# Patient Record
Sex: Female | Born: 1950
Health system: Southern US, Community
[De-identification: ages and names within clinical notes are randomized; demographics above are authoritative.]

## PROBLEM LIST (undated history)

## (undated) DIAGNOSIS — D259 Leiomyoma of uterus, unspecified: Secondary | ICD-10-CM

## (undated) DIAGNOSIS — J45909 Unspecified asthma, uncomplicated: Secondary | ICD-10-CM

## (undated) DIAGNOSIS — D649 Anemia, unspecified: Secondary | ICD-10-CM

## (undated) DIAGNOSIS — N189 Chronic kidney disease, unspecified: Secondary | ICD-10-CM

## (undated) DIAGNOSIS — J45991 Cough variant asthma: Secondary | ICD-10-CM

## (undated) DIAGNOSIS — E119 Type 2 diabetes mellitus without complications: Secondary | ICD-10-CM

## (undated) DIAGNOSIS — M2040 Other hammer toe(s) (acquired), unspecified foot: Secondary | ICD-10-CM

## (undated) HISTORY — DX: Cough variant asthma: J45.991

## (undated) HISTORY — DX: Type 2 diabetes mellitus without complications: E11.9

## (undated) HISTORY — DX: Other hammer toe(s) (acquired), unspecified foot: M20.40

## (undated) HISTORY — DX: Leiomyoma of uterus, unspecified: D25.9

## (undated) HISTORY — DX: Unspecified asthma, uncomplicated: J45.909

## (undated) HISTORY — PX: UTERINE FIBROID SURGERY: SHX826

## (undated) HISTORY — PX: SALIVARY GLAND SURGERY: SHX768

---

## 2009-08-19 ENCOUNTER — Ambulatory Visit: Payer: Self-pay | Admitting: Family Medicine

## 2009-08-19 DIAGNOSIS — R198 Other specified symptoms and signs involving the digestive system and abdomen: Secondary | ICD-10-CM

## 2009-08-19 DIAGNOSIS — L80 Vitiligo: Secondary | ICD-10-CM

## 2009-08-19 DIAGNOSIS — R5383 Other fatigue: Secondary | ICD-10-CM

## 2009-08-19 DIAGNOSIS — R42 Dizziness and giddiness: Secondary | ICD-10-CM | POA: Insufficient documentation

## 2009-08-19 DIAGNOSIS — D492 Neoplasm of unspecified behavior of bone, soft tissue, and skin: Secondary | ICD-10-CM

## 2009-08-19 DIAGNOSIS — IMO0001 Reserved for inherently not codable concepts without codable children: Secondary | ICD-10-CM

## 2009-08-19 DIAGNOSIS — E119 Type 2 diabetes mellitus without complications: Secondary | ICD-10-CM | POA: Insufficient documentation

## 2009-08-19 DIAGNOSIS — R5381 Other malaise: Secondary | ICD-10-CM

## 2009-08-24 ENCOUNTER — Encounter: Payer: Self-pay | Admitting: Physician Assistant

## 2009-08-27 ENCOUNTER — Telehealth: Payer: Self-pay | Admitting: Physician Assistant

## 2009-08-27 LAB — CONVERTED CEMR LAB
Anti Nuclear Antibody(ANA): POSITIVE — AB
BUN: 18 mg/dL (ref 6–23)
CO2: 22 meq/L (ref 19–32)
Calcium: 9.9 mg/dL (ref 8.4–10.5)
Chloride: 110 meq/L (ref 96–112)
Creatinine, Ser: 1.08 mg/dL (ref 0.40–1.20)
Glucose, Bld: 81 mg/dL (ref 70–99)
HDL: 33 mg/dL — ABNORMAL LOW (ref >39)
LDL Cholesterol: 70 mg/dL (ref 0–99)
MCV: 96.9 fL (ref 78.0–100.0)
Platelets: 219 10*3/uL (ref 150–400)
Sodium: 145 meq/L (ref 135–145)
Total Bilirubin: 0.4 mg/dL (ref 0.3–1.2)
Total Protein: 7.5 g/dL (ref 6.0–8.3)
WBC: 5.2 10*3/uL (ref 4.0–10.5)

## 2009-09-01 ENCOUNTER — Telehealth: Payer: Self-pay | Admitting: Family Medicine

## 2009-09-07 ENCOUNTER — Telehealth: Payer: Self-pay | Admitting: Family Medicine

## 2009-09-08 ENCOUNTER — Telehealth: Payer: Self-pay | Admitting: Family Medicine

## 2009-09-08 ENCOUNTER — Encounter: Payer: Self-pay | Admitting: Family Medicine

## 2009-09-14 ENCOUNTER — Ambulatory Visit (HOSPITAL_COMMUNITY): Admission: RE | Admit: 2009-09-14 | Discharge: 2009-09-14 | Payer: Self-pay | Admitting: Family Medicine

## 2009-09-15 ENCOUNTER — Telehealth: Payer: Self-pay | Admitting: Physician Assistant

## 2009-09-15 ENCOUNTER — Encounter: Payer: Self-pay | Admitting: Physician Assistant

## 2009-09-20 ENCOUNTER — Ambulatory Visit (HOSPITAL_COMMUNITY): Admission: RE | Admit: 2009-09-20 | Discharge: 2009-09-20 | Payer: Self-pay | Admitting: Family Medicine

## 2009-09-20 ENCOUNTER — Encounter: Payer: Self-pay | Admitting: Physician Assistant

## 2009-09-23 ENCOUNTER — Telehealth: Payer: Self-pay | Admitting: Family Medicine

## 2009-09-24 ENCOUNTER — Telehealth: Payer: Self-pay | Admitting: Physician Assistant

## 2009-09-25 ENCOUNTER — Telehealth: Payer: Self-pay | Admitting: Physician Assistant

## 2009-09-28 ENCOUNTER — Encounter: Payer: Self-pay | Admitting: Family Medicine

## 2010-01-28 ENCOUNTER — Encounter: Payer: Self-pay | Admitting: Family Medicine

## 2010-03-13 ENCOUNTER — Encounter: Payer: Self-pay | Admitting: Family Medicine

## 2010-03-22 NOTE — Letter (Signed)
Summary: Mri of pelvis  Mri of pelvis   Imported By: Lenn Cal 09/28/2009 16:41:43  _____________________________________________________________________  External Attachment:    Type:   Image     Comment:   External Document

## 2010-03-22 NOTE — Progress Notes (Signed)
Summary: speak with PA  Phone Note Call from Patient   Summary of Call: pt would like to speak with [place]. [id number] Initial call taken by: Lenn Cal,  September 15, 2009 2:24 PM  Follow-up for Phone Call        Pt inquiring why only 2 pages of her information were sent to radiology.  Advised pt that I sent the pathology information because this would be the most helpful, and that I didnt feel that the office notes, etc would be pertinent to the radiologist.  Pt voices that she understood. Follow-up by: Kennith Gain PA,  September 15, 2009 3:45 PM

## 2010-03-22 NOTE — Progress Notes (Signed)
  Phone Note Call from Patient   Summary of Call: This is the patient that Andrea Leblanc has discharged but she was wanting to know if the MRI showed what the lumps above her tailbone might be. Larene Beach and I read the letter to her that [facility] sent regarding the hematomas in her hips but she was wondering about the tailbone places. I didn't see anything about those.  I advised that Arrie Aran would be out until Monday. She was very emotional and crying so I told her I would ask you and see if you knew and call her back. Initial call taken by: Kate Sable LPN,  August  4, 624THL 1:46 PM  Follow-up for Phone Call        the letter sent describes what was seen on the mRI, nothing further to add Follow-up by: Tula Nakayama MD,  September 23, 2009 4:51 PM

## 2010-03-22 NOTE — Progress Notes (Signed)
Summary: referral  Phone Note Call from Patient   Caller: Patient Summary of Call: patient called asking about MRI- states she did not recieve call to tell her it was denied advised patient we were under the impression that she was not coming back to the practice and had been looking for another Dr.  wants call from referal coordinator re this matter  I was able to tell her it was denied, i remember the fax but no copy in chart or notation of call Initial call taken by: Baldomero Lamy LPN,  July 19, 624THL [id number] AM  Follow-up for Phone Call        patient called again, stating that our office needs to call and appeal the mri denial and she wants this done TODAY Follow-up by: Baldomero Lamy LPN,  July 19, 624THL 579FGE PM  Additional Follow-up for Phone Call Additional follow up Details #1::        pls try and follow through on this tomorrow, pls get me on the phone with the insurance company to tryto pA  Additional Follow-up by: Tula Nakayama MD,  September 07, 2009 4:43 PM    Additional Follow-up for Phone Call Additional follow up Details #2::    pt has appt for [place] of hip. She is aware of this.  Follow-up by: Lenn Cal,  September 08, 2009 4:01 PM

## 2010-03-22 NOTE — Progress Notes (Signed)
Summary: please advise  Phone Note Call from Patient   Summary of Call: patient wants to know about the places on her tail bone, she recieved the other letter that states what is going on with her hip, but no mention of the places on her tail bone.  Please advise, she wants a copy of the actual report not just the letter.  she is very upset and states she called yesterday and we talked to her, she is still questioning about the place above her tail bone.  She states she showed these 2 areas to Kennith Gain, on June 30, she states she had to have a sonogram and that the place above her tail bone wasn't mentioned, she went back on Monday to have the MRI.  Please advise, she wants to know where she can get a copy of the report.  she has another doctors appt at 2 today,  she wants a copy of the report to take with her.  She said that she has proof on two occasions where we never mentioned the place above her tail bone  Initial call taken by: Eliezer Mccoy,  September 24, 2009 11:31 AM  Follow-up for Phone Call        [place] appt with ortho in [place], pt wants records of mRI sent to the rheumatologist today.  She at this time is refusing the orthopedic appt, states she is seeing Dr Wolfgang Phoenix next week and wants to sign and have her recordrecords to both places   pt repeatedly complains of service received here Follow-up by: Tula Nakayama MD,  September 24, 2009 12:41 PM  Additional Follow-up for Phone Call Additional follow up Details #1::        Pt should have received a copy of her MRI report with the letter regarding her results.  Additional Follow-up by: Kennith Gain PA,  September 27, 2009 11:08 AM    Additional Follow-up for Phone Call Additional follow up Details #2::    we will wait another call from her, and whenever ewe get a letter requesting records to be sent /fax from an office we will send Follow-up by: Tula Nakayama MD,  September 27, 2009 11:29 AM

## 2010-03-22 NOTE — Progress Notes (Signed)
Summary: referrals  Phone Note Other Incoming   Caller: [facility] Summary of Call: call from doctor reviewing case, recommend ultrasound ogf the mases first on the hip before mRI, also if question is2 episodes of vertigo then therapy for vertigo before furthe eval, if acoutic neuroma, then audiometry recommended Initial call taken by: Tula Nakayama MD,  September 08, 2009 11:11 AM  Follow-up for Phone Call        pls order ultrasound of hip to evaluate masses, cancel mRI, let pt know this has been reviewed with doctor with herinsurance.  pls also let her know therapy forthe verigo is recommended before getting a brain scan as well as evaluation by a hearing specialist, plsrefer her to physical therapy at [facility] eval; and treat vertigo twice weekly for 3 weeks , and also to aaudiology for evel fopr possible acoustic neuroma Follow-up by: Tula Nakayama MD,  September 08, 2009 11:13 AM  Additional Follow-up for Phone Call Additional follow up Details #1::        pt has appt at [facility] for 09/14/2009 9:00. pt notified also pt doesn't wont to do referrals to physical therpy are audiology. Said she has already went to ent and there was nothing wrong. and she stated she has only had vertigo twice here and no need to do that. but she is having the [place].  Additional Follow-up by: Lenn Cal,  September 08, 2009 1:43 PM

## 2010-03-22 NOTE — Progress Notes (Signed)
Summary: lab  Phone Note Call from Patient   Summary of Call: wants results on her lab work call back at [phone] Initial call taken by: Dierdre Harness,  August 27, 2009 10:01 AM  Follow-up for Phone Call        Voice mail.  Left msg that I would call back before I leave for the day. Follow-up by: Kennith Gain PA,  August 27, 2009 11:27 AM  Additional Follow-up for Phone Call Additional follow up Details #1::        Discussed results with pt. She has requested copies of her labs "with comments."  I will have these mailed to her. Will also refer pt to Rheumatologist. Additional Follow-up by: Kennith Gain PA,  August 27, 2009 11:51 AM

## 2010-03-22 NOTE — Letter (Signed)
Summary: MEDICAL RELEASE  MEDICAL RELEASE   Imported By: Dierdre Harness 01/28/2010 11:02:44  _____________________________________________________________________  External Attachment:    Type:   Image     Comment:   External Document

## 2010-03-22 NOTE — Progress Notes (Signed)
  Phone Note Outgoing Call   Call placed by: dr Glessie Eustice Summary of Call: pls contact thios pt and let her knoww  Follow-up for Phone Call        Pls provide this pt withalost of the doctors in [place] or [place] ,  of the internists, she was askingit be sent by [employer], but i do not know about thatsister in law sent a fax and this may be our best option if the pt agrees I treied giving her the names on the phone she wanted something written, we can also mail to her home prob the best Follow-up by: Tula Nakayama MD,  September 01, 2009 2:20 PM  Additional Follow-up for Phone Call Additional follow up Details #1::        mailed list of dr's in [place] with telephone numbers patient aware Additional Follow-up by: Baldomero Lamy LPN,  July 13, 624THL [id number] PM

## 2010-03-22 NOTE — Letter (Signed)
Summary: Discharge Letter  [facility]  [address]   [place], [place] [address]   Phone: [phone]  Fax: [phone]     September 15, 2009 MRN: [id number]   DEONNA HEFFELFINGER [place], [place]  [id number]   Dear Ms. Rosey Bath,  I find it necessary to inform you that I will not be able to provide medical care to you, because of incompatibility of personalities.  You have already expressed interest in finding another primary health care provider, and I feel that we are unable to effectively create a provider-patient relationship.                                                                                             Since your condition requires medical attention, I suggest that you place your self under the care of another physician without delay. If you desire, I will be available for emergency care for 30 days after you receive this letter.  This should give you ample time to select a physician of your choice from the many competent providers in this area. You may want to call the local medical society or [facility]'s physician referral service [phone]) for their assistance in locating a new physician. With your written authorization, I will make a copy of your medical record available to your new physician.   Sincerely,    Kennith Gain PA-C

## 2010-03-22 NOTE — Letter (Signed)
Summary: Laboratory/X-Ray Results  [facility]  [address]   [place], [place] [address]   Phone: [phone]  Fax: [phone]    Lab/X-Ray Results  September 20, 2009  MRN: [id number]  Andrea Leblanc [address] [place], [place]  [id number]    The results of your recent lab/x-ray has been reviewed and were found:  I have enclosed a copy of your MRI results.  As you will see the mass in your hip is a hematoma.  This is a deep bruise.  This will resolve on its own, but can take months. The MRI also shows that you have arthritis in your hips, as well as muscle inflammation or small tears.  This may be due to your connective tissue disorder.  We have referred you to an orthopedic physician for consultation.      Kennith Gain PA

## 2010-03-22 NOTE — Progress Notes (Signed)
  Phone Note Other Incoming   Caller: dr simpson Summary of Call: pt called on 09/24/2009. I spoke woith her directly, and she states that she does not want to be referred to ortho, sh will wait until she sees Dr Sharlee Blew, who she is sched to see one day next week, so pLS ensure that the referral /request you made referring her to ortho is cancelled Initial call taken by: Tula Nakayama MD,  September 25, 2009 11:23 PM  Follow-up for Phone Call        I checked, and the referral had not been done yet. Advised referral coordinator to disregard referral  per pts request. Follow-up by: Kennith Gain PA,  September 27, 2009 11:17 AM

## 2010-03-22 NOTE — Assessment & Plan Note (Signed)
Summary: NEW PATIENT- room 1   Vital Signs:  Patient profile:   60 year old female Height:      63.75 inches Weight:      147.25 pounds BMI:     25.57 O2 Sat:      100 % on Room air Pulse rate:   117 / minute Resp:     16 per minute BP sitting:   112 / 60  (left arm)  Vitals Entered By: Baldomero Lamy LPN (June 30, 624THL 579FGE PM) CC: new patient Is Patient Diabetic? No   CC:  new patient.  History of Present Illness: New pt here to establish care with new PCP.  Relocated to the area from [place] in March of this yr.  Originally from [place].  Is living with her brother and his wife. Presents today with multiple complaints and hx since 2007.  She states she has seen at least 10 Drs and  "needs someone who will listen to me and figure out what is wrong." Saw 2 different rheumatologist in [place].  Pt states she will not tell me what they thought was her dx.  She refuses to tell me. "I cannot accept it."  STates she did have an elevated ANA but when repeated had improved.  Cough variant Asthma.  Treated with Aciphex. DM but improved with diet and wt loss of 100 lbs.  She periodically checks her blood sugar to monitor.  Numbness hands and feet. Vertigo - intermittent.  Has had 2 episodes recently.  Room spinning.  Has to stay in bed because of severity. Change in skin color in rashes. Decreased appetite and digestive issues. Body aches and pains. Constant vag dischg.  Periodic swelling in feet and ankles. Fatigue Occ twitching in lip. New lump in Lt hip and also in buttock area. +TTP. No trauma.  No skin discoloration.   Current Medications (verified): 1)  Piroxicam 20 Mg Caps (Piroxicam) .... One Cap By Mouth Once Daily 2)  Klor-Con 10 10 Meq Cr-Tabs (Potassium Chloride) .... One Tab By Mouth Once Daily 3)  Prednisone 5 Mg Tabs (Prednisone) .... One Tab By Mouth Once Daily 4)  Furosemide 20 Mg Tabs (Furosemide) .... One Tab By Mouth Once Daily 5)  Vitamin D3 1000 Unit Caps  (Cholecalciferol) .... One Cap By Mouth Once Daily  Allergies (verified): No Known Drug Allergies  Past History:  Past medical, surgical, family and social histories (including risk factors) reviewed for relevance to current acute and chronic problems.  Past Medical History: Diabetes mellitus, type II Cough variant asthma Vertigo Skin discoloration  Past Surgical History: Lumpectomy under left ear- benign 2007 Fibroids removed- 2007  Family History: Reviewed history and no changes required. Mother living- healthy Father - health status unknown One brother- healthy One sister deceased-   Social History: Reviewed history and no changes required. Unemployed Divorced No children Never Smoked Alcohol use-no Drug use-no Regular exercise-no Smoking Status:  never Drug Use:  no Does Patient Exercise:  no  Review of Systems General:  Complains of fatigue and loss of appetite; denies chills. ENT:  Denies earache, nasal congestion, ringing in ears, and sore throat. CV:  Denies chest pain or discomfort and palpitations. Resp:  Complains of shortness of breath; denies cough and wheezing; FEELS LIKE DOESNT GET A FULL BREATH, LIKE HER LUNGS DONT EXPAND ALL THE WAY. GI:  Complains of change in bowel habits, constipation, diarrhea, and loss of appetite. MS:  Complains of muscle aches and muscle weakness. Derm:  Complains of changes in color of skin, dryness, lesion(s), poor wound healing, and rash. Neuro:  Complains of numbness and tingling.  Physical Exam  General:  Well-developed,well-nourished,in no acute distress; alert,appropriate and cooperative throughout examination Head:  Normocephalic and atraumatic without obvious abnormalities. No apparent alopecia or balding. Ears:  External ear exam shows no significant lesions or deformities.  Otoscopic examination reveals clear canals, tympanic membranes are intact bilaterally without bulging, retraction, inflammation or discharge.  Hearing is grossly normal bilaterally. Nose:  External nasal examination shows no deformity or inflammation. Nasal mucosa are pink and moist without lesions or exudates. Mouth:  Oral mucosa and oropharynx without lesions or exudates.  Teeth in good repair. Neck:  No deformities, masses, or tenderness noted.no thyromegaly and no thyroid nodules or tenderness.   Lungs:  Normal respiratory effort, chest expands symmetrically. Lungs are clear to auscultation, no crackles or wheezes. Heart:  Normal rate and regular rhythm. S1 and S2 normal without gallop, murmur, click, rub or other extra sounds. Abdomen:  Bowel sounds positive,abdomen soft and non-tender without masses, organomegaly or hernias noted. Msk:  approx 2 inch mass, firm, mobile posterolateral Lt hip.  2 smaller similar masses noted buttocks at superior gluteal cleft. Extremities:  No clubbing, cyanosis, edema, or deformity noted with normal full range of motion of all joints.   Neurologic:  alert & oriented X3, strength normal in all extremities, gait normal, and DTRs symmetrical and normal.   Skin:  hypopigmentation noted  Cervical Nodes:  No lymphadenopathy noted Psych:  Cognition and judgment appear intact. Alert and cooperative with normal attention span and concentration. No apparent delusions, illusions, hallucinations   Impression & Recommendations:  Problem # 1:  VERTIGO (ICD-780.4) Assessment New  Her updated medication list for this problem includes:    Antivert 25 Mg Tabs (Meclizine hcl) .Marland Kitchen... Take 1 every 8 hrs as needed for dizziness  Orders: Miscellaneous Other Radiology (Misc Other Rad) - MRI Brain and 8th cranial nerve  Problem # 2:  MYALGIA (ICD-729.1) Assessment: Deteriorated  Her updated medication list for this problem includes:    Piroxicam 20 Mg Caps (Piroxicam) ..... One cap by mouth once daily  Orders: Antinuclear Antib (ANA) [phone]) T-Antinuclear Antib (ANA) [phone])  Problem # 3:  HIP  MASS (ICD-239.2) Assessment: New  Orders: T-MRI Lower extremity other than joint w/o contrast [id number])  Problem # 4:  FATIGUE (ICD-780.79) Assessment: Deteriorated  Orders: T-CBC No Diff [id number]) T-TSH [id number])  Problem # 5:  DIABETES MELLITUS, TYPE II (ICD-250.00) Assessment: Improved  Orders: T-CMP with estimated GFR ([phone]) T- Hemoglobin A1C [id number])  Complete Medication List: 1)  Piroxicam 20 Mg Caps (Piroxicam) .... One cap by mouth once daily 2)  Klor-con 10 10 Meq Cr-tabs (Potassium chloride) .... One tab by mouth once daily 3)  Prednisone 5 Mg Tabs (Prednisone) .... One tab by mouth once daily 4)  Furosemide 20 Mg Tabs (Furosemide) .... One tab by mouth once daily 5)  Vitamin D3 1000 Unit Caps (Cholecalciferol) .... One cap by mouth once daily 6)  Antivert 25 Mg Tabs (Meclizine hcl) .... Take 1 every 8 hrs as needed for dizziness  Other Orders: T-Lipid Profile [id number]) Gastroenterology Referral (GI) Dermatology Referral (Derma)  Patient Instructions: 1)  Please schedule a follow-up appointment in 1 month. 2)  I have ordered blood work for you.  Please have this drawn fasting. 3)  I will order a MRI of your head and of the mass of your Lt hip. 4)  I have referred  you to a dermatologist for evaluation of your skin. 5)  I will review your records and then refer you to a rhematologist. 6)  I have prescribed Antivert for you to use as needed for dizziness. Prescriptions: ANTIVERT 25 MG TABS (MECLIZINE HCL) take 1 every 8 hrs as needed for dizziness  #30 x 0   Entered and Authorized by:   Kennith Gain PA   Signed by:   Kennith Gain PA on 08/19/2009   Method used:   Electronically to        [facility] (retail)       [address]       [place], [place]  [address]       Ph: [phone]       Fax: [phone]   RxID:   [id number]

## 2011-03-07 ENCOUNTER — Other Ambulatory Visit: Payer: Self-pay | Admitting: Family Medicine

## 2011-03-07 DIAGNOSIS — Z139 Encounter for screening, unspecified: Secondary | ICD-10-CM

## 2011-03-10 ENCOUNTER — Ambulatory Visit (HOSPITAL_COMMUNITY)
Admission: RE | Admit: 2011-03-10 | Discharge: 2011-03-10 | Disposition: A | Discharge status: Home / Self Care | Payer: PRIVATE HEALTH INSURANCE | Source: Ambulatory Visit | Attending: Family Medicine | Admitting: Family Medicine

## 2011-03-10 DIAGNOSIS — M949 Disorder of cartilage, unspecified: Secondary | ICD-10-CM | POA: Insufficient documentation

## 2011-03-10 DIAGNOSIS — M899 Disorder of bone, unspecified: Secondary | ICD-10-CM | POA: Insufficient documentation

## 2011-03-10 DIAGNOSIS — Z139 Encounter for screening, unspecified: Secondary | ICD-10-CM

## 2011-03-10 DIAGNOSIS — Z780 Asymptomatic menopausal state: Secondary | ICD-10-CM | POA: Insufficient documentation

## 2011-03-10 DIAGNOSIS — Z1231 Encounter for screening mammogram for malignant neoplasm of breast: Secondary | ICD-10-CM | POA: Insufficient documentation

## 2011-06-14 ENCOUNTER — Other Ambulatory Visit: Payer: Self-pay | Admitting: Neurology

## 2011-06-14 DIAGNOSIS — R202 Paresthesia of skin: Secondary | ICD-10-CM

## 2011-06-14 DIAGNOSIS — R42 Dizziness and giddiness: Secondary | ICD-10-CM

## 2011-06-21 ENCOUNTER — Ambulatory Visit (HOSPITAL_COMMUNITY)
Admission: RE | Admit: 2011-06-21 | Discharge: 2011-06-21 | Disposition: A | Discharge status: Home / Self Care | Payer: PRIVATE HEALTH INSURANCE | Source: Ambulatory Visit | Attending: Neurology | Admitting: Neurology

## 2011-06-21 DIAGNOSIS — R262 Difficulty in walking, not elsewhere classified: Secondary | ICD-10-CM | POA: Insufficient documentation

## 2011-06-21 DIAGNOSIS — R42 Dizziness and giddiness: Secondary | ICD-10-CM | POA: Insufficient documentation

## 2011-06-21 DIAGNOSIS — R209 Unspecified disturbances of skin sensation: Secondary | ICD-10-CM | POA: Insufficient documentation

## 2011-06-21 DIAGNOSIS — R200 Anesthesia of skin: Secondary | ICD-10-CM

## 2012-05-13 ENCOUNTER — Other Ambulatory Visit: Payer: Self-pay

## 2012-05-13 MED ORDER — TRAMADOL HCL 50 MG PO TABS: ORAL_TABLET | ORAL | Status: DC

## 2012-09-12 ENCOUNTER — Telehealth: Payer: Self-pay | Admitting: Family Medicine

## 2012-09-12 MED ORDER — TRAMADOL HCL 50 MG PO TABS: ORAL_TABLET | ORAL | Status: DC

## 2012-09-12 NOTE — Telephone Encounter (Signed)
Refill times one, needs follow up ov before another refill(after this one)

## 2012-09-12 NOTE — Telephone Encounter (Signed)
Patient needs Rx for tramadol 50 mg to [facility] in [place]

## 2012-09-12 NOTE — Telephone Encounter (Signed)
Med called into pharm. Pt notified on voicemail

## 2012-09-20 ENCOUNTER — Encounter: Payer: Self-pay | Admitting: Family Medicine

## 2012-09-20 ENCOUNTER — Ambulatory Visit (INDEPENDENT_AMBULATORY_CARE_PROVIDER_SITE_OTHER): Payer: Medicare Other | Admitting: Family Medicine

## 2012-09-20 VITALS — BP 132/90 | Temp 98.3°F | Wt 183.8 lb

## 2012-09-20 DIAGNOSIS — R21 Rash and other nonspecific skin eruption: Secondary | ICD-10-CM

## 2012-09-20 DIAGNOSIS — G894 Chronic pain syndrome: Secondary | ICD-10-CM

## 2012-09-20 DIAGNOSIS — E119 Type 2 diabetes mellitus without complications: Secondary | ICD-10-CM

## 2012-09-20 LAB — POCT GLYCOSYLATED HEMOGLOBIN (HGB A1C): Hemoglobin A1C: 6

## 2012-09-20 MED ORDER — MOMETASONE FUROATE 0.1 % EX CREA: TOPICAL_CREAM | CUTANEOUS | Status: DC

## 2012-09-20 MED ORDER — TRAMADOL HCL 50 MG PO TABS: ORAL_TABLET | ORAL | Status: DC

## 2012-09-20 NOTE — Progress Notes (Signed)
  Subjective:    Patient ID: Andrea Leblanc, female    DOB: 26-Jul-1950, 62 y.o.   MRN: [id number]  HPI Uses the tramdol twice per day, takes 2 tablets twice per day.  Patient states that she has never had diabetes. When she first presented she stated that she had diabetes. Her sugar numbers were apparently elevated while she was on steroids.  The patient goes into great length today once again on her disappointment with virtually every doctor that she has ever seen. She is going to yet another specialist soon. She does have significant difficulties with chronic arthritis and may or may not have a true autoimmune inflammatory disease. She has been told in the past that she has possible lupus. She has also been told in the past that she does not have lupus. Understandably she is frustrated about this.  Patient also notes rash Review of Systems    ROS otherwise negative Objective:   Physical Exam Alert no acute distress. Lungs clear. Heart regular rate and rhythm. HEENT normal. Face arms hypertrophic patchy rash.       Assessment & Plan:  Impression chronic rheumatological problem with no specific diagnosis from rheumatologist and extreme ongoing frustration on the part of the patient because of this #2 history of elevated sugar but no diabetes per patient #3 rash nonspecific but may be related to #1 #4 chronic pain discussed plan medications refilled. Add Elocon. Patient encouraged to get on and see a new rheumatologist at [facility]. Also encouraged to get wellness exam soon. WSL

## 2012-09-21 DIAGNOSIS — G894 Chronic pain syndrome: Secondary | ICD-10-CM | POA: Insufficient documentation

## 2012-10-25 ENCOUNTER — Telehealth: Payer: Self-pay | Admitting: Family Medicine

## 2012-10-25 NOTE — Telephone Encounter (Signed)
Would like an exemption letter for [employer].  States she will not be able to attend jury duty due to health reason.  Needs this letter before Monday November 11, 2012.  Patient has a deadline to return this exemption to the [employer].  Please call Patient. Thanks

## 2012-11-01 NOTE — Telephone Encounter (Signed)
Do we know if this has been completed?  May I close the encounter?

## 2012-11-07 NOTE — Telephone Encounter (Signed)
Letter mailed 11/07/2012

## 2012-11-28 ENCOUNTER — Encounter: Payer: Medicare Other | Admitting: Nurse Practitioner

## 2012-12-10 ENCOUNTER — Telehealth: Payer: Self-pay | Admitting: Family Medicine

## 2012-12-10 NOTE — Telephone Encounter (Signed)
Pt is often compromised, somewhat angry, and scattered in her requests when I interact with her. Nice lady, but appears somewhat compromised in her thought processies. When I last saw, she mentioned she'd be seeing a specialist at [facility] that's all . Have beth see this and perhaps call her

## 2012-12-10 NOTE — Telephone Encounter (Signed)
Patient called wanting her A1C information from august1  And any other results from last year. I gave them to her.She wanted to know if we send out letters on this I told her I would find out. I addressed this with the nurse's they told me we dont the result are given when patient is here.Then she ask me about records going to [facility] I tried to explain to her I never received a release on this so couldn't send records where I dont know where they are going. Patient got very upset and wanting to know why this wasn't done. I explained if i dont get the request and cant mail or fax records any where. She got mad and started yelling I whant to speak to the doctor now.I explained I would send back a message to you but I cant fax/mail request that I dont get and I want here that afternoon she checked out.

## 2012-12-19 ENCOUNTER — Telehealth: Payer: Self-pay | Admitting: Family Medicine

## 2012-12-19 MED ORDER — TRAMADOL HCL 50 MG PO TABS: ORAL_TABLET | ORAL | Status: DC

## 2012-12-19 NOTE — Telephone Encounter (Signed)
Rx faxed to [facility]. Patient notified.

## 2012-12-19 NOTE — Telephone Encounter (Signed)
Patient states she is out of traMADol (ULTRAM) 50 MG tablet.  Called [facility] in [place], [place] to get this filled and was informed she did not have a prescription for this on file.  However after talking further with the patient she only went through the Automated system.   Call patient to discuss.  She was going to call [facility] and speak with a person to see if this prescription is on "File".  She was trying to refill under the previous RX number from another physician.

## 2012-12-19 NOTE — Telephone Encounter (Signed)
2 tabs twice a day #120

## 2012-12-19 NOTE — Telephone Encounter (Signed)
Please verify usage, number per day

## 2012-12-19 NOTE — Telephone Encounter (Signed)
May have 3 refills 

## 2012-12-20 ENCOUNTER — Telehealth: Payer: Self-pay | Admitting: Family Medicine

## 2012-12-20 NOTE — Telephone Encounter (Signed)
Notified patient that if her condition has deteriorated and she felt she needed different pain meds that she would need a office visit for evaluation and discussion-Patient said ok and hung up.

## 2012-12-20 NOTE — Telephone Encounter (Signed)
Pt wants to know if she can have a different pain med instead of the Tramadol? She is now stating that the Tramadol does not really help her pain. She states that one of her Drs from the past had issued her prednisone for her pain and she states that she felt at her best then. She says she is not saying she needs prednisone, she is just giving you info. She feels her condition has deteriorated to a point that she is now using a walker, or can't even get out of bed due to the pain in her legs.

## 2013-04-23 ENCOUNTER — Ambulatory Visit: Payer: Medicare Other | Admitting: Podiatry

## 2013-04-23 ENCOUNTER — Encounter: Payer: Self-pay | Admitting: Podiatry

## 2013-04-23 ENCOUNTER — Ambulatory Visit (INDEPENDENT_AMBULATORY_CARE_PROVIDER_SITE_OTHER): Payer: Medicare HMO | Admitting: Podiatry

## 2013-04-23 VITALS — BP 147/97 | HR 100 | Resp 12

## 2013-04-23 DIAGNOSIS — L84 Corns and callosities: Secondary | ICD-10-CM

## 2013-04-23 DIAGNOSIS — R52 Pain, unspecified: Secondary | ICD-10-CM

## 2013-04-23 DIAGNOSIS — M2040 Other hammer toe(s) (acquired), unspecified foot: Secondary | ICD-10-CM

## 2013-04-23 NOTE — Patient Instructions (Signed)
Wear soft or cut out shoe over the second toe on the left foot. Apply triple antibiotic ointment and nonmedicated corn pad to the second left toe and attach with one-inch Coflex tape. Do not over tighten Coflex tape. Continue this procedure until the toe heals.

## 2013-04-23 NOTE — Progress Notes (Signed)
   Subjective:    Patient ID: Andrea Leblanc, female    DOB: [birth year], 63 y.o.   MRN: [id number]  HPI '' B/L TOES HAVE NUMBNESS FEELING AND CONSTANT JERKING FOR 3 MONTHS. THE SYMPTOMS HAVE DIMINISHED SLIGHTLY AND IT AGGRAVATED WHEN SITTING OR WALKING. ALSO, LT FOOT 2nd  TOE IS SORE, ESPECIALLY WEARING SHOES AND IS NOT GETTING BETTER.  This patient states that she's been to [facility] recently under the evaluation of multiple doctors for rather diffuse undiagnosed bilaterally pain, without a specific diagnosis. Her primary concern today is the inability to tolerate a closed shoe overlying the second left toe.       Review of Systems  Musculoskeletal: Positive for gait problem.  Neurological: Positive for numbness.  All other systems reviewed and are negative.       Objective:   Physical Exam A 63 year old black female appears orientated x3  Vascular: The DP and PT pulses 2/4 bilaterally  Neurological: Sensation to 10 g monofilament wire intact 5/5 bilaterally. Vibratory sensation intact bilaterally.  Dermatological: A inflamed hyperkeratotic tissue noted over the dorsal proximal interphalangeal joint second left toe which is very tender to pressure. (After debridement hyperkeratotic tissue on the second left toe a granular base is noted without any active drainage, erythema, malodor).  Musculoskeletal: Semirigid hammer second digit left       Assessment & Plan:   Assessment: Undetermined cause of patient's numbness. Patient is under evaluation by multiple doctors at [facility] for this complaint  Hammertoe deformity second digit left with associated inflammatory reactive area on the dorsal proximal interphalangeal joint.  Plan: Patient advised to continue further evaluation for her generalized numbness and bilaterally pain with her Evangeline.  The hyperkeratotic tissue in the second left toe was debrided back and antibiotic protective dressing applied to the  second left toe. I recommended that she wear a soft or cut out shoe over the second left toe. In addition, I recommend triple antibiotic ointment and a nonmedicated corn pad applied to the second left toe daily, attach with Coflex tape until the toe heals.  Reappoint at patient's request the

## 2013-05-20 ENCOUNTER — Telehealth: Payer: Self-pay | Admitting: *Deleted

## 2013-05-20 NOTE — Telephone Encounter (Signed)
Patient called again today.  I informed her Dr. Amalia Hailey is out of the office today.  I also informed her that he no longer does surgery. I informed her he may refer her to another doctor within the practice.  She asked what causes Hammer Toes.  I informed her most of the time they are hereditary.  She asked how do they treat them.  I explained to her that a piece of bone is removed from the area where the corn is.  She asked what takes the place of the bone.  I explained to her that tissues will fill in that area.  I informed  her that all this will be explained when and if she has a consult.  She said she doesn't want to pay another $40 co-pay and have nothing done.  She stated she wanted to check with her insurance company about the procedure.  I gave her the [facility] code [phone].  She asked if we do the procedure in the office.  I told her no, we do them at [facility].  She asked that I let her know what Dr. Amalia Hailey recommends for her.

## 2013-05-20 NOTE — Telephone Encounter (Signed)
I saw Dr. Amalia Hailey top of this month. He determined I have a Hammer Toe.  What if any surgical procedure does he recommend.  It's not getting any better, rubs my shoe.

## 2013-05-26 NOTE — Telephone Encounter (Signed)
I called and informed her that Dr. Amalia Hailey wants to refer her to Dr. Blenda Mounts.  She said she wants to hold off at this time.  She has an appointment coming up at [facility] for some other health issues that she wants to address first.  She said she'll call at a later date.

## 2013-05-26 NOTE — Telephone Encounter (Signed)
Message copied by Lolita Rieger on Mon May 26, 2013  8:50 AM ------      Message from: Gean Birchwood      Created: Thu May 22, 2013 10:27 AM       Patient has a complex medical history which would require further evaluation prior to offering surgical intervention.            She would need to present to the office for further evaluation.      If she request referral I would suggest Dr. Blenda Mounts. ------

## 2013-06-16 ENCOUNTER — Telehealth: Payer: Self-pay | Admitting: Family Medicine

## 2013-06-16 DIAGNOSIS — R202 Paresthesia of skin: Principal | ICD-10-CM

## 2013-06-16 DIAGNOSIS — R200 Anesthesia of skin: Secondary | ICD-10-CM

## 2013-06-16 NOTE — Telephone Encounter (Signed)
Pt requesting referral to Dr. Lawrence Marseilles, Neurology in [place], for her bilat foot pain, numbness, & jerking of her feet & toes.  Transferring care from [place] due to the drive down there & the docs there not seeming to help much & sees a different one each time.  Would like new doc to try to find out problem and how to get it fixed, no one she's seen up to this point has been able to help or explain things very well to her, she's frustrated  Ph# [phone], Fx# [phone], [address], [place], [place]  She was given a "tentative" appointment for 07/10/13 @ 11:00am providing they get a referral from our office   If "OK" please initiate referral in system so that I may process

## 2013-06-16 NOTE — Telephone Encounter (Signed)
Amazing switching of specialists for this lady. Sure, go ahead

## 2014-12-28 ENCOUNTER — Encounter: Payer: Self-pay | Admitting: *Deleted

## 2015-01-11 ENCOUNTER — Ambulatory Visit: Payer: Medicare HMO | Admitting: Neurology

## 2015-01-13 ENCOUNTER — Ambulatory Visit: Payer: Medicare HMO | Admitting: Neurology

## 2015-02-26 DIAGNOSIS — M329 Systemic lupus erythematosus, unspecified: Secondary | ICD-10-CM | POA: Diagnosis not present

## 2015-02-26 DIAGNOSIS — M0579 Rheumatoid arthritis with rheumatoid factor of multiple sites without organ or systems involvement: Secondary | ICD-10-CM | POA: Diagnosis not present

## 2015-03-12 DIAGNOSIS — M79672 Pain in left foot: Secondary | ICD-10-CM | POA: Diagnosis not present

## 2015-03-12 DIAGNOSIS — M79671 Pain in right foot: Secondary | ICD-10-CM | POA: Diagnosis not present

## 2015-03-12 DIAGNOSIS — M329 Systemic lupus erythematosus, unspecified: Secondary | ICD-10-CM | POA: Diagnosis not present

## 2015-03-12 DIAGNOSIS — G959 Disease of spinal cord, unspecified: Secondary | ICD-10-CM | POA: Diagnosis not present

## 2015-03-12 DIAGNOSIS — G629 Polyneuropathy, unspecified: Secondary | ICD-10-CM | POA: Diagnosis not present

## 2015-03-31 DIAGNOSIS — M4696 Unspecified inflammatory spondylopathy, lumbar region: Secondary | ICD-10-CM | POA: Diagnosis not present

## 2015-03-31 DIAGNOSIS — M4712 Other spondylosis with myelopathy, cervical region: Secondary | ICD-10-CM | POA: Diagnosis not present

## 2015-03-31 DIAGNOSIS — I998 Other disorder of circulatory system: Secondary | ICD-10-CM | POA: Diagnosis not present

## 2015-03-31 DIAGNOSIS — M5136 Other intervertebral disc degeneration, lumbar region: Secondary | ICD-10-CM | POA: Diagnosis not present

## 2015-03-31 DIAGNOSIS — M47812 Spondylosis without myelopathy or radiculopathy, cervical region: Secondary | ICD-10-CM | POA: Diagnosis not present

## 2015-03-31 DIAGNOSIS — G959 Disease of spinal cord, unspecified: Secondary | ICD-10-CM | POA: Diagnosis not present

## 2015-04-13 DIAGNOSIS — G458 Other transient cerebral ischemic attacks and related syndromes: Secondary | ICD-10-CM | POA: Diagnosis not present

## 2015-04-13 DIAGNOSIS — G629 Polyneuropathy, unspecified: Secondary | ICD-10-CM | POA: Diagnosis not present

## 2015-04-13 DIAGNOSIS — Z6829 Body mass index (BMI) 29.0-29.9, adult: Secondary | ICD-10-CM | POA: Diagnosis not present

## 2015-04-13 DIAGNOSIS — I5021 Acute systolic (congestive) heart failure: Secondary | ICD-10-CM | POA: Diagnosis not present

## 2015-04-13 DIAGNOSIS — I10 Essential (primary) hypertension: Secondary | ICD-10-CM | POA: Diagnosis not present

## 2015-04-14 DIAGNOSIS — I10 Essential (primary) hypertension: Secondary | ICD-10-CM | POA: Diagnosis not present

## 2015-04-14 DIAGNOSIS — I5021 Acute systolic (congestive) heart failure: Secondary | ICD-10-CM | POA: Diagnosis not present

## 2015-04-14 DIAGNOSIS — R5383 Other fatigue: Secondary | ICD-10-CM | POA: Diagnosis not present

## 2015-04-19 DIAGNOSIS — G459 Transient cerebral ischemic attack, unspecified: Secondary | ICD-10-CM | POA: Diagnosis not present

## 2015-04-19 DIAGNOSIS — G458 Other transient cerebral ischemic attacks and related syndromes: Secondary | ICD-10-CM | POA: Diagnosis not present

## 2015-04-27 DIAGNOSIS — B369 Superficial mycosis, unspecified: Secondary | ICD-10-CM | POA: Diagnosis not present

## 2015-04-27 DIAGNOSIS — G629 Polyneuropathy, unspecified: Secondary | ICD-10-CM | POA: Diagnosis not present

## 2015-05-17 DIAGNOSIS — Z6829 Body mass index (BMI) 29.0-29.9, adult: Secondary | ICD-10-CM | POA: Diagnosis not present

## 2015-05-17 DIAGNOSIS — R42 Dizziness and giddiness: Secondary | ICD-10-CM | POA: Diagnosis not present

## 2015-05-21 DIAGNOSIS — R42 Dizziness and giddiness: Secondary | ICD-10-CM | POA: Diagnosis not present

## 2015-05-21 DIAGNOSIS — Z79899 Other long term (current) drug therapy: Secondary | ICD-10-CM | POA: Diagnosis not present

## 2015-05-21 DIAGNOSIS — Z131 Encounter for screening for diabetes mellitus: Secondary | ICD-10-CM | POA: Diagnosis not present

## 2015-06-15 DIAGNOSIS — M329 Systemic lupus erythematosus, unspecified: Secondary | ICD-10-CM | POA: Diagnosis not present

## 2015-06-15 DIAGNOSIS — R531 Weakness: Secondary | ICD-10-CM | POA: Diagnosis not present

## 2015-06-15 DIAGNOSIS — J189 Pneumonia, unspecified organism: Secondary | ICD-10-CM | POA: Diagnosis not present

## 2015-06-15 DIAGNOSIS — Z6828 Body mass index (BMI) 28.0-28.9, adult: Secondary | ICD-10-CM | POA: Diagnosis not present

## 2015-06-15 DIAGNOSIS — K449 Diaphragmatic hernia without obstruction or gangrene: Secondary | ICD-10-CM | POA: Diagnosis not present

## 2015-06-15 DIAGNOSIS — R000 Tachycardia, unspecified: Secondary | ICD-10-CM | POA: Diagnosis not present

## 2015-06-15 DIAGNOSIS — E1141 Type 2 diabetes mellitus with diabetic mononeuropathy: Secondary | ICD-10-CM | POA: Diagnosis not present

## 2015-06-15 DIAGNOSIS — M328 Other forms of systemic lupus erythematosus: Secondary | ICD-10-CM | POA: Diagnosis not present

## 2015-06-15 DIAGNOSIS — R404 Transient alteration of awareness: Secondary | ICD-10-CM | POA: Diagnosis not present

## 2015-06-15 DIAGNOSIS — D638 Anemia in other chronic diseases classified elsewhere: Secondary | ICD-10-CM | POA: Diagnosis not present

## 2015-06-15 DIAGNOSIS — J168 Pneumonia due to other specified infectious organisms: Secondary | ICD-10-CM | POA: Diagnosis not present

## 2015-06-15 DIAGNOSIS — N183 Chronic kidney disease, stage 3 (moderate): Secondary | ICD-10-CM | POA: Diagnosis not present

## 2015-06-15 DIAGNOSIS — E1122 Type 2 diabetes mellitus with diabetic chronic kidney disease: Secondary | ICD-10-CM | POA: Diagnosis not present

## 2015-06-15 DIAGNOSIS — E1140 Type 2 diabetes mellitus with diabetic neuropathy, unspecified: Secondary | ICD-10-CM | POA: Diagnosis not present

## 2015-06-15 DIAGNOSIS — E440 Moderate protein-calorie malnutrition: Secondary | ICD-10-CM | POA: Diagnosis not present

## 2015-06-15 DIAGNOSIS — Z780 Asymptomatic menopausal state: Secondary | ICD-10-CM | POA: Diagnosis not present

## 2015-06-19 DIAGNOSIS — Z780 Asymptomatic menopausal state: Secondary | ICD-10-CM | POA: Diagnosis not present

## 2015-06-19 DIAGNOSIS — E1122 Type 2 diabetes mellitus with diabetic chronic kidney disease: Secondary | ICD-10-CM | POA: Diagnosis not present

## 2015-06-19 DIAGNOSIS — E440 Moderate protein-calorie malnutrition: Secondary | ICD-10-CM | POA: Diagnosis not present

## 2015-06-19 DIAGNOSIS — J168 Pneumonia due to other specified infectious organisms: Secondary | ICD-10-CM | POA: Diagnosis not present

## 2015-06-19 DIAGNOSIS — N183 Chronic kidney disease, stage 3 (moderate): Secondary | ICD-10-CM | POA: Diagnosis not present

## 2015-06-19 DIAGNOSIS — R279 Unspecified lack of coordination: Secondary | ICD-10-CM | POA: Diagnosis not present

## 2015-06-19 DIAGNOSIS — M328 Other forms of systemic lupus erythematosus: Secondary | ICD-10-CM | POA: Diagnosis not present

## 2015-06-19 DIAGNOSIS — E1140 Type 2 diabetes mellitus with diabetic neuropathy, unspecified: Secondary | ICD-10-CM | POA: Diagnosis not present

## 2015-06-19 DIAGNOSIS — E1141 Type 2 diabetes mellitus with diabetic mononeuropathy: Secondary | ICD-10-CM | POA: Diagnosis not present

## 2015-06-19 DIAGNOSIS — M6281 Muscle weakness (generalized): Secondary | ICD-10-CM | POA: Diagnosis not present

## 2015-06-19 DIAGNOSIS — Z743 Need for continuous supervision: Secondary | ICD-10-CM | POA: Diagnosis not present

## 2015-06-19 DIAGNOSIS — J189 Pneumonia, unspecified organism: Secondary | ICD-10-CM | POA: Diagnosis not present

## 2015-06-19 DIAGNOSIS — R2689 Other abnormalities of gait and mobility: Secondary | ICD-10-CM | POA: Diagnosis not present

## 2015-06-19 DIAGNOSIS — D638 Anemia in other chronic diseases classified elsewhere: Secondary | ICD-10-CM | POA: Diagnosis not present

## 2015-06-19 DIAGNOSIS — M329 Systemic lupus erythematosus, unspecified: Secondary | ICD-10-CM | POA: Diagnosis not present

## 2015-06-21 DIAGNOSIS — R2689 Other abnormalities of gait and mobility: Secondary | ICD-10-CM | POA: Diagnosis not present

## 2015-06-21 DIAGNOSIS — D638 Anemia in other chronic diseases classified elsewhere: Secondary | ICD-10-CM | POA: Diagnosis not present

## 2015-06-21 DIAGNOSIS — J189 Pneumonia, unspecified organism: Secondary | ICD-10-CM | POA: Diagnosis not present

## 2015-06-21 DIAGNOSIS — E1140 Type 2 diabetes mellitus with diabetic neuropathy, unspecified: Secondary | ICD-10-CM | POA: Diagnosis not present

## 2015-06-21 DIAGNOSIS — M6281 Muscle weakness (generalized): Secondary | ICD-10-CM | POA: Diagnosis not present

## 2015-06-21 DIAGNOSIS — Z780 Asymptomatic menopausal state: Secondary | ICD-10-CM | POA: Diagnosis not present

## 2015-06-21 DIAGNOSIS — E440 Moderate protein-calorie malnutrition: Secondary | ICD-10-CM | POA: Diagnosis not present

## 2015-06-21 DIAGNOSIS — E1122 Type 2 diabetes mellitus with diabetic chronic kidney disease: Secondary | ICD-10-CM | POA: Diagnosis not present

## 2015-06-21 DIAGNOSIS — M329 Systemic lupus erythematosus, unspecified: Secondary | ICD-10-CM | POA: Diagnosis not present

## 2015-06-21 DIAGNOSIS — N183 Chronic kidney disease, stage 3 (moderate): Secondary | ICD-10-CM | POA: Diagnosis not present

## 2015-07-22 DIAGNOSIS — E1122 Type 2 diabetes mellitus with diabetic chronic kidney disease: Secondary | ICD-10-CM | POA: Diagnosis not present

## 2015-07-22 DIAGNOSIS — Z780 Asymptomatic menopausal state: Secondary | ICD-10-CM | POA: Diagnosis not present

## 2015-07-22 DIAGNOSIS — R2689 Other abnormalities of gait and mobility: Secondary | ICD-10-CM | POA: Diagnosis not present

## 2015-07-22 DIAGNOSIS — R269 Unspecified abnormalities of gait and mobility: Secondary | ICD-10-CM | POA: Diagnosis not present

## 2015-07-22 DIAGNOSIS — J189 Pneumonia, unspecified organism: Secondary | ICD-10-CM | POA: Diagnosis not present

## 2015-07-22 DIAGNOSIS — N183 Chronic kidney disease, stage 3 (moderate): Secondary | ICD-10-CM | POA: Diagnosis not present

## 2015-07-22 DIAGNOSIS — E440 Moderate protein-calorie malnutrition: Secondary | ICD-10-CM | POA: Diagnosis not present

## 2015-07-22 DIAGNOSIS — M329 Systemic lupus erythematosus, unspecified: Secondary | ICD-10-CM | POA: Diagnosis not present

## 2015-07-22 DIAGNOSIS — M6281 Muscle weakness (generalized): Secondary | ICD-10-CM | POA: Diagnosis not present

## 2015-07-22 DIAGNOSIS — D638 Anemia in other chronic diseases classified elsewhere: Secondary | ICD-10-CM | POA: Diagnosis not present

## 2015-07-22 DIAGNOSIS — E1140 Type 2 diabetes mellitus with diabetic neuropathy, unspecified: Secondary | ICD-10-CM | POA: Diagnosis not present

## 2015-07-26 DIAGNOSIS — Z8701 Personal history of pneumonia (recurrent): Secondary | ICD-10-CM | POA: Diagnosis not present

## 2015-07-26 DIAGNOSIS — E1122 Type 2 diabetes mellitus with diabetic chronic kidney disease: Secondary | ICD-10-CM | POA: Diagnosis not present

## 2015-07-26 DIAGNOSIS — Z7984 Long term (current) use of oral hypoglycemic drugs: Secondary | ICD-10-CM | POA: Diagnosis not present

## 2015-07-26 DIAGNOSIS — G609 Hereditary and idiopathic neuropathy, unspecified: Secondary | ICD-10-CM | POA: Diagnosis not present

## 2015-07-26 DIAGNOSIS — M6281 Muscle weakness (generalized): Secondary | ICD-10-CM | POA: Diagnosis not present

## 2015-07-26 DIAGNOSIS — N183 Chronic kidney disease, stage 3 (moderate): Secondary | ICD-10-CM | POA: Diagnosis not present

## 2015-07-26 DIAGNOSIS — M329 Systemic lupus erythematosus, unspecified: Secondary | ICD-10-CM | POA: Diagnosis not present

## 2015-07-26 DIAGNOSIS — R2689 Other abnormalities of gait and mobility: Secondary | ICD-10-CM | POA: Diagnosis not present

## 2015-07-28 DIAGNOSIS — R2689 Other abnormalities of gait and mobility: Secondary | ICD-10-CM | POA: Diagnosis not present

## 2015-07-28 DIAGNOSIS — M6281 Muscle weakness (generalized): Secondary | ICD-10-CM | POA: Diagnosis not present

## 2015-07-28 DIAGNOSIS — D638 Anemia in other chronic diseases classified elsewhere: Secondary | ICD-10-CM | POA: Diagnosis not present

## 2015-07-28 DIAGNOSIS — J189 Pneumonia, unspecified organism: Secondary | ICD-10-CM | POA: Diagnosis not present

## 2015-08-03 DIAGNOSIS — I10 Essential (primary) hypertension: Secondary | ICD-10-CM | POA: Diagnosis not present

## 2015-08-03 DIAGNOSIS — Z6829 Body mass index (BMI) 29.0-29.9, adult: Secondary | ICD-10-CM | POA: Diagnosis not present

## 2015-08-03 DIAGNOSIS — M328 Other forms of systemic lupus erythematosus: Secondary | ICD-10-CM | POA: Diagnosis not present

## 2015-08-03 DIAGNOSIS — N181 Chronic kidney disease, stage 1: Secondary | ICD-10-CM | POA: Diagnosis not present

## 2015-08-04 DIAGNOSIS — R2689 Other abnormalities of gait and mobility: Secondary | ICD-10-CM | POA: Diagnosis not present

## 2015-08-04 DIAGNOSIS — N183 Chronic kidney disease, stage 3 (moderate): Secondary | ICD-10-CM | POA: Diagnosis not present

## 2015-08-04 DIAGNOSIS — M329 Systemic lupus erythematosus, unspecified: Secondary | ICD-10-CM | POA: Diagnosis not present

## 2015-08-04 DIAGNOSIS — Z8701 Personal history of pneumonia (recurrent): Secondary | ICD-10-CM | POA: Diagnosis not present

## 2015-08-04 DIAGNOSIS — E1122 Type 2 diabetes mellitus with diabetic chronic kidney disease: Secondary | ICD-10-CM | POA: Diagnosis not present

## 2015-08-04 DIAGNOSIS — Z7984 Long term (current) use of oral hypoglycemic drugs: Secondary | ICD-10-CM | POA: Diagnosis not present

## 2015-08-04 DIAGNOSIS — M6281 Muscle weakness (generalized): Secondary | ICD-10-CM | POA: Diagnosis not present

## 2015-08-04 DIAGNOSIS — G609 Hereditary and idiopathic neuropathy, unspecified: Secondary | ICD-10-CM | POA: Diagnosis not present

## 2015-08-16 DIAGNOSIS — M659 Synovitis and tenosynovitis, unspecified: Secondary | ICD-10-CM | POA: Diagnosis not present

## 2015-08-16 DIAGNOSIS — M3214 Glomerular disease in systemic lupus erythematosus: Secondary | ICD-10-CM | POA: Diagnosis not present

## 2015-08-16 DIAGNOSIS — E1140 Type 2 diabetes mellitus with diabetic neuropathy, unspecified: Secondary | ICD-10-CM | POA: Diagnosis not present

## 2015-08-16 DIAGNOSIS — Z87891 Personal history of nicotine dependence: Secondary | ICD-10-CM | POA: Diagnosis not present

## 2015-08-16 DIAGNOSIS — M059 Rheumatoid arthritis with rheumatoid factor, unspecified: Secondary | ICD-10-CM | POA: Diagnosis not present

## 2015-08-16 DIAGNOSIS — G629 Polyneuropathy, unspecified: Secondary | ICD-10-CM | POA: Diagnosis not present

## 2015-08-16 DIAGNOSIS — M069 Rheumatoid arthritis, unspecified: Secondary | ICD-10-CM | POA: Diagnosis not present

## 2015-08-26 DIAGNOSIS — R2689 Other abnormalities of gait and mobility: Secondary | ICD-10-CM | POA: Diagnosis not present

## 2015-08-26 DIAGNOSIS — Z7984 Long term (current) use of oral hypoglycemic drugs: Secondary | ICD-10-CM | POA: Diagnosis not present

## 2015-08-26 DIAGNOSIS — M6281 Muscle weakness (generalized): Secondary | ICD-10-CM | POA: Diagnosis not present

## 2015-08-26 DIAGNOSIS — E1122 Type 2 diabetes mellitus with diabetic chronic kidney disease: Secondary | ICD-10-CM | POA: Diagnosis not present

## 2015-08-26 DIAGNOSIS — Z8701 Personal history of pneumonia (recurrent): Secondary | ICD-10-CM | POA: Diagnosis not present

## 2015-08-26 DIAGNOSIS — N183 Chronic kidney disease, stage 3 (moderate): Secondary | ICD-10-CM | POA: Diagnosis not present

## 2015-08-26 DIAGNOSIS — G609 Hereditary and idiopathic neuropathy, unspecified: Secondary | ICD-10-CM | POA: Diagnosis not present

## 2015-08-26 DIAGNOSIS — M329 Systemic lupus erythematosus, unspecified: Secondary | ICD-10-CM | POA: Diagnosis not present

## 2015-09-29 DIAGNOSIS — D649 Anemia, unspecified: Secondary | ICD-10-CM | POA: Diagnosis not present

## 2015-09-29 DIAGNOSIS — M329 Systemic lupus erythematosus, unspecified: Secondary | ICD-10-CM | POA: Diagnosis not present

## 2015-11-03 DIAGNOSIS — R5383 Other fatigue: Secondary | ICD-10-CM | POA: Diagnosis not present

## 2015-11-03 DIAGNOSIS — K219 Gastro-esophageal reflux disease without esophagitis: Secondary | ICD-10-CM | POA: Diagnosis not present

## 2015-11-03 DIAGNOSIS — I10 Essential (primary) hypertension: Secondary | ICD-10-CM | POA: Diagnosis not present

## 2015-11-03 DIAGNOSIS — L930 Discoid lupus erythematosus: Secondary | ICD-10-CM | POA: Diagnosis not present

## 2015-11-03 DIAGNOSIS — J45991 Cough variant asthma: Secondary | ICD-10-CM | POA: Diagnosis not present

## 2015-11-03 DIAGNOSIS — Z6830 Body mass index (BMI) 30.0-30.9, adult: Secondary | ICD-10-CM | POA: Diagnosis not present

## 2015-11-03 DIAGNOSIS — Z789 Other specified health status: Secondary | ICD-10-CM | POA: Diagnosis not present

## 2015-11-03 DIAGNOSIS — Z299 Encounter for prophylactic measures, unspecified: Secondary | ICD-10-CM | POA: Diagnosis not present

## 2015-12-01 DIAGNOSIS — M329 Systemic lupus erythematosus, unspecified: Secondary | ICD-10-CM | POA: Diagnosis not present

## 2015-12-01 DIAGNOSIS — E1169 Type 2 diabetes mellitus with other specified complication: Secondary | ICD-10-CM | POA: Diagnosis not present

## 2015-12-01 DIAGNOSIS — M069 Rheumatoid arthritis, unspecified: Secondary | ICD-10-CM | POA: Diagnosis not present

## 2015-12-08 DIAGNOSIS — H2513 Age-related nuclear cataract, bilateral: Secondary | ICD-10-CM | POA: Diagnosis not present

## 2015-12-08 DIAGNOSIS — H40033 Anatomical narrow angle, bilateral: Secondary | ICD-10-CM | POA: Diagnosis not present

## 2015-12-21 DIAGNOSIS — Z1231 Encounter for screening mammogram for malignant neoplasm of breast: Secondary | ICD-10-CM | POA: Diagnosis not present

## 2015-12-21 DIAGNOSIS — R928 Other abnormal and inconclusive findings on diagnostic imaging of breast: Secondary | ICD-10-CM | POA: Diagnosis not present

## 2015-12-28 ENCOUNTER — Telehealth (INDEPENDENT_AMBULATORY_CARE_PROVIDER_SITE_OTHER): Payer: Self-pay | Admitting: Rheumatology

## 2015-12-28 NOTE — Telephone Encounter (Signed)
Patient states a referral has been sent several times(3 to be exact) to Dr. Estanislado Pandy from Dr. Adele Schilder with [facility].   Patient stated that she is a new patient, however I was able to locate her in [facility]. It is documented that she was seen in 2016 by Dr Estanislado Pandy.  She is asking why she has not heard anything from our office about the doctor assuming her care.

## 2015-12-29 NOTE — Telephone Encounter (Signed)
Can not accept patient, she should continue with her current rheumatologist/ per Dr Estanislado Pandy

## 2015-12-29 NOTE — Telephone Encounter (Signed)
Hey Amy. This is about a Public librarian referral. Thanks!

## 2016-01-14 DIAGNOSIS — Z713 Dietary counseling and surveillance: Secondary | ICD-10-CM | POA: Diagnosis not present

## 2016-01-14 DIAGNOSIS — Z299 Encounter for prophylactic measures, unspecified: Secondary | ICD-10-CM | POA: Diagnosis not present

## 2016-01-14 DIAGNOSIS — Z6829 Body mass index (BMI) 29.0-29.9, adult: Secondary | ICD-10-CM | POA: Diagnosis not present

## 2016-01-14 DIAGNOSIS — I10 Essential (primary) hypertension: Secondary | ICD-10-CM | POA: Diagnosis not present

## 2016-01-14 DIAGNOSIS — N182 Chronic kidney disease, stage 2 (mild): Secondary | ICD-10-CM | POA: Diagnosis not present

## 2016-02-01 ENCOUNTER — Ambulatory Visit (INDEPENDENT_AMBULATORY_CARE_PROVIDER_SITE_OTHER)
Admission: RE | Admit: 2016-02-01 | Discharge: 2016-02-01 | Disposition: A | Discharge status: Home / Self Care | Payer: PPO | Source: Ambulatory Visit | Attending: Pulmonary Disease | Admitting: Pulmonary Disease

## 2016-02-01 ENCOUNTER — Ambulatory Visit (INDEPENDENT_AMBULATORY_CARE_PROVIDER_SITE_OTHER): Payer: PPO | Admitting: Pulmonary Disease

## 2016-02-01 ENCOUNTER — Encounter: Payer: Self-pay | Admitting: Pulmonary Disease

## 2016-02-01 VITALS — BP 132/84 | HR 84 | Ht 65.0 in | Wt 177.0 lb

## 2016-02-01 DIAGNOSIS — R05 Cough: Secondary | ICD-10-CM

## 2016-02-01 DIAGNOSIS — R059 Cough, unspecified: Secondary | ICD-10-CM

## 2016-02-01 MED ORDER — BENZONATATE 100 MG PO CAPS: 100.0000 mg | ORAL_CAPSULE | Freq: Four times a day (QID) | ORAL | 1 refills | Status: DC | PRN

## 2016-02-01 NOTE — Progress Notes (Signed)
Subjective:    Patient ID: Andrea Leblanc, female    DOB: 31-Oct-1950, 65 y.o.   MRN: [id number]  HPI Chief Complaint  Patient presents with  . Advice Only    self referral c/o prod cough with lots of clear mucus X4 months.  Pt dx'ed with cough variant asthma by allergist.      Andrea Leblanc is here to see me for her cough.   > She describes it as persistant > it first occurred a number of years ago in [place] and she was diagnosed by an allergist with cough variant asthma > she was prescribed aciphex which made the cough go away right away > unfortunately aciphex was taken off of her medication formulary, but the cough stayed away despite this (2007) > the cough came back in late August, she saw her her new PCP Dr. Brigitte Pulse in 10/2015 and she had it then. > she says she will cough up a lot of phlegm after heavy coughing epidodes > coughing is associated with vomiting sometimes and a runny nose > it is unpredictable, no environmental exposures, no food exposure, no time variant throughout the day > she is taking rabepazole, started on July 23, 2015, but apparently was switched to omeprazole but she isn't sure > She has some heartburn from time to time and she will treat it with TUMS.   > She says that her nose is runny a lot, but typically this is worse after a coughing > In August she was not ill > no change in her home for 2 years, no water damage, no animals > no associated wheezing or shortness of breath  She worked in banking/finance over the years.  She notes that in the 1980's she worked for a [employer], [employer].  She was working there she had a cough then as well.  She said that her cough was treated with codeine, but never successfully.      Past Medical History:  Diagnosis Date  . Asthma   . Cough variant asthma   . Diabetes mellitus, type II ([place])   . Hammer toe   . Uterine fibroid      Family History  Problem Relation Age of Onset  . Healthy Mother        Social History   Social History  . Marital status: Divorced    Spouse name: N/A  . Number of children: 0  . Years of education: N/A   Occupational History  . Unemployed    Social History Main Topics  . Smoking status: Former Smoker    Types: Cigarettes    Quit date: 01/31/1986  . Smokeless tobacco: Never Used     Comment: pt unsure of how long or how much she smoked betofre quitting.   . Alcohol use No  . Drug use: No  . Sexual activity: Not on file   Other Topics Concern  . Not on file   Social History Narrative   Lives at home alone.   Right-handed.   No caffeine use.     No Known Allergies   Outpatient Medications Prior to Visit  Medication Sig Dispense Refill  . Ascorbic Acid (VITAMIN C) 1000 MG tablet Take 1,000 mg by mouth daily.    . hydroxychloroquine (PLAQUENIL) 200 MG tablet Take by mouth 2 (two) times daily.    . Omega 3-6-9 Fatty Acids (TRIPLE OMEGA COMPLEX PO) 500mg  krill, 500mg  fish oil, 500iu vitamin D - Take two tablets daily.    Marland Kitchen  Omega-3 Fatty Acids (OMEGA-3 FISH OIL PO) Take one capsule daily.    . benazepril-hydrochlorthiazide (LOTENSIN HCT) 20-12.5 MG tablet Take 1 tablet by mouth daily.    . folic acid (FOLVITE) 1 MG tablet Take 1 mg by mouth daily.    . metFORMIN (GLUCOPHAGE) 500 MG tablet Take by mouth 2 (two) times daily with a meal.    . methotrexate (RHEUMATREX) 2.5 MG tablet Take 15 mg by mouth once a week. Caution:Chemotherapy. Protect from light.    . Multiple Vitamin (HEALTHY HAIR/SKIN/NAILS) TABS Take two tablets daily.    . Multiple Vitamin (MULTIVITAMIN) tablet Take 1 tablet by mouth daily.    . Potassium 99 MG TABS Take 99 mg by mouth daily.    Marland Kitchen UNABLE TO FIND Viviscal hair growth program    . UNABLE TO FIND Beaute Caps (Youth Replenishment for Skin, Hair & Nails) - Take two capsules daily.     No facility-administered medications prior to visit.       Review of Systems  Constitutional: Negative for fever and unexpected  weight change.  HENT: Positive for congestion. Negative for dental problem, ear pain, nosebleeds, postnasal drip, rhinorrhea, sinus pressure, sneezing, sore throat and trouble swallowing.   Eyes: Negative for redness and itching.  Respiratory: Positive for cough and shortness of breath. Negative for chest tightness and wheezing.   Cardiovascular: Negative for palpitations and leg swelling.  Gastrointestinal: Positive for nausea. Negative for vomiting.  Genitourinary: Negative for dysuria.  Musculoskeletal: Negative for joint swelling.  Skin: Negative for rash.  Neurological: Negative for headaches.  Hematological: Does not bruise/bleed easily.  Psychiatric/Behavioral: Negative for dysphoric mood. The patient is not nervous/anxious.        Objective:   Physical Exam  Vitals:   02/01/16 1506  BP: 132/84  Pulse: 84  SpO2: 97%  Weight: 177 lb (80.3 kg)  Height: 5\' 5"  (1.651 m)   RA  Gen: well appearing, no acute distress HENT: NCAT, OP clear, neck supple without masses Eyes: PERRL, EOMi Lymph: no cervical lymphadenopathy PULM: Coarse crackles bases B, normal air movement CV: RRR, no mgr, no JVD GI: BS+, soft, nontender, no hsm Derm: thickening of skin around chest/neck, no rash or skin breakdown MSK: normal bulk and tone Neuro: A&Ox4, CN II-XII intact, strength 5/5 in all 4 extremities Psyche: normal mood and affect    Records from [facility] rheumatology clinic from June 2017 reviewed where she was treated for a lupus and rheumatoid arthritis overlap syndrome. Started Methotrexate in 10/2014 for this, stopped in June 2017 then switched to leflunomide and hydroxychloroquine and low-dose prednisone. Apparently she had evidence of glomerular disease related to her lupus.    Assessment & Plan:  Impression: Cough Abnormal chest sounds Lupus Rheumatoid arthritis History of methotrexate use  Discussion: At first glance it appears that Dub Mikes has cough related to  untreated gastroesophageal reflux disease as she is confused about her antacid regimen despite having 2 separate proton pump inhibitors on her medication list. Further, she continues to have symptoms of heartburn. She also describes a tickle in her throat which leads me to believe that this is likely at least in part due to laryngeal irritation. However, I was surprised to hear coarse crackles in the bases of her lungs which is suggestive of an underlying lung disease such as pulmonary fibrosis. This may be related to her underlying connective tissue disease or less likely related to the methotrexate she took in 2016.  For your cough: Try Tessalon 100  mg up to 3 times a day as needed for cough I will check a chest x-ray I will check a lung function test I will check exhaled nitric oxide today You may need to have a CAT scan of your chest  For the abnormal breath sounds: Chest x-ray Possible CT scan of chest  I will see you back in 2-3 weeks or sooner if needed    Current Outpatient Prescriptions:  .  Ascorbic Acid (VITAMIN C) 1000 MG tablet, Take 1,000 mg by mouth daily., Disp: , Rfl:  .  chlorthalidone (HYGROTON) 25 MG tablet, Take 25 mg by mouth daily., Disp: , Rfl:  .  gabapentin (NEURONTIN) 300 MG capsule, Take 300 mg by mouth daily., Disp: , Rfl:  .  gabapentin (NEURONTIN) 600 MG tablet, Take 600 mg by mouth 2 (two) times daily., Disp: , Rfl:  .  hydroxychloroquine (PLAQUENIL) 200 MG tablet, Take by mouth 2 (two) times daily., Disp: , Rfl:  .  Omega 3-6-9 Fatty Acids (TRIPLE OMEGA COMPLEX PO), 500mg  krill, 500mg  fish oil, 500iu vitamin D - Take two tablets daily., Disp: , Rfl:  .  Omega-3 Fatty Acids (OMEGA-3 FISH OIL PO), Take one capsule daily., Disp: , Rfl:  .  omeprazole (PRILOSEC) 20 MG capsule, Take 20 mg by mouth daily., Disp: , Rfl:  .  predniSONE (DELTASONE) 5 MG tablet, Take 5 mg by mouth daily with breakfast., Disp: , Rfl:  .  RABEprazole (ACIPHEX) 20 MG tablet, Take 20 mg  by mouth daily., Disp: , Rfl:  .  vitamin B-12 (CYANOCOBALAMIN) 1000 MCG tablet, Take 1,000 mcg by mouth daily., Disp: , Rfl:

## 2016-02-01 NOTE — Patient Instructions (Signed)
For your cough: Try Tessalon 100 mg up to 3 times a day as needed for cough I will check a chest x-ray I will check a lung function test I will check exhaled nitric oxide today You may need to have a CAT scan of your chest  For the abnormal breath sounds: Chest x-ray Possible CT scan of chest  I will see you back in 2-3 weeks or sooner if needed

## 2016-02-02 DIAGNOSIS — R928 Other abnormal and inconclusive findings on diagnostic imaging of breast: Secondary | ICD-10-CM | POA: Diagnosis not present

## 2016-02-02 DIAGNOSIS — N6489 Other specified disorders of breast: Secondary | ICD-10-CM | POA: Diagnosis not present

## 2016-02-04 ENCOUNTER — Telehealth: Payer: Self-pay | Admitting: Pulmonary Disease

## 2016-02-04 DIAGNOSIS — R9389 Abnormal findings on diagnostic imaging of other specified body structures: Secondary | ICD-10-CM

## 2016-02-04 NOTE — Telephone Encounter (Signed)
lmtcb x1 for pt. 

## 2016-02-04 NOTE — Telephone Encounter (Signed)
A, The radiologist feels she needs to have a CT chest because she may have scarring in her lungs. I agree. Please arrange HRCT for abnormal CXR. Thanks B ---------- Spoke with pt, aware of recs. Ct ordered.  Nothing further needed.

## 2016-02-07 NOTE — Telephone Encounter (Signed)
Pt spoke with insurance company, states that they quoted her that her out of pocket cost for the PFT in 2017 would be $170 and $190 in 2018. She was also informed that her CT would cost $200 out of pocket. Patient states that she is not going to have these tests done d/t the cost. Any further rec's Dr [place] Bells? Thanks.

## 2016-02-07 NOTE — Telephone Encounter (Signed)
Spoke with pt and informed her that we do not have the answer to her question as far as how much it will cost her that she has to contact her insurance company for this. Pt. Stated that she would and would contact us if she wants to have this test done. Nothing further is needed at this time.

## 2016-02-07 NOTE — Telephone Encounter (Signed)
Patient is returning phone call.  °

## 2016-02-07 NOTE — Telephone Encounter (Signed)
Pt called her insurance company [phone]

## 2016-02-08 ENCOUNTER — Telehealth: Payer: Self-pay | Admitting: Pulmonary Disease

## 2016-02-08 MED ORDER — BENZONATATE 200 MG PO CAPS: 200.0000 mg | ORAL_CAPSULE | Freq: Three times a day (TID) | ORAL | 1 refills | Status: DC | PRN

## 2016-02-08 NOTE — Telephone Encounter (Signed)
Spoke with patient, aware of rec's per BQ Pt states that she needs our [facility] to contact her insurance to do a precert so that she ill know exactly how much out of pocket she will have to pay for the CT scan.  Please advise Libby. Thanks.

## 2016-02-08 NOTE — Telephone Encounter (Signed)
Try taking tessalon 200mg  q8h prn cough  I'm OK with her doing CT and PFT at outpatient facilities, just may take longer

## 2016-02-08 NOTE — Telephone Encounter (Signed)
I feel she needs the HRCT.  If an appeal to her insurance company is helpful we can do that.

## 2016-02-08 NOTE — Telephone Encounter (Signed)
Called and spoke to pt. Pt states she was taking the Tessalon Perles for her cough but they have not helped. Pt is requesting recs from BQ if he thinks she needs another medication for her cough.   Pt also states she will call her insurance back to see if the PFT and CT will be cheaper if they are done at an outpatient facility.   Dr. [place] Bells please advise if you would like pt to try another medication for her cough.

## 2016-02-08 NOTE — Telephone Encounter (Signed)
There is another phone note open on her CT scan -- see 02/04/16 telephone note.  Tessalon Perles 200mg  sent to pharmacy- pt advised okay to use up her 100mg  capsules first. Nothing further needed.

## 2016-02-09 ENCOUNTER — Encounter (HOSPITAL_COMMUNITY): Payer: PPO

## 2016-02-09 ENCOUNTER — Ambulatory Visit (HOSPITAL_COMMUNITY): Payer: PPO

## 2016-02-11 ENCOUNTER — Telehealth: Payer: Self-pay | Admitting: Pulmonary Disease

## 2016-02-11 DIAGNOSIS — R059 Cough, unspecified: Secondary | ICD-10-CM

## 2016-02-11 DIAGNOSIS — G5790 Unspecified mononeuropathy of unspecified lower limb: Secondary | ICD-10-CM | POA: Diagnosis not present

## 2016-02-11 DIAGNOSIS — R05 Cough: Secondary | ICD-10-CM

## 2016-02-11 NOTE — Telephone Encounter (Signed)
lmomtcb x1 

## 2016-02-15 NOTE — Telephone Encounter (Signed)
Pt called back and stated that someone was to call her insurance company to give codes as to why she needs the CT scan done.  I see in the previous phone note that she was given the costs of these tests, [place], can you please advise if this would be the same cost for her in a facility compared to having in the hospital.  Please advise or can call and speak with pt about these costs.  thanks

## 2016-02-15 NOTE — Telephone Encounter (Signed)
lmtcb for pt.  

## 2016-02-17 NOTE — Telephone Encounter (Signed)
Will forward back to [facility] to see if any information has been found out.  thanks

## 2016-02-17 NOTE — Telephone Encounter (Signed)
Patient is returning phone call.  °

## 2016-02-18 DIAGNOSIS — N182 Chronic kidney disease, stage 2 (mild): Secondary | ICD-10-CM | POA: Diagnosis not present

## 2016-02-18 DIAGNOSIS — R05 Cough: Secondary | ICD-10-CM | POA: Diagnosis not present

## 2016-02-18 DIAGNOSIS — Z299 Encounter for prophylactic measures, unspecified: Secondary | ICD-10-CM | POA: Diagnosis not present

## 2016-02-18 DIAGNOSIS — I10 Essential (primary) hypertension: Secondary | ICD-10-CM | POA: Diagnosis not present

## 2016-02-18 DIAGNOSIS — M79601 Pain in right arm: Secondary | ICD-10-CM | POA: Diagnosis not present

## 2016-02-22 DIAGNOSIS — M069 Rheumatoid arthritis, unspecified: Secondary | ICD-10-CM | POA: Diagnosis not present

## 2016-02-22 DIAGNOSIS — M19071 Primary osteoarthritis, right ankle and foot: Secondary | ICD-10-CM | POA: Diagnosis not present

## 2016-02-22 DIAGNOSIS — D8989 Other specified disorders involving the immune mechanism, not elsewhere classified: Secondary | ICD-10-CM | POA: Diagnosis not present

## 2016-02-22 DIAGNOSIS — M19072 Primary osteoarthritis, left ankle and foot: Secondary | ICD-10-CM | POA: Diagnosis not present

## 2016-02-22 DIAGNOSIS — M79671 Pain in right foot: Secondary | ICD-10-CM | POA: Diagnosis not present

## 2016-02-22 DIAGNOSIS — M329 Systemic lupus erythematosus, unspecified: Secondary | ICD-10-CM | POA: Diagnosis not present

## 2016-02-22 DIAGNOSIS — M797 Fibromyalgia: Secondary | ICD-10-CM | POA: Diagnosis not present

## 2016-02-22 DIAGNOSIS — Z79899 Other long term (current) drug therapy: Secondary | ICD-10-CM | POA: Diagnosis not present

## 2016-02-24 ENCOUNTER — Ambulatory Visit: Payer: Medicare HMO | Admitting: "Endocrinology

## 2016-02-24 NOTE — Telephone Encounter (Signed)
[facility] can you give an update on this? Thanks.

## 2016-03-01 NOTE — Telephone Encounter (Signed)
Andrea Leblanc please advise if anything further is needed on this message. Thanks.

## 2016-03-01 NOTE — Telephone Encounter (Signed)
New orders placed

## 2016-03-01 NOTE — Telephone Encounter (Signed)
Patient is requesting to speak with [facility]. Patient stated she haven't received a call back and she is following up.

## 2016-03-01 NOTE — Telephone Encounter (Signed)
Ok all has been handled for her ins I now need both chest ct and pft order put into computer to be scheduled at [facility] then I will call her back Andrea Leblanc

## 2016-03-01 NOTE — Telephone Encounter (Signed)
I have spoken to this pt she is aware of her part of the cost of both of these test $200 for ct and I was told the pft was covered@100 % she wants me to ck this again I will do so Joellen Jersey

## 2016-03-01 NOTE — Telephone Encounter (Signed)
Forwarding message to [place].

## 2016-03-01 NOTE — Telephone Encounter (Signed)
Spoke to pt she is aware I am waiting to hear back from [facility] on the cost of her pft Andrea Leblanc

## 2016-03-07 DIAGNOSIS — M329 Systemic lupus erythematosus, unspecified: Secondary | ICD-10-CM | POA: Diagnosis not present

## 2016-03-07 DIAGNOSIS — Z79899 Other long term (current) drug therapy: Secondary | ICD-10-CM | POA: Diagnosis not present

## 2016-03-07 DIAGNOSIS — M069 Rheumatoid arthritis, unspecified: Secondary | ICD-10-CM | POA: Diagnosis not present

## 2016-03-07 DIAGNOSIS — M79671 Pain in right foot: Secondary | ICD-10-CM | POA: Diagnosis not present

## 2016-03-10 ENCOUNTER — Ambulatory Visit (HOSPITAL_COMMUNITY)
Admission: RE | Admit: 2016-03-10 | Discharge: 2016-03-10 | Disposition: A | Discharge status: Home / Self Care | Payer: PPO | Source: Ambulatory Visit | Attending: Pulmonary Disease | Admitting: Pulmonary Disease

## 2016-03-10 DIAGNOSIS — R942 Abnormal results of pulmonary function studies: Secondary | ICD-10-CM | POA: Diagnosis not present

## 2016-03-10 DIAGNOSIS — I313 Pericardial effusion (noninflammatory): Secondary | ICD-10-CM | POA: Insufficient documentation

## 2016-03-10 DIAGNOSIS — J849 Interstitial pulmonary disease, unspecified: Secondary | ICD-10-CM | POA: Insufficient documentation

## 2016-03-10 DIAGNOSIS — I700 Atherosclerosis of aorta: Secondary | ICD-10-CM | POA: Diagnosis not present

## 2016-03-10 DIAGNOSIS — Z87891 Personal history of nicotine dependence: Secondary | ICD-10-CM | POA: Diagnosis not present

## 2016-03-10 DIAGNOSIS — R059 Cough, unspecified: Secondary | ICD-10-CM

## 2016-03-10 DIAGNOSIS — I2510 Atherosclerotic heart disease of native coronary artery without angina pectoris: Secondary | ICD-10-CM | POA: Insufficient documentation

## 2016-03-10 DIAGNOSIS — R05 Cough: Secondary | ICD-10-CM | POA: Insufficient documentation

## 2016-03-10 LAB — PULMONARY FUNCTION TEST
DL/VA % pred: 59 %
DL/VA: 2.94 ml/min/mmHg/L
DLCO UNC % PRED: 35 %
DLCO UNC: 9.18 ml/min/mmHg
FEF 25-75 PRE: 1.92 L/s
FEF 25-75 Post: 1.88 L/sec
FEF2575-%CHANGE-POST: -2 %
FEF2575-%Pred-Post: 84 %
FEF2575-%Pred-Pre: 86 %
FEV1-%Change-Post: 1 %
FEV1-%PRED-POST: 71 %
FEV1-%PRED-PRE: 70 %
FEV1-POST: 1.81 L
FEV1-Pre: 1.78 L
FEV1FVC-%Change-Post: 1 %
FEV1FVC-%Pred-Pre: 108 %
FEV6-%CHANGE-POST: 0 %
FEV6-%PRED-POST: 66 %
FEV6-%PRED-PRE: 66 %
FEV6-POST: 2.13 L
FEV6-PRE: 2.13 L
FEV6FVC-%CHANGE-POST: 0 %
FEV6FVC-%PRED-PRE: 103 %
FEV6FVC-%Pred-Post: 104 %
FVC-%CHANGE-POST: 0 %
FVC-%Pred-Post: 64 %
FVC-%Pred-Pre: 64 %
FVC-Post: 2.13 L
FVC-Pre: 2.14 L
POST FEV6/FVC RATIO: 100 %
Post FEV1/FVC ratio: 85 %
Pre FEV1/FVC ratio: 83 %
Pre FEV6/FVC Ratio: 99 %
RV % PRED: 71 %
RV: 1.52 L
TLC % pred: 70 %
TLC: 3.65 L

## 2016-03-10 MED ORDER — ALBUTEROL SULFATE (2.5 MG/3ML) 0.083% IN NEBU
2.5000 mg | INHALATION_SOLUTION | Freq: Once | RESPIRATORY_TRACT | Status: AC
  Administered 2016-03-10: 2.5 mg via RESPIRATORY_TRACT

## 2016-03-16 ENCOUNTER — Other Ambulatory Visit: Payer: Self-pay | Admitting: *Deleted

## 2016-03-16 NOTE — Patient Outreach (Signed)
Andrea Leblanc [facility]) Care Management  03/16/2016  Andrea Leblanc 01/18/1951 [id number]   Referral received from [facility] to assist patient with concern regarding medication cost and navigating the health system.  Patient very angry and frustrated during the phone call. States that she has paid over$ 900.00 in medication cost and is requesting an audit of her prescription cost to see what was actually paid and what could have been adjusted. "I want a complete inventory of what I paid out last year"  Per patient, she would like for someone to explain to her the meaning of the "donut whole" and "deductibles".  Patient refused referral to pharmacy for med reconciliation, however is insisting on an audit to be done.  Patient grew increasingly frustrated, when this social worker tried to explain the [facility] care management program, engage her in services and the role of this Education officer, museum.  "Like I said, I want a complete audit of my prescriptions form the year 2017".   Plan: This Education officer, museum will discuss patient's concerns with the Director of Pharmacy to explore patient options and alternatives.    Sheralyn Boatman [facility] Care Management [phone]

## 2016-03-17 ENCOUNTER — Other Ambulatory Visit: Payer: Self-pay | Admitting: *Deleted

## 2016-03-17 ENCOUNTER — Other Ambulatory Visit: Payer: Self-pay | Admitting: Pharmacist

## 2016-03-17 NOTE — Patient Outreach (Signed)
[facility]) Care Management  03/17/2016  Andrea Leblanc August 13, 1950 [id number]   Return phone call from patient. This Education officer, museum discussed collaboration phone call to Marine scientist who would be able to assist with an audit of her prescription cost. Patient stated that she had already received a call from the pharmacist and was appreciative.     Sheralyn Boatman [facility] Care Management [phone]

## 2016-03-17 NOTE — Patient Outreach (Signed)
[facility]) Care Management  03/17/2016  Andrea Leblanc [birth year] [id number]   Follow up phone call to patient to inform her of referral to pharmacy to assist with obtaining an audit of her prescription cost for the year 2017.  HIPPA compliant voicemail message left requesting a return call.    Sheralyn Boatman [facility] Care Management [phone]

## 2016-03-17 NOTE — Patient Outreach (Signed)
[facility] [facility]) Care Management  03/17/2016  Andrea Leblanc 09-19-1950 [id number]   65 yoF with PMHx significant for lupus, RA, asthma, T2DM, and chronic pain referred to [place] [facility], [facility], for medication review, assistance with obtaining audit of prescription costs for 2017, and estimated costs for medications for 2018.    Placed outreach phone call to [facility] pharmacy specialist for printout of 2017 out-of-pocket medication costs and left message.    Successful patient outreach phone-call placed to patient.  Two HIPAA identifiers verified.  Patient reviewed current medications and allergies.  Updated patient with attempt to receive audit of medication costs for 2017.  Patient thankful for assistance.  Patient agreeable to phone-call next week after audit received to review 2017 costs and estimated 2018 costs.    Plan: Call patient early next week to review plan information when received from [facility] specialist.    Ralene Bathe, PharmD, [place] [phone]

## 2016-03-19 ENCOUNTER — Encounter: Payer: Self-pay | Admitting: Pulmonary Disease

## 2016-03-19 DIAGNOSIS — J84112 Idiopathic pulmonary fibrosis: Secondary | ICD-10-CM | POA: Insufficient documentation

## 2016-03-20 ENCOUNTER — Ambulatory Visit: Payer: Self-pay | Admitting: Pharmacist

## 2016-03-20 ENCOUNTER — Other Ambulatory Visit: Payer: Self-pay | Admitting: Pharmacist

## 2016-03-20 NOTE — Patient Outreach (Signed)
[facility]) Care Management  03/20/2016  ARYAHI DENZLER 06-Aug-1950 [id number]   Unsuccessful patient outreach phone-call this afternoon to discuss 2017 out-of-pocket expenditure and review medication costs for 2018.  HIPAA compliant voicemail left. Will re-try patient later this week.    Ralene Bathe, PharmD, [place] [phone]

## 2016-03-21 ENCOUNTER — Other Ambulatory Visit: Payer: Self-pay | Admitting: Pharmacist

## 2016-03-21 NOTE — Patient Outreach (Signed)
[facility]) Care Management  03/21/2016  THOMASA HEIDLER 05-07-1950 [id number]   Successful outpatient phone call to patient by [facility] pharmacist.  HIPAA identifiers verified.  Offered to set up home visit with patient to answer her medication questions and review her report of 2017 out-of-pocket expenditure however patient declined home visit.  She stated she preferred to speak over the phone.    Reviewed patient's 2017 report of medication costs recieved from [facility] ([facility]A) pharmacy specialist. Patient stated she also had a report from [facility] for her out-of-pocket medication costs for 2017 which was slightly higher therefore she did not want a copy of the [facility] report.     Attempted to review 2018 [facility] benefits, tier copays, and differences between insurance phases however per patient, this was a "waste of time."  She had several questions regarding specific co-pay amounts last year and why certain medications were not covered through her insurance.  Reached out to [facility] in [place] for patient and to [facility] on behalf of patient to assist with her questions.  Discussed benefits of 90 day supply with in-network pharmacy ([facility] in [place] included) for copay savings.  Also discussed in general the eligibility requirements for patient assistance programs regarding annual income and medication expenditures.   Patient very frustrated in general with high health care costs and not knowing what her medical and prescription co-pays are ahead of time.  Offered support and assistance in the future.    Patient had no further questions at this time therefore will close case.  Pharmacy is happy to assist in the future as warranted.   Ralene Bathe, PharmD, [place] [phone]

## 2016-03-22 ENCOUNTER — Ambulatory Visit: Payer: Self-pay | Admitting: Pharmacist

## 2016-03-23 ENCOUNTER — Encounter: Payer: Self-pay | Admitting: Pulmonary Disease

## 2016-03-23 ENCOUNTER — Ambulatory Visit (INDEPENDENT_AMBULATORY_CARE_PROVIDER_SITE_OTHER): Payer: PPO | Admitting: Pulmonary Disease

## 2016-03-23 VITALS — BP 126/72 | HR 108 | Ht 65.0 in | Wt 172.0 lb

## 2016-03-23 DIAGNOSIS — M329 Systemic lupus erythematosus, unspecified: Secondary | ICD-10-CM | POA: Insufficient documentation

## 2016-03-23 DIAGNOSIS — M3219 Other organ or system involvement in systemic lupus erythematosus: Secondary | ICD-10-CM

## 2016-03-23 DIAGNOSIS — J8410 Pulmonary fibrosis, unspecified: Secondary | ICD-10-CM

## 2016-03-23 DIAGNOSIS — J84112 Idiopathic pulmonary fibrosis: Secondary | ICD-10-CM

## 2016-03-23 NOTE — Patient Instructions (Signed)
We will arrange for a barium swallow test We will arrange for a pulmonary function test in 6 months to evaluate your pulmonary fibrosis I will see you back in 2 months or sooner if needed

## 2016-03-23 NOTE — Assessment & Plan Note (Addendum)
Andrea Leblanc is doing well right now but she continues to have an abnormal chest exam with coarse crackles. I have personally reviewed the images from her CT chest and gone over them with her today in clinic which show significant traction bronchiectasis and honeycombing most prominent in the bases with a very large and dilated esophagus filled with fluid. There she has no symptoms of her GERD rotation she does have severe acid reflux. There is no question about that based on the way her CT scan shows.   So I'm confident she has usual interstitial pneumonitis based on the pattern on her CT chest, however its unclear if this is due to her underlying connective tissue disease or more likely recurrent aspiration.   Plan: I think the best approach moving forward is to arrange for a barium swallow and then possibly a Nissen. Whether or not we and up pushing for more aggressive immunosuppression will depend on whether or not her respiratory status, pulmonary function testing, or oxygenation decline. Repeat PFT in 6 months

## 2016-03-23 NOTE — Assessment & Plan Note (Signed)
Continue prednisone and immunosuppression as directed by her rheumatologist. Will cc them on this note

## 2016-03-23 NOTE — Progress Notes (Signed)
Subjective:    Patient ID: Andrea Leblanc, female    DOB: Feb 13, 1951, 66 y.o.   MRN: [id number]  Synopsis: First seen in December 2017 for pulmonary fibrosis in the setting of rheumatoid arthritis and systemic lupus erythematous.    Records from [facility] rheumatology clinic from June 2017 reviewed where she was treated for a lupus and rheumatoid arthritis overlap syndrome. Started Methotrexate in 10/2014 for this, stopped in June 2017 then switched to leflunomide and hydroxychloroquine and low-dose prednisone. Apparently she had evidence of glomerular disease related to her lupus.  HPI Chief Complaint  Patient presents with  . Follow-up    review CT chest.  cough is improved per pt.    Lina says the cough has subsided.  She had a severe cough for a few weeks and sounded "bronchial" but it is better now.  This is in keeping with her   She doesn't have any dyspnea.  She says her energy level is low right now.  She dozes off from time to time and takes naps occasionally.  She has been told that she snores. She may have a headache from time to time in the mornings.  She has seen Dr. Otho Ket here in [place].    She has severe heartburn and this has been worse lately.  She denies dysphagia.    Past Medical History:  Diagnosis Date  . Asthma   . Cough variant asthma   . Diabetes mellitus, type II ([place])   . Hammer toe   . Uterine fibroid      Family History  Problem Relation Age of Onset  . Healthy Mother      Social History   Social History  . Marital status: Single    Spouse name: N/A  . Number of children: 0  . Years of education: N/A   Occupational History  . Unemployed    Social History Main Topics  . Smoking status: Former Smoker    Types: Cigarettes    Quit date: 01/31/1986  . Smokeless tobacco: Never Used     Comment: pt unsure of how long or how much she smoked betofre quitting.   . Alcohol use No  . Drug use: No  . Sexual activity: Not on  file   Other Topics Concern  . Not on file   Social History Narrative   Lives at home alone.   Right-handed.   No caffeine use.     No Known Allergies   Outpatient Medications Prior to Visit  Medication Sig Dispense Refill  . chlorthalidone (HYGROTON) 25 MG tablet Take 25 mg by mouth daily.    Marland Kitchen gabapentin (NEURONTIN) 300 MG capsule Take 300 mg by mouth daily.     Marland Kitchen gabapentin (NEURONTIN) 600 MG tablet Take 600 mg by mouth 2 (two) times daily.     . hydroxychloroquine (PLAQUENIL) 200 MG tablet Take by mouth 2 (two) times daily.    . Omega 3-6-9 Fatty Acids (TRIPLE OMEGA COMPLEX PO) 500mg  krill, 500mg  fish oil, 500iu vitamin D - Take two tablets daily.    . Omega-3 Fatty Acids (OMEGA-3 FISH OIL PO) Take one capsule daily.    . predniSONE (DELTASONE) 5 MG tablet Take 10 mg by mouth daily with breakfast.     . RABEprazole (ACIPHEX) 20 MG tablet Take 20 mg by mouth daily.    . vitamin B-12 (CYANOCOBALAMIN) 1000 MCG tablet Take 1,000 mcg by mouth daily.    . Ascorbic Acid (VITAMIN C) 1000  MG tablet Take 1,000 mg by mouth daily.     No facility-administered medications prior to visit.       Review of Systems  Constitutional: Negative for fever and unexpected weight change.  HENT: Positive for congestion. Negative for dental problem, ear pain, nosebleeds, postnasal drip, rhinorrhea, sinus pressure, sneezing, sore throat and trouble swallowing.   Eyes: Negative for redness and itching.  Respiratory: Positive for cough and shortness of breath. Negative for chest tightness and wheezing.   Cardiovascular: Negative for palpitations and leg swelling.  Gastrointestinal: Positive for nausea. Negative for vomiting.  Genitourinary: Negative for dysuria.  Musculoskeletal: Negative for joint swelling.  Skin: Negative for rash.  Neurological: Negative for headaches.  Hematological: Does not bruise/bleed easily.  Psychiatric/Behavioral: Negative for dysphoric mood. The patient is not  nervous/anxious.        Objective:   Physical Exam  Vitals:   03/23/16 1426  BP: 126/72  Pulse: (!) 108  SpO2: 97%  Weight: 172 lb (78 kg)  Height: 5\' 5"  (1.651 m)   RA  Gen: well appearing HENT: OP clear, TM's clear, neck supple PULM: Crackles bases bilaterally B, normal percussion CV: RRR, no mgr, trace edema GI: BS+, soft, nontender Derm: no cyanosis or rash Psyche: normal mood and affect   Imaging: January 2018 high-resolution CT scan of the chest showed honeycombing and traction bronchiectasis in the periphery and bases of both lungs, note was made of a patulous fluid-filled esophagus.  Pulmonary function test January 2018 ratio normal, FVC 2.13 L 64% predicted, total lung capacity 3.65 L 70% predicted, DLCO 9.18 35% predicted    Assessment & Plan:   UIP (usual interstitial pneumonitis) (HCC) Dub Mikes is doing well right now but she continues to have an abnormal chest exam with coarse crackles. I have personally reviewed the images from her CT chest and gone over them with her today in clinic which show significant traction bronchiectasis and honeycombing most prominent in the bases with a very large and dilated esophagus filled with fluid. There she has no symptoms of her GERD rotation she does have severe acid reflux. There is no question about that based on the way her CT scan shows.   So I'm confident she has usual interstitial pneumonitis based on the pattern on her CT chest, however its unclear if this is due to her underlying connective tissue disease or more likely recurrent aspiration.   Plan: I think the best approach moving forward is to arrange for a barium swallow and then possibly a Nissen. Whether or not we and up pushing for more aggressive immunosuppression will depend on whether or not her respiratory status, pulmonary function testing, or oxygenation decline. Repeat PFT in 6 months  SLE (systemic lupus erythematosus) (HCC) Continue prednisone and  immunosuppression as directed by her rheumatologist. Will cc them on this note    Current Outpatient Prescriptions:  .  chlorthalidone (HYGROTON) 25 MG tablet, Take 25 mg by mouth daily., Disp: , Rfl:  .  gabapentin (NEURONTIN) 300 MG capsule, Take 300 mg by mouth daily. , Disp: , Rfl:  .  gabapentin (NEURONTIN) 600 MG tablet, Take 600 mg by mouth 2 (two) times daily. , Disp: , Rfl:  .  hydroxychloroquine (PLAQUENIL) 200 MG tablet, Take by mouth 2 (two) times daily., Disp: , Rfl:  .  Omega 3-6-9 Fatty Acids (TRIPLE OMEGA COMPLEX PO), 500mg  krill, 500mg  fish oil, 500iu vitamin D - Take two tablets daily., Disp: , Rfl:  .  Omega-3 Fatty Acids (  OMEGA-3 FISH OIL PO), Take one capsule daily., Disp: , Rfl:  .  predniSONE (DELTASONE) 5 MG tablet, Take 10 mg by mouth daily with breakfast. , Disp: , Rfl:  .  RABEprazole (ACIPHEX) 20 MG tablet, Take 20 mg by mouth daily., Disp: , Rfl:  .  vitamin B-12 (CYANOCOBALAMIN) 1000 MCG tablet, Take 1,000 mcg by mouth daily., Disp: , Rfl:

## 2016-04-06 ENCOUNTER — Ambulatory Visit (HOSPITAL_COMMUNITY): Payer: PPO

## 2016-04-28 ENCOUNTER — Telehealth: Payer: Self-pay | Admitting: *Deleted

## 2016-04-28 NOTE — Telephone Encounter (Addendum)
Pt states she has an appt 05/01/2016 with Dr. Jacqualyn Posey, doesn't know the time or location. 06/21/2016-Pt states she was fitted for orthotics a month ago and was calling for the status.

## 2016-05-01 ENCOUNTER — Ambulatory Visit: Payer: PPO | Admitting: Podiatry

## 2016-05-25 ENCOUNTER — Encounter: Payer: Self-pay | Admitting: Podiatry

## 2016-05-25 ENCOUNTER — Ambulatory Visit (INDEPENDENT_AMBULATORY_CARE_PROVIDER_SITE_OTHER): Payer: PPO

## 2016-05-25 ENCOUNTER — Ambulatory Visit (INDEPENDENT_AMBULATORY_CARE_PROVIDER_SITE_OTHER): Payer: PPO | Admitting: Podiatry

## 2016-05-25 DIAGNOSIS — B351 Tinea unguium: Secondary | ICD-10-CM | POA: Diagnosis not present

## 2016-05-25 DIAGNOSIS — G629 Polyneuropathy, unspecified: Secondary | ICD-10-CM | POA: Diagnosis not present

## 2016-05-25 DIAGNOSIS — Q667 Congenital pes cavus, unspecified foot: Secondary | ICD-10-CM

## 2016-05-25 DIAGNOSIS — M216X9 Other acquired deformities of unspecified foot: Secondary | ICD-10-CM

## 2016-05-25 DIAGNOSIS — M79671 Pain in right foot: Secondary | ICD-10-CM | POA: Diagnosis not present

## 2016-05-25 DIAGNOSIS — M79672 Pain in left foot: Secondary | ICD-10-CM | POA: Diagnosis not present

## 2016-05-25 DIAGNOSIS — L84 Corns and callosities: Secondary | ICD-10-CM

## 2016-05-25 NOTE — Progress Notes (Addendum)
   Subjective:    Patient ID: Andrea Leblanc, female    DOB: 06/19/1950, 66 y.o.   MRN: [id number]  HPI  Chief Complaint  Patient presents with  . Toe Pain/numbness    BL; All toes; Pain radiates to arch of foot x "been going on forever".  Pt stated "that toes on the right foot twitch"   66 year old female presents the office today for multiple concerns. She states that she has fungus on her fifth digit toenails and she has tried over-the-counter treatment without any improvement. She states the nail to get painful ugly thick and her shoes. She denies any redness or drainage or any swelling. She states the majority pain to her foot is coming from the callus on the right foot within the left side and she points to submetatarsal 5. This has been ongoing for quite some time she's had no recent treatment. She also has developed a hammertoe on the left second toe over the last several years which is painful with shoes. She previously had a corn on top of the toe which was previously debrided. She also states that she has nerve damage to her feet and all of her toes become numb. She is currently on gabapentin. She is interested in orthotics today. She has no other complaints today.  Review of Systems  All other systems reviewed and are negative.      Objective:   Physical Exam General: AAO x3, NAD  Dermatological: Bilateral fifth digit toenails are hypertrophic, dystrophic, discolored. There is no surrounding edema, erythema, drainage or pus any clinical signs of infection. Bilateral simple metatarsal 5 hyperkeratotic lesions. No underwent ulceration, drainage or any signs of infection.  Vascular: Dorsalis Pedis artery and Posterior Tibial artery pedal pulses are 2/4 bilateral with immedate capillary fill time. There is no pain with calf compression, swelling, warmth, erythema.   Neruologic: Sensation decreased with Derrel Nip monofilament.  Musculoskeletal: Cavus foot type is present  bilaterally. During gait evaluation she is walking on the lateral aspect of the foot which is likely contributing to her hyperkeratotic lesions in those areas. There is no area pinpoint tenderness or pain vibratory sensation. Hammertoes present on the left second digit which is semi-reducible. Prominent metatarsal heads plantarly. MMT 5/5.  Gait: Unassisted, Nonantalgic.     Assessment & Plan:  66 year old female with bilateral simple metatarsal 5 hyperkeratotic lesions due to biomechanical changes, hammertoe left second toe, neuropathy -Treatment options discussed including all alternatives, risks, and complications -Etiology of symptoms were discussed -X-rays were obtained and reviewed with the patient. Arthritic changes are present. No evidence of acute fracture. -This time we had a discussion in regards to various treatment options. I debrided the hyperkeratotic lesions bilateral symmetric 5 and offloading pads were dispensed. I discussed with her custom inserts to help take pressure off the calluses and given her cavus foot type. She wishes to proceed with a day. She was measured for the inserts today. -Continue gabapentin for neuropathy -I would a compound cream today for onychomycosis. This was ordered through [employer].  -RTC in 3 weeks to [place], DPM

## 2016-05-29 DIAGNOSIS — M069 Rheumatoid arthritis, unspecified: Secondary | ICD-10-CM | POA: Diagnosis not present

## 2016-05-29 DIAGNOSIS — M79671 Pain in right foot: Secondary | ICD-10-CM | POA: Diagnosis not present

## 2016-05-29 DIAGNOSIS — Z79899 Other long term (current) drug therapy: Secondary | ICD-10-CM | POA: Diagnosis not present

## 2016-05-29 DIAGNOSIS — M329 Systemic lupus erythematosus, unspecified: Secondary | ICD-10-CM | POA: Diagnosis not present

## 2016-06-09 DIAGNOSIS — M069 Rheumatoid arthritis, unspecified: Secondary | ICD-10-CM | POA: Diagnosis not present

## 2016-06-22 ENCOUNTER — Telehealth: Payer: Self-pay | Admitting: Orthopedic Surgery

## 2016-06-22 NOTE — Telephone Encounter (Signed)
Patient called to ask about scheduling appointment with Dr Aline Brochure for problem of bilateral foot pain.  She relays she has been recently seen for this issue at [facility]; also mentioned that orthotics are on order there.  I relayed to patient that, due to 2nd opinion protocol, copies of notes, reports, and films will be needed for Dr to review, prior to scheduling.  Patient states she will request them from this provider and call back.

## 2016-07-31 DIAGNOSIS — M329 Systemic lupus erythematosus, unspecified: Secondary | ICD-10-CM | POA: Diagnosis not present

## 2016-07-31 DIAGNOSIS — M069 Rheumatoid arthritis, unspecified: Secondary | ICD-10-CM | POA: Diagnosis not present

## 2016-07-31 DIAGNOSIS — M79671 Pain in right foot: Secondary | ICD-10-CM | POA: Diagnosis not present

## 2016-07-31 DIAGNOSIS — Z79899 Other long term (current) drug therapy: Secondary | ICD-10-CM | POA: Diagnosis not present

## 2016-08-28 DIAGNOSIS — M069 Rheumatoid arthritis, unspecified: Secondary | ICD-10-CM | POA: Diagnosis not present

## 2016-09-18 DIAGNOSIS — M79672 Pain in left foot: Secondary | ICD-10-CM | POA: Diagnosis not present

## 2016-09-18 DIAGNOSIS — M79671 Pain in right foot: Secondary | ICD-10-CM | POA: Diagnosis not present

## 2016-09-18 DIAGNOSIS — M19071 Primary osteoarthritis, right ankle and foot: Secondary | ICD-10-CM | POA: Diagnosis not present

## 2016-09-18 DIAGNOSIS — M19072 Primary osteoarthritis, left ankle and foot: Secondary | ICD-10-CM | POA: Diagnosis not present

## 2016-10-25 DIAGNOSIS — N183 Chronic kidney disease, stage 3 (moderate): Secondary | ICD-10-CM | POA: Diagnosis not present

## 2016-10-25 DIAGNOSIS — D649 Anemia, unspecified: Secondary | ICD-10-CM | POA: Diagnosis not present

## 2016-10-25 DIAGNOSIS — M329 Systemic lupus erythematosus, unspecified: Secondary | ICD-10-CM | POA: Diagnosis not present

## 2016-10-25 DIAGNOSIS — E7439 Other disorders of intestinal carbohydrate absorption: Secondary | ICD-10-CM | POA: Diagnosis not present

## 2016-10-27 ENCOUNTER — Other Ambulatory Visit (HOSPITAL_COMMUNITY): Payer: Self-pay | Admitting: Nephrology

## 2016-10-27 DIAGNOSIS — N183 Chronic kidney disease, stage 3 unspecified: Secondary | ICD-10-CM

## 2016-10-31 DIAGNOSIS — M6281 Muscle weakness (generalized): Secondary | ICD-10-CM | POA: Diagnosis not present

## 2016-10-31 DIAGNOSIS — M069 Rheumatoid arthritis, unspecified: Secondary | ICD-10-CM | POA: Diagnosis not present

## 2016-10-31 DIAGNOSIS — M797 Fibromyalgia: Secondary | ICD-10-CM | POA: Diagnosis not present

## 2016-10-31 DIAGNOSIS — M329 Systemic lupus erythematosus, unspecified: Secondary | ICD-10-CM | POA: Diagnosis not present

## 2016-10-31 DIAGNOSIS — M79671 Pain in right foot: Secondary | ICD-10-CM | POA: Diagnosis not present

## 2016-10-31 DIAGNOSIS — Z79899 Other long term (current) drug therapy: Secondary | ICD-10-CM | POA: Diagnosis not present

## 2016-11-01 ENCOUNTER — Ambulatory Visit (HOSPITAL_COMMUNITY)
Admission: RE | Admit: 2016-11-01 | Discharge: 2016-11-01 | Disposition: A | Discharge status: Home / Self Care | Payer: PPO | Source: Ambulatory Visit | Attending: Nephrology | Admitting: Nephrology

## 2016-11-01 DIAGNOSIS — N183 Chronic kidney disease, stage 3 unspecified: Secondary | ICD-10-CM

## 2016-11-01 DIAGNOSIS — R932 Abnormal findings on diagnostic imaging of liver and biliary tract: Secondary | ICD-10-CM | POA: Diagnosis not present

## 2016-11-10 DIAGNOSIS — M059 Rheumatoid arthritis with rheumatoid factor, unspecified: Secondary | ICD-10-CM | POA: Diagnosis not present

## 2016-11-10 DIAGNOSIS — M329 Systemic lupus erythematosus, unspecified: Secondary | ICD-10-CM | POA: Diagnosis not present

## 2016-11-10 DIAGNOSIS — N183 Chronic kidney disease, stage 3 (moderate): Secondary | ICD-10-CM | POA: Diagnosis not present

## 2016-12-05 DIAGNOSIS — D631 Anemia in chronic kidney disease: Secondary | ICD-10-CM | POA: Diagnosis not present

## 2016-12-05 DIAGNOSIS — M329 Systemic lupus erythematosus, unspecified: Secondary | ICD-10-CM | POA: Diagnosis not present

## 2016-12-05 DIAGNOSIS — N183 Chronic kidney disease, stage 3 (moderate): Secondary | ICD-10-CM | POA: Diagnosis not present

## 2016-12-19 ENCOUNTER — Telehealth: Payer: Self-pay | Admitting: Podiatry

## 2016-12-19 NOTE — Telephone Encounter (Signed)
Called pt back in regards to message she left earlier today. Pt stated she just wanted the exact dates she was in our office this year. I told her she was seen on 05 April and 16 May. Pt thanked me for calling her back.

## 2016-12-19 NOTE — Telephone Encounter (Signed)
I came for the first time I think 05 April to see Dr. Jacqualyn Posey. I'm trying to follow up on my dates to the center. Please call me back at [phone]. Hope to hear from you soon.

## 2017-01-03 ENCOUNTER — Telehealth: Payer: Self-pay | Admitting: Pulmonary Disease

## 2017-01-03 NOTE — Telephone Encounter (Signed)
Spoke with pt, answered questions about Tessalon pearles and when it was prescribed. Nothing further is needed.   For your cough: Try Tessalon 100 mg up to 3 times a day as needed for cough I will check a chest x-ray I will check a lung function test I will check exhaled nitric oxide today You may need to have a CAT scan of your chest

## 2017-01-05 ENCOUNTER — Ambulatory Visit: Payer: PPO | Admitting: Podiatry

## 2017-01-05 DIAGNOSIS — N183 Chronic kidney disease, stage 3 (moderate): Secondary | ICD-10-CM | POA: Diagnosis not present

## 2017-01-22 DIAGNOSIS — M79671 Pain in right foot: Secondary | ICD-10-CM | POA: Diagnosis not present

## 2017-01-22 DIAGNOSIS — M329 Systemic lupus erythematosus, unspecified: Secondary | ICD-10-CM | POA: Diagnosis not present

## 2017-02-09 DIAGNOSIS — Z1231 Encounter for screening mammogram for malignant neoplasm of breast: Secondary | ICD-10-CM | POA: Diagnosis not present

## 2017-02-19 DIAGNOSIS — Z79899 Other long term (current) drug therapy: Secondary | ICD-10-CM | POA: Diagnosis not present

## 2017-02-21 DIAGNOSIS — H3589 Other specified retinal disorders: Secondary | ICD-10-CM | POA: Diagnosis not present

## 2017-02-21 DIAGNOSIS — I10 Essential (primary) hypertension: Secondary | ICD-10-CM | POA: Diagnosis not present

## 2017-02-21 DIAGNOSIS — H35462 Secondary vitreoretinal degeneration, left eye: Secondary | ICD-10-CM | POA: Diagnosis not present

## 2017-02-21 DIAGNOSIS — H35461 Secondary vitreoretinal degeneration, right eye: Secondary | ICD-10-CM | POA: Diagnosis not present

## 2017-02-21 DIAGNOSIS — H3540 Unspecified peripheral retinal degeneration: Secondary | ICD-10-CM | POA: Diagnosis not present

## 2017-02-21 DIAGNOSIS — Z79899 Other long term (current) drug therapy: Secondary | ICD-10-CM | POA: Diagnosis not present

## 2017-02-21 DIAGNOSIS — H35033 Hypertensive retinopathy, bilateral: Secondary | ICD-10-CM | POA: Diagnosis not present

## 2017-02-21 DIAGNOSIS — H524 Presbyopia: Secondary | ICD-10-CM | POA: Diagnosis not present

## 2017-02-21 DIAGNOSIS — H35463 Secondary vitreoretinal degeneration, bilateral: Secondary | ICD-10-CM | POA: Diagnosis not present

## 2017-02-21 DIAGNOSIS — H25813 Combined forms of age-related cataract, bilateral: Secondary | ICD-10-CM | POA: Diagnosis not present

## 2017-02-21 DIAGNOSIS — H2513 Age-related nuclear cataract, bilateral: Secondary | ICD-10-CM | POA: Diagnosis not present

## 2017-02-21 DIAGNOSIS — H25013 Cortical age-related cataract, bilateral: Secondary | ICD-10-CM | POA: Diagnosis not present

## 2017-03-05 DIAGNOSIS — D631 Anemia in chronic kidney disease: Secondary | ICD-10-CM | POA: Diagnosis not present

## 2017-03-05 DIAGNOSIS — N183 Chronic kidney disease, stage 3 (moderate): Secondary | ICD-10-CM | POA: Diagnosis not present

## 2017-03-05 DIAGNOSIS — M329 Systemic lupus erythematosus, unspecified: Secondary | ICD-10-CM | POA: Diagnosis not present

## 2017-04-13 DIAGNOSIS — Z299 Encounter for prophylactic measures, unspecified: Secondary | ICD-10-CM | POA: Diagnosis not present

## 2017-04-13 DIAGNOSIS — Z6831 Body mass index (BMI) 31.0-31.9, adult: Secondary | ICD-10-CM | POA: Diagnosis not present

## 2017-04-13 DIAGNOSIS — J849 Interstitial pulmonary disease, unspecified: Secondary | ICD-10-CM | POA: Diagnosis not present

## 2017-04-13 DIAGNOSIS — K219 Gastro-esophageal reflux disease without esophagitis: Secondary | ICD-10-CM | POA: Diagnosis not present

## 2017-04-13 DIAGNOSIS — R079 Chest pain, unspecified: Secondary | ICD-10-CM | POA: Diagnosis not present

## 2017-04-13 DIAGNOSIS — R05 Cough: Secondary | ICD-10-CM | POA: Diagnosis not present

## 2017-04-13 DIAGNOSIS — M940 Chondrocostal junction syndrome [Tietze]: Secondary | ICD-10-CM | POA: Diagnosis not present

## 2017-04-13 DIAGNOSIS — N182 Chronic kidney disease, stage 2 (mild): Secondary | ICD-10-CM | POA: Diagnosis not present

## 2017-04-13 DIAGNOSIS — I10 Essential (primary) hypertension: Secondary | ICD-10-CM | POA: Diagnosis not present

## 2017-04-13 DIAGNOSIS — M069 Rheumatoid arthritis, unspecified: Secondary | ICD-10-CM | POA: Diagnosis not present

## 2017-04-27 DIAGNOSIS — I10 Essential (primary) hypertension: Secondary | ICD-10-CM | POA: Diagnosis not present

## 2017-04-27 DIAGNOSIS — Z299 Encounter for prophylactic measures, unspecified: Secondary | ICD-10-CM | POA: Diagnosis not present

## 2017-04-27 DIAGNOSIS — Z2821 Immunization not carried out because of patient refusal: Secondary | ICD-10-CM | POA: Diagnosis not present

## 2017-04-27 DIAGNOSIS — Z6831 Body mass index (BMI) 31.0-31.9, adult: Secondary | ICD-10-CM | POA: Diagnosis not present

## 2017-04-27 DIAGNOSIS — K219 Gastro-esophageal reflux disease without esophagitis: Secondary | ICD-10-CM | POA: Diagnosis not present

## 2017-04-27 DIAGNOSIS — M069 Rheumatoid arthritis, unspecified: Secondary | ICD-10-CM | POA: Diagnosis not present

## 2017-05-01 DIAGNOSIS — M329 Systemic lupus erythematosus, unspecified: Secondary | ICD-10-CM | POA: Diagnosis not present

## 2017-05-01 DIAGNOSIS — R229 Localized swelling, mass and lump, unspecified: Secondary | ICD-10-CM | POA: Diagnosis not present

## 2017-05-01 DIAGNOSIS — M79671 Pain in right foot: Secondary | ICD-10-CM | POA: Diagnosis not present

## 2017-05-01 DIAGNOSIS — M797 Fibromyalgia: Secondary | ICD-10-CM | POA: Diagnosis not present

## 2017-05-01 DIAGNOSIS — Z23 Encounter for immunization: Secondary | ICD-10-CM | POA: Diagnosis not present

## 2017-05-01 DIAGNOSIS — M19012 Primary osteoarthritis, left shoulder: Secondary | ICD-10-CM | POA: Diagnosis not present

## 2017-05-01 DIAGNOSIS — M069 Rheumatoid arthritis, unspecified: Secondary | ICD-10-CM | POA: Diagnosis not present

## 2017-05-01 DIAGNOSIS — Z79899 Other long term (current) drug therapy: Secondary | ICD-10-CM | POA: Diagnosis not present

## 2017-05-18 DIAGNOSIS — R229 Localized swelling, mass and lump, unspecified: Secondary | ICD-10-CM | POA: Diagnosis not present

## 2017-05-18 DIAGNOSIS — Z299 Encounter for prophylactic measures, unspecified: Secondary | ICD-10-CM | POA: Diagnosis not present

## 2017-05-18 DIAGNOSIS — M069 Rheumatoid arthritis, unspecified: Secondary | ICD-10-CM | POA: Diagnosis not present

## 2017-05-18 DIAGNOSIS — Z789 Other specified health status: Secondary | ICD-10-CM | POA: Diagnosis not present

## 2017-05-18 DIAGNOSIS — I10 Essential (primary) hypertension: Secondary | ICD-10-CM | POA: Diagnosis not present

## 2017-05-18 DIAGNOSIS — Z6831 Body mass index (BMI) 31.0-31.9, adult: Secondary | ICD-10-CM | POA: Diagnosis not present

## 2017-06-19 DIAGNOSIS — M6281 Muscle weakness (generalized): Secondary | ICD-10-CM | POA: Diagnosis not present

## 2017-06-19 DIAGNOSIS — Z79899 Other long term (current) drug therapy: Secondary | ICD-10-CM | POA: Diagnosis not present

## 2017-06-20 DIAGNOSIS — D631 Anemia in chronic kidney disease: Secondary | ICD-10-CM | POA: Diagnosis not present

## 2017-06-20 DIAGNOSIS — N183 Chronic kidney disease, stage 3 (moderate): Secondary | ICD-10-CM | POA: Diagnosis not present

## 2017-06-20 DIAGNOSIS — M329 Systemic lupus erythematosus, unspecified: Secondary | ICD-10-CM | POA: Diagnosis not present

## 2017-08-20 ENCOUNTER — Ambulatory Visit: Payer: PPO | Admitting: Neurology

## 2017-09-28 DIAGNOSIS — G629 Polyneuropathy, unspecified: Secondary | ICD-10-CM | POA: Diagnosis not present

## 2017-09-28 DIAGNOSIS — M069 Rheumatoid arthritis, unspecified: Secondary | ICD-10-CM | POA: Diagnosis not present

## 2017-09-28 DIAGNOSIS — Z6832 Body mass index (BMI) 32.0-32.9, adult: Secondary | ICD-10-CM | POA: Diagnosis not present

## 2017-09-28 DIAGNOSIS — I10 Essential (primary) hypertension: Secondary | ICD-10-CM | POA: Diagnosis not present

## 2017-09-28 DIAGNOSIS — R002 Palpitations: Secondary | ICD-10-CM | POA: Diagnosis not present

## 2017-09-28 DIAGNOSIS — Z299 Encounter for prophylactic measures, unspecified: Secondary | ICD-10-CM | POA: Diagnosis not present

## 2017-10-05 DIAGNOSIS — G629 Polyneuropathy, unspecified: Secondary | ICD-10-CM | POA: Diagnosis not present

## 2017-10-16 DIAGNOSIS — I10 Essential (primary) hypertension: Secondary | ICD-10-CM | POA: Diagnosis not present

## 2017-10-16 DIAGNOSIS — Z6833 Body mass index (BMI) 33.0-33.9, adult: Secondary | ICD-10-CM | POA: Diagnosis not present

## 2017-10-16 DIAGNOSIS — R600 Localized edema: Secondary | ICD-10-CM | POA: Diagnosis not present

## 2017-10-16 DIAGNOSIS — M069 Rheumatoid arthritis, unspecified: Secondary | ICD-10-CM | POA: Diagnosis not present

## 2017-10-16 DIAGNOSIS — G629 Polyneuropathy, unspecified: Secondary | ICD-10-CM | POA: Diagnosis not present

## 2017-10-16 DIAGNOSIS — Z299 Encounter for prophylactic measures, unspecified: Secondary | ICD-10-CM | POA: Diagnosis not present

## 2017-10-19 ENCOUNTER — Other Ambulatory Visit: Payer: Self-pay

## 2017-10-19 NOTE — Patient Outreach (Signed)
[facility]) Care Management  10/19/2017  ROONEY GLADWIN 1950/02/27 [id number]   Referral Date:10/19/17 Referral Source: Nurseline Referral Reason: Blood pressure increased and dizziness   Outreach Attempt: No answer.  HIPAA compliant voice message left.    Plan: RN CM will send letter and attempt patient again within 4 days.   Jone Baseman, RN, MSN [facility] Care Management Care Management Coordinator Direct Line [phone] Toll Free: [phone]  Fax: [phone]

## 2017-10-21 DIAGNOSIS — M069 Rheumatoid arthritis, unspecified: Secondary | ICD-10-CM | POA: Diagnosis not present

## 2017-10-21 DIAGNOSIS — R531 Weakness: Secondary | ICD-10-CM | POA: Diagnosis not present

## 2017-10-21 DIAGNOSIS — E1140 Type 2 diabetes mellitus with diabetic neuropathy, unspecified: Secondary | ICD-10-CM | POA: Diagnosis not present

## 2017-10-21 DIAGNOSIS — J9811 Atelectasis: Secondary | ICD-10-CM | POA: Diagnosis not present

## 2017-10-21 DIAGNOSIS — D631 Anemia in chronic kidney disease: Secondary | ICD-10-CM | POA: Diagnosis not present

## 2017-10-21 DIAGNOSIS — I129 Hypertensive chronic kidney disease with stage 1 through stage 4 chronic kidney disease, or unspecified chronic kidney disease: Secondary | ICD-10-CM | POA: Diagnosis not present

## 2017-10-21 DIAGNOSIS — N390 Urinary tract infection, site not specified: Secondary | ICD-10-CM | POA: Diagnosis not present

## 2017-10-21 DIAGNOSIS — Z79899 Other long term (current) drug therapy: Secondary | ICD-10-CM | POA: Diagnosis not present

## 2017-10-21 DIAGNOSIS — N183 Chronic kidney disease, stage 3 (moderate): Secondary | ICD-10-CM | POA: Diagnosis not present

## 2017-10-21 DIAGNOSIS — M797 Fibromyalgia: Secondary | ICD-10-CM | POA: Diagnosis not present

## 2017-10-21 DIAGNOSIS — S80822A Blister (nonthermal), left lower leg, initial encounter: Secondary | ICD-10-CM | POA: Diagnosis not present

## 2017-10-21 DIAGNOSIS — R29898 Other symptoms and signs involving the musculoskeletal system: Secondary | ICD-10-CM | POA: Diagnosis not present

## 2017-10-21 DIAGNOSIS — E860 Dehydration: Secondary | ICD-10-CM | POA: Diagnosis not present

## 2017-10-21 DIAGNOSIS — E1122 Type 2 diabetes mellitus with diabetic chronic kidney disease: Secondary | ICD-10-CM | POA: Diagnosis not present

## 2017-10-23 ENCOUNTER — Other Ambulatory Visit: Payer: Self-pay

## 2017-10-23 NOTE — Patient Outreach (Signed)
[facility]) Care Management  10/23/2017  Andrea Leblanc 09-Jan-1951 [id number]   Referral Date:10/19/17 Referral Source: Nurseline Referral Reason: Blood pressure increased and dizziness  Referral Date: 10/23/17 Referral Source: nurse line Referral reason: Numbness to legs  Outreach Attempt: no answer.  Unable to leave a message.  Plan: RN CM will attempt patient again within 4 business days.  Jone Baseman, RN, MSN [place] Management Care Management Coordinator Direct Line [phone] Cell [phone] Toll Free: [phone]  Fax: [phone]

## 2017-10-26 ENCOUNTER — Other Ambulatory Visit: Payer: Self-pay

## 2017-10-26 NOTE — Patient Outreach (Signed)
[facility]) Care Management  10/26/2017  Andrea Leblanc [birth year] [id number]   Referral Date: 10/26/17 Referral Source: Nurseline Referral Reason: Increased BP and Numbness to legs.     Outreach Attempt: Spoke with patient.  She is able to verify HIPAA.  Patient reports that she has been admitted and discharged from [facility].  Patient states she went in on 10-21-17 and discharged 10-25-17.  Patient states she went in due to the numbness of her legs.  She states that she is some better and has appointment with her PCP on 9/17.   Patient has transportation.  She does not have home health as she states she does not need it.     She lives alone and has a brother who helps her.  Patient unable to review medications but states she takes them as prescribed.  Patient denies any questions or concerns.    Discussed [facility] services.  Patient declined needs at this time.     Plan: RN CM will close case.     Jone Baseman, RN, MSN [facility] Care Management Care Management Coordinator Direct Line [phone] Toll Free: [phone]  Fax: [phone]

## 2017-11-06 DIAGNOSIS — G629 Polyneuropathy, unspecified: Secondary | ICD-10-CM | POA: Diagnosis not present

## 2017-11-06 DIAGNOSIS — R42 Dizziness and giddiness: Secondary | ICD-10-CM | POA: Diagnosis not present

## 2017-11-06 DIAGNOSIS — R600 Localized edema: Secondary | ICD-10-CM | POA: Diagnosis not present

## 2017-11-06 DIAGNOSIS — Z299 Encounter for prophylactic measures, unspecified: Secondary | ICD-10-CM | POA: Diagnosis not present

## 2017-11-06 DIAGNOSIS — Z6833 Body mass index (BMI) 33.0-33.9, adult: Secondary | ICD-10-CM | POA: Diagnosis not present

## 2017-11-06 DIAGNOSIS — M069 Rheumatoid arthritis, unspecified: Secondary | ICD-10-CM | POA: Diagnosis not present

## 2017-11-06 DIAGNOSIS — I10 Essential (primary) hypertension: Secondary | ICD-10-CM | POA: Diagnosis not present

## 2017-11-13 DIAGNOSIS — H81393 Other peripheral vertigo, bilateral: Secondary | ICD-10-CM | POA: Diagnosis not present

## 2017-11-13 DIAGNOSIS — M79604 Pain in right leg: Secondary | ICD-10-CM | POA: Diagnosis not present

## 2017-11-13 DIAGNOSIS — M79605 Pain in left leg: Secondary | ICD-10-CM | POA: Diagnosis not present

## 2017-11-13 DIAGNOSIS — G9009 Other idiopathic peripheral autonomic neuropathy: Secondary | ICD-10-CM | POA: Diagnosis not present

## 2017-12-06 ENCOUNTER — Ambulatory Visit: Payer: PPO | Admitting: Neurology

## 2017-12-18 DIAGNOSIS — N182 Chronic kidney disease, stage 2 (mild): Secondary | ICD-10-CM | POA: Diagnosis not present

## 2017-12-18 DIAGNOSIS — R609 Edema, unspecified: Secondary | ICD-10-CM | POA: Diagnosis not present

## 2017-12-18 DIAGNOSIS — I10 Essential (primary) hypertension: Secondary | ICD-10-CM | POA: Diagnosis not present

## 2017-12-18 DIAGNOSIS — Z6832 Body mass index (BMI) 32.0-32.9, adult: Secondary | ICD-10-CM | POA: Diagnosis not present

## 2017-12-18 DIAGNOSIS — Z299 Encounter for prophylactic measures, unspecified: Secondary | ICD-10-CM | POA: Diagnosis not present

## 2017-12-18 DIAGNOSIS — R0602 Shortness of breath: Secondary | ICD-10-CM | POA: Diagnosis not present

## 2017-12-24 DIAGNOSIS — Z299 Encounter for prophylactic measures, unspecified: Secondary | ICD-10-CM | POA: Diagnosis not present

## 2017-12-24 DIAGNOSIS — M069 Rheumatoid arthritis, unspecified: Secondary | ICD-10-CM | POA: Diagnosis not present

## 2017-12-24 DIAGNOSIS — Z6832 Body mass index (BMI) 32.0-32.9, adult: Secondary | ICD-10-CM | POA: Diagnosis not present

## 2017-12-24 DIAGNOSIS — N182 Chronic kidney disease, stage 2 (mild): Secondary | ICD-10-CM | POA: Diagnosis not present

## 2017-12-24 DIAGNOSIS — G629 Polyneuropathy, unspecified: Secondary | ICD-10-CM | POA: Diagnosis not present

## 2017-12-24 DIAGNOSIS — Z2821 Immunization not carried out because of patient refusal: Secondary | ICD-10-CM | POA: Diagnosis not present

## 2017-12-31 DIAGNOSIS — M79604 Pain in right leg: Secondary | ICD-10-CM | POA: Diagnosis not present

## 2017-12-31 DIAGNOSIS — Z79899 Other long term (current) drug therapy: Secondary | ICD-10-CM | POA: Diagnosis not present

## 2017-12-31 DIAGNOSIS — M79671 Pain in right foot: Secondary | ICD-10-CM | POA: Diagnosis not present

## 2017-12-31 DIAGNOSIS — R202 Paresthesia of skin: Secondary | ICD-10-CM | POA: Diagnosis not present

## 2017-12-31 DIAGNOSIS — M329 Systemic lupus erythematosus, unspecified: Secondary | ICD-10-CM | POA: Diagnosis not present

## 2017-12-31 DIAGNOSIS — M797 Fibromyalgia: Secondary | ICD-10-CM | POA: Diagnosis not present

## 2017-12-31 DIAGNOSIS — M069 Rheumatoid arthritis, unspecified: Secondary | ICD-10-CM | POA: Diagnosis not present

## 2017-12-31 DIAGNOSIS — R229 Localized swelling, mass and lump, unspecified: Secondary | ICD-10-CM | POA: Diagnosis not present

## 2018-01-10 DIAGNOSIS — N189 Chronic kidney disease, unspecified: Secondary | ICD-10-CM | POA: Diagnosis not present

## 2018-01-10 DIAGNOSIS — M329 Systemic lupus erythematosus, unspecified: Secondary | ICD-10-CM | POA: Diagnosis not present

## 2018-01-10 DIAGNOSIS — N183 Chronic kidney disease, stage 3 (moderate): Secondary | ICD-10-CM | POA: Diagnosis not present

## 2018-01-10 DIAGNOSIS — D631 Anemia in chronic kidney disease: Secondary | ICD-10-CM | POA: Diagnosis not present

## 2018-01-29 DIAGNOSIS — L298 Other pruritus: Secondary | ICD-10-CM | POA: Diagnosis not present

## 2018-01-29 DIAGNOSIS — L139 Bullous disorder, unspecified: Secondary | ICD-10-CM | POA: Diagnosis not present

## 2018-01-29 DIAGNOSIS — S80822A Blister (nonthermal), left lower leg, initial encounter: Secondary | ICD-10-CM | POA: Diagnosis not present

## 2018-01-31 ENCOUNTER — Ambulatory Visit: Payer: PPO | Admitting: Neurology

## 2018-02-11 NOTE — Discharge Instructions (Signed)

## 2018-02-14 DIAGNOSIS — Z1231 Encounter for screening mammogram for malignant neoplasm of breast: Secondary | ICD-10-CM | POA: Diagnosis not present

## 2018-02-15 ENCOUNTER — Encounter (HOSPITAL_COMMUNITY)
Admission: RE | Admit: 2018-02-15 | Discharge: 2018-02-15 | Disposition: A | Discharge status: Home / Self Care | Payer: PPO | Source: Ambulatory Visit | Attending: Nephrology | Admitting: Nephrology

## 2018-02-15 ENCOUNTER — Encounter (HOSPITAL_COMMUNITY): Payer: Self-pay

## 2018-02-15 DIAGNOSIS — N183 Chronic kidney disease, stage 3 (moderate): Secondary | ICD-10-CM | POA: Diagnosis not present

## 2018-02-15 DIAGNOSIS — D631 Anemia in chronic kidney disease: Secondary | ICD-10-CM | POA: Diagnosis not present

## 2018-02-15 LAB — FERRITIN: Ferritin: 216 ng/mL (ref 11–307)

## 2018-02-15 LAB — IRON AND TIBC
IRON: 32 ug/dL (ref 28–170)
Saturation Ratios: 12 % (ref 10.4–31.8)
TIBC: 278 ug/dL (ref 250–450)
UIBC: 246 ug/dL

## 2018-02-15 LAB — POCT HEMOGLOBIN-HEMACUE: Hemoglobin: 10.5 g/dL — ABNORMAL LOW (ref 12.0–15.0)

## 2018-02-15 MED ORDER — DARBEPOETIN ALFA 60 MCG/0.3ML IJ SOSY
60.0000 ug | PREFILLED_SYRINGE | Freq: Once | INTRAMUSCULAR | Status: AC
  Administered 2018-02-15: 60 ug via SUBCUTANEOUS

## 2018-02-15 MED ORDER — DARBEPOETIN ALFA 60 MCG/0.3ML IJ SOSY
PREFILLED_SYRINGE | INTRAMUSCULAR | Status: AC
  Filled 2018-02-15: qty 0.3

## 2018-02-18 NOTE — Progress Notes (Signed)
Results for RHYLEI, MCQUAIG (MRN [id number]) as of 02/18/2018 14:31  Ref. Range 02/15/2018 15:12 02/15/2018 15:17  Iron Latest Ref Range: 28 - 170 ug/dL 32   UIBC Latest Units: ug/dL 246   TIBC Latest Ref Range: 250 - 450 ug/dL 278   Saturation Ratios Latest Ref Range: 10.4 - 31.8 % 12   Ferritin Latest Ref Range: 11 - 307 ng/mL 216   Hemoglobin Latest Ref Range: 12.0 - 15.0 g/dL  10.5 (L)

## 2018-02-18 NOTE — Progress Notes (Addendum)
02/18/2018 1430-pt phoned and wanted a copy of her lab results and vital signs from 02/15/18 appt, to take to Dr Dossie Der, Dr Manuella Ghazi and Dr Lorrene Reid appt's. Informed pt that I would fax lab results to Dr Lorrene Reid and Dr Manuella Ghazi. Pt wants me to make copy of vital signs and lab results and leave at front desk for her to pick up. I informed here I would leave at front desk in Short Stay. Voiced understanding.

## 2018-02-19 DIAGNOSIS — Z6832 Body mass index (BMI) 32.0-32.9, adult: Secondary | ICD-10-CM | POA: Diagnosis not present

## 2018-02-19 DIAGNOSIS — R202 Paresthesia of skin: Secondary | ICD-10-CM | POA: Diagnosis not present

## 2018-02-19 DIAGNOSIS — Z23 Encounter for immunization: Secondary | ICD-10-CM | POA: Diagnosis not present

## 2018-02-19 DIAGNOSIS — Z299 Encounter for prophylactic measures, unspecified: Secondary | ICD-10-CM | POA: Diagnosis not present

## 2018-02-19 DIAGNOSIS — I10 Essential (primary) hypertension: Secondary | ICD-10-CM | POA: Diagnosis not present

## 2018-02-19 DIAGNOSIS — Z79899 Other long term (current) drug therapy: Secondary | ICD-10-CM | POA: Diagnosis not present

## 2018-02-19 DIAGNOSIS — M797 Fibromyalgia: Secondary | ICD-10-CM | POA: Diagnosis not present

## 2018-02-19 DIAGNOSIS — M79606 Pain in leg, unspecified: Secondary | ICD-10-CM | POA: Diagnosis not present

## 2018-02-19 DIAGNOSIS — M329 Systemic lupus erythematosus, unspecified: Secondary | ICD-10-CM | POA: Diagnosis not present

## 2018-02-19 DIAGNOSIS — M79604 Pain in right leg: Secondary | ICD-10-CM | POA: Diagnosis not present

## 2018-02-19 DIAGNOSIS — R229 Localized swelling, mass and lump, unspecified: Secondary | ICD-10-CM | POA: Diagnosis not present

## 2018-02-19 DIAGNOSIS — M069 Rheumatoid arthritis, unspecified: Secondary | ICD-10-CM | POA: Diagnosis not present

## 2018-02-19 DIAGNOSIS — M79671 Pain in right foot: Secondary | ICD-10-CM | POA: Diagnosis not present

## 2018-03-15 ENCOUNTER — Ambulatory Visit (HOSPITAL_COMMUNITY)
Admission: RE | Admit: 2018-03-15 | Discharge: 2018-03-15 | Disposition: A | Discharge status: Home / Self Care | Payer: PPO | Source: Ambulatory Visit | Attending: Chiropractic Medicine | Admitting: Chiropractic Medicine

## 2018-03-15 ENCOUNTER — Encounter (HOSPITAL_COMMUNITY): Payer: Self-pay

## 2018-03-15 DIAGNOSIS — N183 Chronic kidney disease, stage 3 (moderate): Secondary | ICD-10-CM | POA: Insufficient documentation

## 2018-03-15 DIAGNOSIS — D631 Anemia in chronic kidney disease: Secondary | ICD-10-CM | POA: Insufficient documentation

## 2018-03-15 LAB — IRON AND TIBC
Iron: 60 ug/dL (ref 28–170)
Saturation Ratios: 20 % (ref 10.4–31.8)
TIBC: 298 ug/dL (ref 250–450)
UIBC: 238 ug/dL

## 2018-03-15 LAB — FERRITIN: Ferritin: 196 ng/mL (ref 11–307)

## 2018-03-15 LAB — POCT HEMOGLOBIN-HEMACUE: Hemoglobin: 9.9 g/dL — ABNORMAL LOW (ref 12.0–15.0)

## 2018-03-15 MED ORDER — DARBEPOETIN ALFA 60 MCG/0.3ML IJ SOSY
60.0000 ug | PREFILLED_SYRINGE | Freq: Once | INTRAMUSCULAR | Status: AC
  Administered 2018-03-15: 60 ug via SUBCUTANEOUS
  Filled 2018-03-15: qty 0.3

## 2018-03-18 NOTE — Progress Notes (Signed)
Labs drawn Friday routed to Dr Manuella Ghazi and Otho Ket, per patient request.

## 2018-04-09 DIAGNOSIS — N183 Chronic kidney disease, stage 3 (moderate): Secondary | ICD-10-CM | POA: Diagnosis not present

## 2018-04-09 DIAGNOSIS — M329 Systemic lupus erythematosus, unspecified: Secondary | ICD-10-CM | POA: Diagnosis not present

## 2018-04-09 DIAGNOSIS — D631 Anemia in chronic kidney disease: Secondary | ICD-10-CM | POA: Diagnosis not present

## 2018-04-15 ENCOUNTER — Encounter (HOSPITAL_COMMUNITY)
Admission: RE | Admit: 2018-04-15 | Discharge: 2018-04-15 | Disposition: A | Discharge status: Home / Self Care | Payer: PPO | Source: Ambulatory Visit | Attending: Nephrology | Admitting: Nephrology

## 2018-04-15 DIAGNOSIS — N183 Chronic kidney disease, stage 3 (moderate): Secondary | ICD-10-CM | POA: Diagnosis not present

## 2018-04-15 DIAGNOSIS — D631 Anemia in chronic kidney disease: Secondary | ICD-10-CM | POA: Diagnosis not present

## 2018-04-15 DIAGNOSIS — H524 Presbyopia: Secondary | ICD-10-CM | POA: Diagnosis not present

## 2018-04-15 LAB — IRON AND TIBC
Iron: 39 ug/dL (ref 28–170)
Saturation Ratios: 14 % (ref 10.4–31.8)
TIBC: 269 ug/dL (ref 250–450)
UIBC: 230 ug/dL

## 2018-04-15 LAB — FERRITIN: Ferritin: 159 ng/mL (ref 11–307)

## 2018-04-15 LAB — POCT HEMOGLOBIN-HEMACUE: Hemoglobin: 10.7 g/dL — ABNORMAL LOW (ref 12.0–15.0)

## 2018-04-15 MED ORDER — DARBEPOETIN ALFA 100 MCG/0.5ML IJ SOSY
PREFILLED_SYRINGE | INTRAMUSCULAR | Status: AC
  Filled 2018-04-15: qty 0.5

## 2018-04-15 MED ORDER — DARBEPOETIN ALFA 100 MCG/0.5ML IJ SOSY
100.0000 ug | PREFILLED_SYRINGE | Freq: Once | INTRAMUSCULAR | Status: AC
  Administered 2018-04-15: 100 ug via SUBCUTANEOUS

## 2018-04-15 NOTE — Progress Notes (Signed)
Pt was at eye dr this AM. BP 165/93 @ 0930 in left arm. They repeated the BP at 1120 and it was 184/85 in the left arm. No new orders done per pt. Pt also has copy of Dr Theodosia Blender office visit note and would like to have it scanned to her chart. Informed pt I would take care of having the note scanned to her chart in [facility]. Voiced understanding.Marland Kitchen

## 2018-04-22 DIAGNOSIS — Z299 Encounter for prophylactic measures, unspecified: Secondary | ICD-10-CM | POA: Diagnosis not present

## 2018-04-22 DIAGNOSIS — L97209 Non-pressure chronic ulcer of unspecified calf with unspecified severity: Secondary | ICD-10-CM | POA: Diagnosis not present

## 2018-04-22 DIAGNOSIS — Z1211 Encounter for screening for malignant neoplasm of colon: Secondary | ICD-10-CM | POA: Diagnosis not present

## 2018-04-22 DIAGNOSIS — Z1331 Encounter for screening for depression: Secondary | ICD-10-CM | POA: Diagnosis not present

## 2018-04-22 DIAGNOSIS — R5383 Other fatigue: Secondary | ICD-10-CM | POA: Diagnosis not present

## 2018-04-22 DIAGNOSIS — Z0000 Encounter for general adult medical examination without abnormal findings: Secondary | ICD-10-CM | POA: Diagnosis not present

## 2018-04-22 DIAGNOSIS — Z7189 Other specified counseling: Secondary | ICD-10-CM | POA: Diagnosis not present

## 2018-04-22 DIAGNOSIS — M069 Rheumatoid arthritis, unspecified: Secondary | ICD-10-CM | POA: Diagnosis not present

## 2018-04-22 DIAGNOSIS — Z6832 Body mass index (BMI) 32.0-32.9, adult: Secondary | ICD-10-CM | POA: Diagnosis not present

## 2018-04-22 DIAGNOSIS — I10 Essential (primary) hypertension: Secondary | ICD-10-CM | POA: Diagnosis not present

## 2018-04-22 DIAGNOSIS — Z1339 Encounter for screening examination for other mental health and behavioral disorders: Secondary | ICD-10-CM | POA: Diagnosis not present

## 2018-04-22 DIAGNOSIS — I83002 Varicose veins of unspecified lower extremity with ulcer of calf: Secondary | ICD-10-CM | POA: Diagnosis not present

## 2018-05-10 DIAGNOSIS — L97128 Non-pressure chronic ulcer of left thigh with other specified severity: Secondary | ICD-10-CM | POA: Diagnosis not present

## 2018-05-10 DIAGNOSIS — Z79899 Other long term (current) drug therapy: Secondary | ICD-10-CM | POA: Diagnosis not present

## 2018-05-13 ENCOUNTER — Encounter (HOSPITAL_COMMUNITY)
Admission: RE | Admit: 2018-05-13 | Discharge: 2018-05-13 | Disposition: A | Discharge status: Home / Self Care | Payer: PPO | Source: Ambulatory Visit | Attending: Nephrology | Admitting: Nephrology

## 2018-05-13 ENCOUNTER — Other Ambulatory Visit: Payer: Self-pay

## 2018-05-13 DIAGNOSIS — N183 Chronic kidney disease, stage 3 (moderate): Secondary | ICD-10-CM | POA: Insufficient documentation

## 2018-05-13 DIAGNOSIS — L97929 Non-pressure chronic ulcer of unspecified part of left lower leg with unspecified severity: Secondary | ICD-10-CM | POA: Diagnosis not present

## 2018-05-13 DIAGNOSIS — D631 Anemia in chronic kidney disease: Secondary | ICD-10-CM | POA: Diagnosis not present

## 2018-05-13 LAB — IRON AND TIBC
Iron: 56 ug/dL (ref 28–170)
Saturation Ratios: 19 % (ref 10.4–31.8)
TIBC: 287 ug/dL (ref 250–450)
UIBC: 231 ug/dL

## 2018-05-13 LAB — FERRITIN: Ferritin: 167 ng/mL (ref 11–307)

## 2018-05-13 LAB — POCT HEMOGLOBIN-HEMACUE: Hemoglobin: 10.9 g/dL — ABNORMAL LOW (ref 12.0–15.0)

## 2018-05-13 MED ORDER — DARBEPOETIN ALFA 100 MCG/0.5ML IJ SOSY
100.0000 ug | PREFILLED_SYRINGE | Freq: Once | INTRAMUSCULAR | Status: AC
  Administered 2018-05-13: 100 ug via SUBCUTANEOUS

## 2018-05-13 MED ORDER — DARBEPOETIN ALFA 100 MCG/0.5ML IJ SOSY
PREFILLED_SYRINGE | INTRAMUSCULAR | Status: AC
  Filled 2018-05-13: qty 0.5

## 2018-05-15 DIAGNOSIS — I872 Venous insufficiency (chronic) (peripheral): Secondary | ICD-10-CM | POA: Diagnosis not present

## 2018-05-15 DIAGNOSIS — M79605 Pain in left leg: Secondary | ICD-10-CM | POA: Diagnosis not present

## 2018-05-15 DIAGNOSIS — R600 Localized edema: Secondary | ICD-10-CM | POA: Diagnosis not present

## 2018-05-15 DIAGNOSIS — R2242 Localized swelling, mass and lump, left lower limb: Secondary | ICD-10-CM | POA: Diagnosis not present

## 2018-05-16 DIAGNOSIS — R600 Localized edema: Secondary | ICD-10-CM | POA: Diagnosis not present

## 2018-05-16 DIAGNOSIS — I872 Venous insufficiency (chronic) (peripheral): Secondary | ICD-10-CM | POA: Diagnosis not present

## 2018-05-24 DIAGNOSIS — M797 Fibromyalgia: Secondary | ICD-10-CM | POA: Diagnosis not present

## 2018-05-24 DIAGNOSIS — M329 Systemic lupus erythematosus, unspecified: Secondary | ICD-10-CM | POA: Diagnosis not present

## 2018-05-24 DIAGNOSIS — L98499 Non-pressure chronic ulcer of skin of other sites with unspecified severity: Secondary | ICD-10-CM | POA: Diagnosis not present

## 2018-06-06 DIAGNOSIS — J8489 Other specified interstitial pulmonary diseases: Secondary | ICD-10-CM | POA: Diagnosis not present

## 2018-06-06 DIAGNOSIS — G629 Polyneuropathy, unspecified: Secondary | ICD-10-CM | POA: Diagnosis not present

## 2018-06-06 DIAGNOSIS — D649 Anemia, unspecified: Secondary | ICD-10-CM | POA: Diagnosis not present

## 2018-06-06 DIAGNOSIS — M329 Systemic lupus erythematosus, unspecified: Secondary | ICD-10-CM | POA: Diagnosis not present

## 2018-06-06 DIAGNOSIS — R531 Weakness: Secondary | ICD-10-CM | POA: Diagnosis not present

## 2018-06-06 DIAGNOSIS — Z79899 Other long term (current) drug therapy: Secondary | ICD-10-CM | POA: Diagnosis not present

## 2018-06-06 DIAGNOSIS — K219 Gastro-esophageal reflux disease without esophagitis: Secondary | ICD-10-CM | POA: Diagnosis not present

## 2018-06-06 DIAGNOSIS — J84112 Idiopathic pulmonary fibrosis: Secondary | ICD-10-CM | POA: Diagnosis not present

## 2018-06-06 DIAGNOSIS — L97929 Non-pressure chronic ulcer of unspecified part of left lower leg with unspecified severity: Secondary | ICD-10-CM | POA: Diagnosis not present

## 2018-06-06 DIAGNOSIS — R21 Rash and other nonspecific skin eruption: Secondary | ICD-10-CM | POA: Diagnosis not present

## 2018-06-07 DIAGNOSIS — L97529 Non-pressure chronic ulcer of other part of left foot with unspecified severity: Secondary | ICD-10-CM | POA: Diagnosis not present

## 2018-06-07 DIAGNOSIS — I872 Venous insufficiency (chronic) (peripheral): Secondary | ICD-10-CM | POA: Diagnosis not present

## 2018-06-10 ENCOUNTER — Encounter (HOSPITAL_COMMUNITY)
Admission: RE | Admit: 2018-06-10 | Discharge: 2018-06-10 | Disposition: A | Discharge status: Home / Self Care | Payer: PPO | Source: Ambulatory Visit | Attending: Nephrology | Admitting: Nephrology

## 2018-06-10 ENCOUNTER — Encounter (HOSPITAL_COMMUNITY): Payer: Self-pay

## 2018-06-10 ENCOUNTER — Other Ambulatory Visit: Payer: Self-pay

## 2018-06-10 DIAGNOSIS — D631 Anemia in chronic kidney disease: Secondary | ICD-10-CM | POA: Insufficient documentation

## 2018-06-10 DIAGNOSIS — N183 Chronic kidney disease, stage 3 (moderate): Secondary | ICD-10-CM | POA: Diagnosis not present

## 2018-06-10 LAB — IRON AND TIBC
Iron: 72 ug/dL (ref 28–170)
Saturation Ratios: 25 % (ref 10.4–31.8)
TIBC: 291 ug/dL (ref 250–450)
UIBC: 219 ug/dL

## 2018-06-10 LAB — POCT HEMOGLOBIN-HEMACUE: Hemoglobin: 10.8 g/dL — ABNORMAL LOW (ref 12.0–15.0)

## 2018-06-10 LAB — FERRITIN: Ferritin: 160 ng/mL (ref 11–307)

## 2018-06-10 MED ORDER — DARBEPOETIN ALFA 100 MCG/0.5ML IJ SOSY
100.0000 ug | PREFILLED_SYRINGE | INTRAMUSCULAR | Status: DC
  Administered 2018-06-10: 100 ug via SUBCUTANEOUS
  Filled 2018-06-10: qty 0.5

## 2018-06-11 DIAGNOSIS — I872 Venous insufficiency (chronic) (peripheral): Secondary | ICD-10-CM | POA: Diagnosis not present

## 2018-06-11 DIAGNOSIS — L97929 Non-pressure chronic ulcer of unspecified part of left lower leg with unspecified severity: Secondary | ICD-10-CM | POA: Diagnosis not present

## 2018-06-24 DIAGNOSIS — I10 Essential (primary) hypertension: Secondary | ICD-10-CM | POA: Diagnosis not present

## 2018-06-24 DIAGNOSIS — M069 Rheumatoid arthritis, unspecified: Secondary | ICD-10-CM | POA: Diagnosis not present

## 2018-06-24 DIAGNOSIS — R05 Cough: Secondary | ICD-10-CM | POA: Diagnosis not present

## 2018-06-24 DIAGNOSIS — Z6832 Body mass index (BMI) 32.0-32.9, adult: Secondary | ICD-10-CM | POA: Diagnosis not present

## 2018-06-24 DIAGNOSIS — Z299 Encounter for prophylactic measures, unspecified: Secondary | ICD-10-CM | POA: Diagnosis not present

## 2018-06-25 DIAGNOSIS — L97929 Non-pressure chronic ulcer of unspecified part of left lower leg with unspecified severity: Secondary | ICD-10-CM | POA: Diagnosis not present

## 2018-06-25 DIAGNOSIS — I872 Venous insufficiency (chronic) (peripheral): Secondary | ICD-10-CM | POA: Diagnosis not present

## 2018-07-08 ENCOUNTER — Encounter (HOSPITAL_COMMUNITY)
Admission: RE | Admit: 2018-07-08 | Discharge: 2018-07-08 | Disposition: A | Discharge status: Home / Self Care | Payer: PPO | Source: Ambulatory Visit | Attending: Nephrology | Admitting: Nephrology

## 2018-07-08 ENCOUNTER — Other Ambulatory Visit: Payer: Self-pay

## 2018-07-08 ENCOUNTER — Encounter (HOSPITAL_COMMUNITY): Payer: Self-pay

## 2018-07-08 DIAGNOSIS — D631 Anemia in chronic kidney disease: Secondary | ICD-10-CM | POA: Diagnosis not present

## 2018-07-08 DIAGNOSIS — N183 Chronic kidney disease, stage 3 (moderate): Secondary | ICD-10-CM | POA: Insufficient documentation

## 2018-07-08 LAB — POCT HEMOGLOBIN-HEMACUE
Hemoglobin: 13.8 g/dL (ref 12.0–15.0)
Hemoglobin: 13.9 g/dL (ref 12.0–15.0)

## 2018-07-08 LAB — IRON AND TIBC
Iron: 59 ug/dL (ref 28–170)
Saturation Ratios: 20 % (ref 10.4–31.8)
TIBC: 292 ug/dL (ref 250–450)
UIBC: 233 ug/dL

## 2018-07-08 LAB — FERRITIN: Ferritin: 214 ng/mL (ref 11–307)

## 2018-07-08 MED ORDER — DARBEPOETIN ALFA 100 MCG/0.5ML IJ SOSY: 100.0000 ug | PREFILLED_SYRINGE | INTRAMUSCULAR | Status: DC

## 2018-07-11 DIAGNOSIS — Z79899 Other long term (current) drug therapy: Secondary | ICD-10-CM | POA: Diagnosis not present

## 2018-07-11 DIAGNOSIS — J849 Interstitial pulmonary disease, unspecified: Secondary | ICD-10-CM | POA: Diagnosis not present

## 2018-07-11 DIAGNOSIS — J8410 Pulmonary fibrosis, unspecified: Secondary | ICD-10-CM | POA: Diagnosis not present

## 2018-07-11 DIAGNOSIS — S81802S Unspecified open wound, left lower leg, sequela: Secondary | ICD-10-CM | POA: Diagnosis not present

## 2018-07-11 DIAGNOSIS — M069 Rheumatoid arthritis, unspecified: Secondary | ICD-10-CM | POA: Diagnosis not present

## 2018-07-11 DIAGNOSIS — Z8701 Personal history of pneumonia (recurrent): Secondary | ICD-10-CM | POA: Diagnosis not present

## 2018-07-11 DIAGNOSIS — D539 Nutritional anemia, unspecified: Secondary | ICD-10-CM | POA: Diagnosis not present

## 2018-07-11 DIAGNOSIS — M359 Systemic involvement of connective tissue, unspecified: Secondary | ICD-10-CM | POA: Diagnosis not present

## 2018-07-11 DIAGNOSIS — R05 Cough: Secondary | ICD-10-CM | POA: Diagnosis not present

## 2018-07-11 DIAGNOSIS — R7989 Other specified abnormal findings of blood chemistry: Secondary | ICD-10-CM | POA: Diagnosis not present

## 2018-07-11 DIAGNOSIS — G629 Polyneuropathy, unspecified: Secondary | ICD-10-CM | POA: Diagnosis not present

## 2018-07-11 DIAGNOSIS — N189 Chronic kidney disease, unspecified: Secondary | ICD-10-CM | POA: Diagnosis not present

## 2018-07-11 DIAGNOSIS — Z7952 Long term (current) use of systemic steroids: Secondary | ICD-10-CM | POA: Diagnosis not present

## 2018-07-12 DIAGNOSIS — Z6832 Body mass index (BMI) 32.0-32.9, adult: Secondary | ICD-10-CM | POA: Diagnosis not present

## 2018-07-12 DIAGNOSIS — Z713 Dietary counseling and surveillance: Secondary | ICD-10-CM | POA: Diagnosis not present

## 2018-07-12 DIAGNOSIS — I10 Essential (primary) hypertension: Secondary | ICD-10-CM | POA: Diagnosis not present

## 2018-07-12 DIAGNOSIS — Z299 Encounter for prophylactic measures, unspecified: Secondary | ICD-10-CM | POA: Diagnosis not present

## 2018-07-26 DIAGNOSIS — Z872 Personal history of diseases of the skin and subcutaneous tissue: Secondary | ICD-10-CM | POA: Diagnosis not present

## 2018-07-26 DIAGNOSIS — I872 Venous insufficiency (chronic) (peripheral): Secondary | ICD-10-CM | POA: Diagnosis not present

## 2018-08-12 ENCOUNTER — Encounter (HOSPITAL_COMMUNITY)
Admission: RE | Admit: 2018-08-12 | Discharge: 2018-08-12 | Disposition: A | Discharge status: Home / Self Care | Payer: PPO | Source: Ambulatory Visit | Attending: Nephrology | Admitting: Nephrology

## 2018-08-12 ENCOUNTER — Other Ambulatory Visit: Payer: Self-pay

## 2018-08-12 DIAGNOSIS — N183 Chronic kidney disease, stage 3 (moderate): Secondary | ICD-10-CM | POA: Diagnosis not present

## 2018-08-12 DIAGNOSIS — D631 Anemia in chronic kidney disease: Secondary | ICD-10-CM | POA: Insufficient documentation

## 2018-08-12 LAB — IRON AND TIBC
Iron: 69 ug/dL (ref 28–170)
Saturation Ratios: 23 % (ref 10.4–31.8)
TIBC: 294 ug/dL (ref 250–450)
UIBC: 225 ug/dL

## 2018-08-12 LAB — FERRITIN: Ferritin: 242 ng/mL (ref 11–307)

## 2018-08-12 LAB — POCT HEMOGLOBIN-HEMACUE: Hemoglobin: 11.6 g/dL — ABNORMAL LOW (ref 12.0–15.0)

## 2018-08-12 MED ORDER — DARBEPOETIN ALFA 60 MCG/0.3ML IJ SOSY
60.0000 ug | PREFILLED_SYRINGE | Freq: Once | INTRAMUSCULAR | Status: AC
  Administered 2018-08-12: 60 ug via SUBCUTANEOUS
  Filled 2018-08-12: qty 0.3

## 2018-08-26 DIAGNOSIS — Z01812 Encounter for preprocedural laboratory examination: Secondary | ICD-10-CM | POA: Diagnosis not present

## 2018-08-26 DIAGNOSIS — R05 Cough: Secondary | ICD-10-CM | POA: Diagnosis not present

## 2018-08-26 DIAGNOSIS — J849 Interstitial pulmonary disease, unspecified: Secondary | ICD-10-CM | POA: Diagnosis not present

## 2018-08-26 DIAGNOSIS — Z1159 Encounter for screening for other viral diseases: Secondary | ICD-10-CM | POA: Diagnosis not present

## 2018-08-26 DIAGNOSIS — M329 Systemic lupus erythematosus, unspecified: Secondary | ICD-10-CM | POA: Diagnosis not present

## 2018-09-02 DIAGNOSIS — J8410 Pulmonary fibrosis, unspecified: Secondary | ICD-10-CM | POA: Diagnosis not present

## 2018-09-02 DIAGNOSIS — G729 Myopathy, unspecified: Secondary | ICD-10-CM | POA: Diagnosis not present

## 2018-09-02 DIAGNOSIS — M3214 Glomerular disease in systemic lupus erythematosus: Secondary | ICD-10-CM | POA: Diagnosis not present

## 2018-09-02 DIAGNOSIS — J849 Interstitial pulmonary disease, unspecified: Secondary | ICD-10-CM | POA: Diagnosis not present

## 2018-09-02 DIAGNOSIS — Z720 Tobacco use: Secondary | ICD-10-CM | POA: Diagnosis not present

## 2018-09-02 DIAGNOSIS — M069 Rheumatoid arthritis, unspecified: Secondary | ICD-10-CM | POA: Diagnosis not present

## 2018-09-02 DIAGNOSIS — R05 Cough: Secondary | ICD-10-CM | POA: Diagnosis not present

## 2018-09-02 DIAGNOSIS — M359 Systemic involvement of connective tissue, unspecified: Secondary | ICD-10-CM | POA: Diagnosis not present

## 2018-09-02 DIAGNOSIS — G629 Polyneuropathy, unspecified: Secondary | ICD-10-CM | POA: Diagnosis not present

## 2018-09-02 DIAGNOSIS — M329 Systemic lupus erythematosus, unspecified: Secondary | ICD-10-CM | POA: Diagnosis not present

## 2018-09-05 DIAGNOSIS — J8410 Pulmonary fibrosis, unspecified: Secondary | ICD-10-CM | POA: Diagnosis not present

## 2018-09-05 DIAGNOSIS — R05 Cough: Secondary | ICD-10-CM | POA: Diagnosis not present

## 2018-09-05 DIAGNOSIS — Z6832 Body mass index (BMI) 32.0-32.9, adult: Secondary | ICD-10-CM | POA: Diagnosis not present

## 2018-09-05 DIAGNOSIS — Z299 Encounter for prophylactic measures, unspecified: Secondary | ICD-10-CM | POA: Diagnosis not present

## 2018-09-05 DIAGNOSIS — M069 Rheumatoid arthritis, unspecified: Secondary | ICD-10-CM | POA: Diagnosis not present

## 2018-09-09 ENCOUNTER — Other Ambulatory Visit (HOSPITAL_COMMUNITY): Payer: PPO

## 2018-09-09 ENCOUNTER — Ambulatory Visit (HOSPITAL_COMMUNITY): Payer: PPO

## 2018-09-09 DIAGNOSIS — Z87891 Personal history of nicotine dependence: Secondary | ICD-10-CM | POA: Diagnosis not present

## 2018-09-09 DIAGNOSIS — Z8709 Personal history of other diseases of the respiratory system: Secondary | ICD-10-CM | POA: Diagnosis not present

## 2018-09-09 DIAGNOSIS — K210 Gastro-esophageal reflux disease with esophagitis: Secondary | ICD-10-CM | POA: Diagnosis not present

## 2018-09-09 DIAGNOSIS — J8489 Other specified interstitial pulmonary diseases: Secondary | ICD-10-CM | POA: Diagnosis not present

## 2018-09-09 DIAGNOSIS — M329 Systemic lupus erythematosus, unspecified: Secondary | ICD-10-CM | POA: Diagnosis not present

## 2018-09-10 ENCOUNTER — Encounter (HOSPITAL_COMMUNITY)
Admission: RE | Admit: 2018-09-10 | Discharge: 2018-09-10 | Disposition: A | Discharge status: Home / Self Care | Payer: PPO | Source: Ambulatory Visit | Attending: Nephrology | Admitting: Nephrology

## 2018-09-10 ENCOUNTER — Other Ambulatory Visit: Payer: Self-pay

## 2018-09-10 DIAGNOSIS — N183 Chronic kidney disease, stage 3 (moderate): Secondary | ICD-10-CM | POA: Diagnosis not present

## 2018-09-10 DIAGNOSIS — D631 Anemia in chronic kidney disease: Secondary | ICD-10-CM | POA: Diagnosis not present

## 2018-09-10 LAB — IRON AND TIBC
Iron: 84 ug/dL (ref 28–170)
Saturation Ratios: 30 % (ref 10.4–31.8)
TIBC: 276 ug/dL (ref 250–450)
UIBC: 192 ug/dL

## 2018-09-10 LAB — POCT HEMOGLOBIN-HEMACUE: Hemoglobin: 10.4 g/dL — ABNORMAL LOW (ref 12.0–15.0)

## 2018-09-10 LAB — FERRITIN: Ferritin: 197 ng/mL (ref 11–307)

## 2018-09-10 MED ORDER — DARBEPOETIN ALFA 60 MCG/0.3ML IJ SOSY
PREFILLED_SYRINGE | INTRAMUSCULAR | Status: AC
  Filled 2018-09-10: qty 0.3

## 2018-09-10 MED ORDER — DARBEPOETIN ALFA 60 MCG/0.3ML IJ SOSY
60.0000 ug | PREFILLED_SYRINGE | INTRAMUSCULAR | Status: DC
  Administered 2018-09-10: 60 ug via SUBCUTANEOUS

## 2018-09-13 DIAGNOSIS — M069 Rheumatoid arthritis, unspecified: Secondary | ICD-10-CM | POA: Diagnosis not present

## 2018-09-13 DIAGNOSIS — J849 Interstitial pulmonary disease, unspecified: Secondary | ICD-10-CM | POA: Diagnosis not present

## 2018-09-13 DIAGNOSIS — M3214 Glomerular disease in systemic lupus erythematosus: Secondary | ICD-10-CM | POA: Diagnosis not present

## 2018-09-13 DIAGNOSIS — M329 Systemic lupus erythematosus, unspecified: Secondary | ICD-10-CM | POA: Diagnosis not present

## 2018-09-13 DIAGNOSIS — G629 Polyneuropathy, unspecified: Secondary | ICD-10-CM | POA: Diagnosis not present

## 2018-09-17 DIAGNOSIS — H25813 Combined forms of age-related cataract, bilateral: Secondary | ICD-10-CM | POA: Diagnosis not present

## 2018-09-23 ENCOUNTER — Other Ambulatory Visit: Payer: PPO

## 2018-09-23 ENCOUNTER — Other Ambulatory Visit: Payer: Self-pay

## 2018-09-23 DIAGNOSIS — Z20822 Contact with and (suspected) exposure to covid-19: Secondary | ICD-10-CM

## 2018-09-23 DIAGNOSIS — R6889 Other general symptoms and signs: Secondary | ICD-10-CM | POA: Diagnosis not present

## 2018-09-24 LAB — NOVEL CORONAVIRUS, NAA: SARS-CoV-2, NAA: NOT DETECTED

## 2018-09-25 DIAGNOSIS — Z79899 Other long term (current) drug therapy: Secondary | ICD-10-CM | POA: Diagnosis not present

## 2018-09-30 DIAGNOSIS — R200 Anesthesia of skin: Secondary | ICD-10-CM | POA: Diagnosis not present

## 2018-09-30 DIAGNOSIS — R531 Weakness: Secondary | ICD-10-CM | POA: Diagnosis not present

## 2018-09-30 DIAGNOSIS — R809 Proteinuria, unspecified: Secondary | ICD-10-CM | POA: Diagnosis not present

## 2018-09-30 DIAGNOSIS — D649 Anemia, unspecified: Secondary | ICD-10-CM | POA: Diagnosis not present

## 2018-09-30 DIAGNOSIS — M79672 Pain in left foot: Secondary | ICD-10-CM | POA: Diagnosis not present

## 2018-09-30 DIAGNOSIS — M329 Systemic lupus erythematosus, unspecified: Secondary | ICD-10-CM | POA: Diagnosis not present

## 2018-09-30 DIAGNOSIS — R05 Cough: Secondary | ICD-10-CM | POA: Diagnosis not present

## 2018-09-30 DIAGNOSIS — F17210 Nicotine dependence, cigarettes, uncomplicated: Secondary | ICD-10-CM | POA: Diagnosis not present

## 2018-09-30 DIAGNOSIS — M79671 Pain in right foot: Secondary | ICD-10-CM | POA: Diagnosis not present

## 2018-09-30 DIAGNOSIS — M351 Other overlap syndromes: Secondary | ICD-10-CM | POA: Diagnosis not present

## 2018-09-30 DIAGNOSIS — L97909 Non-pressure chronic ulcer of unspecified part of unspecified lower leg with unspecified severity: Secondary | ICD-10-CM | POA: Diagnosis not present

## 2018-09-30 DIAGNOSIS — Z7952 Long term (current) use of systemic steroids: Secondary | ICD-10-CM | POA: Diagnosis not present

## 2018-09-30 DIAGNOSIS — R0609 Other forms of dyspnea: Secondary | ICD-10-CM | POA: Diagnosis not present

## 2018-09-30 DIAGNOSIS — R9389 Abnormal findings on diagnostic imaging of other specified body structures: Secondary | ICD-10-CM | POA: Diagnosis not present

## 2018-09-30 DIAGNOSIS — R760 Raised antibody titer: Secondary | ICD-10-CM | POA: Diagnosis not present

## 2018-09-30 DIAGNOSIS — D638 Anemia in other chronic diseases classified elsewhere: Secondary | ICD-10-CM | POA: Diagnosis not present

## 2018-09-30 DIAGNOSIS — M797 Fibromyalgia: Secondary | ICD-10-CM | POA: Diagnosis not present

## 2018-09-30 DIAGNOSIS — G629 Polyneuropathy, unspecified: Secondary | ICD-10-CM | POA: Diagnosis not present

## 2018-09-30 DIAGNOSIS — R202 Paresthesia of skin: Secondary | ICD-10-CM | POA: Diagnosis not present

## 2018-09-30 DIAGNOSIS — Z8709 Personal history of other diseases of the respiratory system: Secondary | ICD-10-CM | POA: Diagnosis not present

## 2018-09-30 DIAGNOSIS — M79602 Pain in left arm: Secondary | ICD-10-CM | POA: Diagnosis not present

## 2018-09-30 DIAGNOSIS — M79601 Pain in right arm: Secondary | ICD-10-CM | POA: Diagnosis not present

## 2018-09-30 DIAGNOSIS — Z5181 Encounter for therapeutic drug level monitoring: Secondary | ICD-10-CM | POA: Diagnosis not present

## 2018-09-30 DIAGNOSIS — K219 Gastro-esophageal reflux disease without esophagitis: Secondary | ICD-10-CM | POA: Diagnosis not present

## 2018-09-30 DIAGNOSIS — L930 Discoid lupus erythematosus: Secondary | ICD-10-CM | POA: Diagnosis not present

## 2018-09-30 DIAGNOSIS — R29898 Other symptoms and signs involving the musculoskeletal system: Secondary | ICD-10-CM | POA: Diagnosis not present

## 2018-09-30 DIAGNOSIS — N189 Chronic kidney disease, unspecified: Secondary | ICD-10-CM | POA: Diagnosis not present

## 2018-09-30 DIAGNOSIS — R739 Hyperglycemia, unspecified: Secondary | ICD-10-CM | POA: Diagnosis not present

## 2018-09-30 DIAGNOSIS — Z79899 Other long term (current) drug therapy: Secondary | ICD-10-CM | POA: Diagnosis not present

## 2018-10-08 ENCOUNTER — Encounter (HOSPITAL_COMMUNITY): Payer: PPO

## 2018-10-16 ENCOUNTER — Encounter (HOSPITAL_COMMUNITY)
Admission: RE | Admit: 2018-10-16 | Discharge: 2018-10-16 | Disposition: A | Discharge status: Home / Self Care | Payer: PPO | Source: Ambulatory Visit | Attending: Nephrology | Admitting: Nephrology

## 2018-10-16 ENCOUNTER — Other Ambulatory Visit: Payer: Self-pay

## 2018-10-16 DIAGNOSIS — I129 Hypertensive chronic kidney disease with stage 1 through stage 4 chronic kidney disease, or unspecified chronic kidney disease: Secondary | ICD-10-CM | POA: Diagnosis not present

## 2018-10-16 DIAGNOSIS — K228 Other specified diseases of esophagus: Secondary | ICD-10-CM | POA: Diagnosis not present

## 2018-10-16 DIAGNOSIS — N189 Chronic kidney disease, unspecified: Secondary | ICD-10-CM | POA: Diagnosis not present

## 2018-10-16 DIAGNOSIS — N183 Chronic kidney disease, stage 3 (moderate): Secondary | ICD-10-CM | POA: Diagnosis not present

## 2018-10-16 DIAGNOSIS — D631 Anemia in chronic kidney disease: Secondary | ICD-10-CM | POA: Diagnosis not present

## 2018-10-16 DIAGNOSIS — J84112 Idiopathic pulmonary fibrosis: Secondary | ICD-10-CM | POA: Diagnosis not present

## 2018-10-16 DIAGNOSIS — M329 Systemic lupus erythematosus, unspecified: Secondary | ICD-10-CM | POA: Diagnosis not present

## 2018-10-16 LAB — IRON AND TIBC
Iron: 68 ug/dL (ref 28–170)
Saturation Ratios: 22 % (ref 10.4–31.8)
TIBC: 309 ug/dL (ref 250–450)
UIBC: 241 ug/dL

## 2018-10-16 LAB — POCT HEMOGLOBIN-HEMACUE: Hemoglobin: 10.2 g/dL — ABNORMAL LOW (ref 12.0–15.0)

## 2018-10-16 LAB — FERRITIN: Ferritin: 312 ng/mL — ABNORMAL HIGH (ref 11–307)

## 2018-10-16 MED ORDER — DARBEPOETIN ALFA 60 MCG/0.3ML IJ SOSY
PREFILLED_SYRINGE | INTRAMUSCULAR | Status: AC
  Filled 2018-10-16: qty 0.3

## 2018-10-16 MED ORDER — DARBEPOETIN ALFA 60 MCG/0.3ML IJ SOSY
60.0000 ug | PREFILLED_SYRINGE | INTRAMUSCULAR | Status: DC
  Administered 2018-10-16: 60 ug via SUBCUTANEOUS

## 2018-10-16 NOTE — Progress Notes (Signed)
Results for CHITARA, CLONCH (MRN [id number]) as of 10/16/2018 14:48  Ref. Range 10/16/2018 14:43  Hemoglobin Latest Ref Range: 12.0 - 15.0 g/dL 10.2 (L)

## 2018-10-24 DIAGNOSIS — N3289 Other specified disorders of bladder: Secondary | ICD-10-CM | POA: Diagnosis not present

## 2018-10-24 DIAGNOSIS — K219 Gastro-esophageal reflux disease without esophagitis: Secondary | ICD-10-CM | POA: Diagnosis not present

## 2018-10-24 DIAGNOSIS — N183 Chronic kidney disease, stage 3 (moderate): Secondary | ICD-10-CM | POA: Diagnosis not present

## 2018-10-31 ENCOUNTER — Other Ambulatory Visit: Payer: Self-pay

## 2018-10-31 DIAGNOSIS — H25812 Combined forms of age-related cataract, left eye: Secondary | ICD-10-CM | POA: Diagnosis not present

## 2018-10-31 DIAGNOSIS — Z20822 Contact with and (suspected) exposure to covid-19: Secondary | ICD-10-CM

## 2018-10-31 DIAGNOSIS — R6889 Other general symptoms and signs: Secondary | ICD-10-CM | POA: Diagnosis not present

## 2018-11-01 LAB — NOVEL CORONAVIRUS, NAA: SARS-CoV-2, NAA: NOT DETECTED

## 2018-11-05 DIAGNOSIS — G629 Polyneuropathy, unspecified: Secondary | ICD-10-CM | POA: Diagnosis not present

## 2018-11-06 DIAGNOSIS — R600 Localized edema: Secondary | ICD-10-CM | POA: Diagnosis not present

## 2018-11-06 DIAGNOSIS — I10 Essential (primary) hypertension: Secondary | ICD-10-CM | POA: Diagnosis not present

## 2018-11-06 DIAGNOSIS — Z299 Encounter for prophylactic measures, unspecified: Secondary | ICD-10-CM | POA: Diagnosis not present

## 2018-11-06 DIAGNOSIS — Z6833 Body mass index (BMI) 33.0-33.9, adult: Secondary | ICD-10-CM | POA: Diagnosis not present

## 2018-11-06 DIAGNOSIS — Z713 Dietary counseling and surveillance: Secondary | ICD-10-CM | POA: Diagnosis not present

## 2018-11-07 DIAGNOSIS — R05 Cough: Secondary | ICD-10-CM | POA: Diagnosis not present

## 2018-11-07 DIAGNOSIS — K220 Achalasia of cardia: Secondary | ICD-10-CM | POA: Diagnosis not present

## 2018-11-07 DIAGNOSIS — R12 Heartburn: Secondary | ICD-10-CM | POA: Diagnosis not present

## 2018-11-07 DIAGNOSIS — Z538 Procedure and treatment not carried out for other reasons: Secondary | ICD-10-CM | POA: Diagnosis not present

## 2018-11-07 DIAGNOSIS — R142 Eructation: Secondary | ICD-10-CM | POA: Diagnosis not present

## 2018-11-08 DIAGNOSIS — J84112 Idiopathic pulmonary fibrosis: Secondary | ICD-10-CM | POA: Diagnosis not present

## 2018-11-08 DIAGNOSIS — R208 Other disturbances of skin sensation: Secondary | ICD-10-CM | POA: Diagnosis not present

## 2018-11-08 DIAGNOSIS — Z87891 Personal history of nicotine dependence: Secondary | ICD-10-CM | POA: Diagnosis not present

## 2018-11-08 DIAGNOSIS — G6289 Other specified polyneuropathies: Secondary | ICD-10-CM | POA: Diagnosis not present

## 2018-11-11 DIAGNOSIS — K219 Gastro-esophageal reflux disease without esophagitis: Secondary | ICD-10-CM | POA: Diagnosis not present

## 2018-11-11 DIAGNOSIS — M329 Systemic lupus erythematosus, unspecified: Secondary | ICD-10-CM | POA: Diagnosis not present

## 2018-11-11 DIAGNOSIS — Z8709 Personal history of other diseases of the respiratory system: Secondary | ICD-10-CM | POA: Diagnosis not present

## 2018-11-11 DIAGNOSIS — J84112 Idiopathic pulmonary fibrosis: Secondary | ICD-10-CM | POA: Diagnosis not present

## 2018-11-13 ENCOUNTER — Encounter (HOSPITAL_COMMUNITY)
Admission: RE | Admit: 2018-11-13 | Discharge: 2018-11-13 | Disposition: A | Discharge status: Home / Self Care | Payer: PPO | Source: Ambulatory Visit | Attending: Nephrology | Admitting: Nephrology

## 2018-11-13 ENCOUNTER — Encounter (HOSPITAL_COMMUNITY): Payer: Self-pay

## 2018-11-13 ENCOUNTER — Other Ambulatory Visit: Payer: Self-pay

## 2018-11-13 DIAGNOSIS — N183 Chronic kidney disease, stage 3 (moderate): Secondary | ICD-10-CM | POA: Insufficient documentation

## 2018-11-13 DIAGNOSIS — D631 Anemia in chronic kidney disease: Secondary | ICD-10-CM | POA: Diagnosis not present

## 2018-11-13 LAB — RENAL FUNCTION PANEL
Albumin: 3.5 g/dL (ref 3.5–5.0)
Anion gap: 9 (ref 5–15)
BUN: 27 mg/dL — ABNORMAL HIGH (ref 8–23)
CO2: 26 mmol/L (ref 22–32)
Calcium: 9.2 mg/dL (ref 8.9–10.3)
Chloride: 105 mmol/L (ref 98–111)
Creatinine, Ser: 1.56 mg/dL — ABNORMAL HIGH (ref 0.44–1.00)
GFR calc Af Amer: 40 mL/min — ABNORMAL LOW (ref >60)
GFR calc non Af Amer: 34 mL/min — ABNORMAL LOW (ref >60)
Glucose, Bld: 120 mg/dL — ABNORMAL HIGH (ref 70–99)
Phosphorus: 2.1 mg/dL — ABNORMAL LOW (ref 2.5–4.6)
Potassium: 3.6 mmol/L (ref 3.5–5.1)
Sodium: 140 mmol/L (ref 135–145)

## 2018-11-13 LAB — FERRITIN: Ferritin: 175 ng/mL (ref 11–307)

## 2018-11-13 LAB — IRON AND TIBC
Iron: 63 ug/dL (ref 28–170)
Saturation Ratios: 21 % (ref 10.4–31.8)
TIBC: 296 ug/dL (ref 250–450)
UIBC: 233 ug/dL

## 2018-11-13 LAB — POCT HEMOGLOBIN-HEMACUE: Hemoglobin: 11.3 g/dL — ABNORMAL LOW (ref 12.0–15.0)

## 2018-11-13 MED ORDER — DARBEPOETIN ALFA 100 MCG/0.5ML IJ SOSY
100.0000 ug | PREFILLED_SYRINGE | Freq: Once | INTRAMUSCULAR | Status: AC
  Administered 2018-11-13: 15:00:00 100 ug via SUBCUTANEOUS
  Filled 2018-11-13: qty 0.5

## 2018-11-22 DIAGNOSIS — M3214 Glomerular disease in systemic lupus erythematosus: Secondary | ICD-10-CM | POA: Diagnosis not present

## 2018-11-22 DIAGNOSIS — G629 Polyneuropathy, unspecified: Secondary | ICD-10-CM | POA: Diagnosis not present

## 2018-11-22 DIAGNOSIS — Z79899 Other long term (current) drug therapy: Secondary | ICD-10-CM | POA: Diagnosis not present

## 2018-11-22 DIAGNOSIS — M069 Rheumatoid arthritis, unspecified: Secondary | ICD-10-CM | POA: Diagnosis not present

## 2018-11-22 DIAGNOSIS — M329 Systemic lupus erythematosus, unspecified: Secondary | ICD-10-CM | POA: Diagnosis not present

## 2018-11-24 DIAGNOSIS — J449 Chronic obstructive pulmonary disease, unspecified: Secondary | ICD-10-CM | POA: Diagnosis not present

## 2018-11-24 DIAGNOSIS — R0902 Hypoxemia: Secondary | ICD-10-CM | POA: Diagnosis not present

## 2018-12-10 ENCOUNTER — Other Ambulatory Visit: Payer: Self-pay

## 2018-12-10 ENCOUNTER — Encounter (HOSPITAL_COMMUNITY)
Admission: RE | Admit: 2018-12-10 | Discharge: 2018-12-10 | Disposition: A | Discharge status: Home / Self Care | Payer: PPO | Source: Ambulatory Visit | Attending: Nephrology | Admitting: Nephrology

## 2018-12-10 ENCOUNTER — Encounter (HOSPITAL_COMMUNITY): Payer: Self-pay

## 2018-12-10 DIAGNOSIS — D631 Anemia in chronic kidney disease: Secondary | ICD-10-CM | POA: Insufficient documentation

## 2018-12-10 DIAGNOSIS — N183 Chronic kidney disease, stage 3 unspecified: Secondary | ICD-10-CM | POA: Insufficient documentation

## 2018-12-10 LAB — HEPATIC FUNCTION PANEL
ALT: 24 U/L (ref 0–44)
AST: 32 U/L (ref 15–41)
Albumin: 3.7 g/dL (ref 3.5–5.0)
Alkaline Phosphatase: 60 U/L (ref 38–126)
Bilirubin, Direct: <0.1 mg/dL (ref 0.0–0.2)
Total Bilirubin: 0.4 mg/dL (ref 0.3–1.2)
Total Protein: 7.8 g/dL (ref 6.5–8.1)

## 2018-12-10 LAB — CBC WITH DIFFERENTIAL/PLATELET
Abs Immature Granulocytes: 0.07 10*3/uL (ref 0.00–0.07)
Basophils Absolute: 0 10*3/uL (ref 0.0–0.1)
Basophils Relative: 0 %
Eosinophils Absolute: 0 10*3/uL (ref 0.0–0.5)
Eosinophils Relative: 0 %
HCT: 37.2 % (ref 36.0–46.0)
Hemoglobin: 11.4 g/dL — ABNORMAL LOW (ref 12.0–15.0)
Immature Granulocytes: 1 %
Lymphocytes Relative: 9 %
Lymphs Abs: 0.6 10*3/uL — ABNORMAL LOW (ref 0.7–4.0)
MCH: 32.9 pg (ref 26.0–34.0)
MCHC: 30.6 g/dL (ref 30.0–36.0)
MCV: 107.5 fL — ABNORMAL HIGH (ref 80.0–100.0)
Monocytes Absolute: 0.5 10*3/uL (ref 0.1–1.0)
Monocytes Relative: 8 %
Neutro Abs: 5.3 10*3/uL (ref 1.7–7.7)
Neutrophils Relative %: 82 %
Platelets: 270 10*3/uL (ref 150–400)
RBC: 3.46 MIL/uL — ABNORMAL LOW (ref 3.87–5.11)
RDW: 14.3 % (ref 11.5–15.5)
WBC: 6.4 10*3/uL (ref 4.0–10.5)
nRBC: 0 % (ref 0.0–0.2)

## 2018-12-10 LAB — FERRITIN: Ferritin: 145 ng/mL (ref 11–307)

## 2018-12-10 LAB — IRON AND TIBC
Iron: 44 ug/dL (ref 28–170)
Saturation Ratios: 14 % (ref 10.4–31.8)
TIBC: 321 ug/dL (ref 250–450)
UIBC: 277 ug/dL

## 2018-12-10 LAB — POCT HEMOGLOBIN-HEMACUE: Hemoglobin: 11.4 g/dL — ABNORMAL LOW (ref 12.0–15.0)

## 2018-12-10 LAB — VITAMIN B12: Vitamin B-12: 967 pg/mL — ABNORMAL HIGH (ref 180–914)

## 2018-12-10 LAB — FOLATE: Folate: 55.1 ng/mL (ref >5.9)

## 2018-12-10 LAB — SEDIMENTATION RATE: Sed Rate: 68 mm/hr — ABNORMAL HIGH (ref 0–22)

## 2018-12-10 MED ORDER — DARBEPOETIN ALFA 100 MCG/0.5ML IJ SOSY
200.0000 ug | PREFILLED_SYRINGE | Freq: Once | INTRAMUSCULAR | Status: AC
  Administered 2018-12-10: 200 ug via SUBCUTANEOUS
  Filled 2018-12-10: qty 1

## 2018-12-11 ENCOUNTER — Inpatient Hospital Stay (HOSPITAL_COMMUNITY): Admission: RE | Admit: 2018-12-11 | Payer: PPO | Source: Ambulatory Visit

## 2018-12-11 ENCOUNTER — Ambulatory Visit (HOSPITAL_COMMUNITY): Payer: PPO

## 2018-12-20 DIAGNOSIS — H25812 Combined forms of age-related cataract, left eye: Secondary | ICD-10-CM | POA: Diagnosis not present

## 2018-12-20 DIAGNOSIS — H2512 Age-related nuclear cataract, left eye: Secondary | ICD-10-CM | POA: Diagnosis not present

## 2018-12-24 DIAGNOSIS — K220 Achalasia of cardia: Secondary | ICD-10-CM | POA: Diagnosis not present

## 2018-12-24 DIAGNOSIS — K229 Disease of esophagus, unspecified: Secondary | ICD-10-CM | POA: Diagnosis not present

## 2018-12-24 DIAGNOSIS — R05 Cough: Secondary | ICD-10-CM | POA: Diagnosis not present

## 2018-12-24 DIAGNOSIS — K224 Dyskinesia of esophagus: Secondary | ICD-10-CM | POA: Diagnosis not present

## 2018-12-24 DIAGNOSIS — R1111 Vomiting without nausea: Secondary | ICD-10-CM | POA: Diagnosis not present

## 2019-01-03 DIAGNOSIS — K229 Disease of esophagus, unspecified: Secondary | ICD-10-CM | POA: Diagnosis not present

## 2019-01-03 DIAGNOSIS — K224 Dyskinesia of esophagus: Secondary | ICD-10-CM | POA: Diagnosis not present

## 2019-01-03 DIAGNOSIS — Z7952 Long term (current) use of systemic steroids: Secondary | ICD-10-CM | POA: Diagnosis not present

## 2019-01-03 DIAGNOSIS — M3214 Glomerular disease in systemic lupus erythematosus: Secondary | ICD-10-CM | POA: Diagnosis not present

## 2019-01-03 DIAGNOSIS — J849 Interstitial pulmonary disease, unspecified: Secondary | ICD-10-CM | POA: Diagnosis not present

## 2019-01-03 DIAGNOSIS — Z79899 Other long term (current) drug therapy: Secondary | ICD-10-CM | POA: Diagnosis not present

## 2019-01-03 DIAGNOSIS — R1111 Vomiting without nausea: Secondary | ICD-10-CM | POA: Diagnosis not present

## 2019-01-03 DIAGNOSIS — K220 Achalasia of cardia: Secondary | ICD-10-CM | POA: Diagnosis not present

## 2019-01-03 DIAGNOSIS — M329 Systemic lupus erythematosus, unspecified: Secondary | ICD-10-CM | POA: Diagnosis not present

## 2019-01-31 DIAGNOSIS — H25811 Combined forms of age-related cataract, right eye: Secondary | ICD-10-CM | POA: Diagnosis not present

## 2019-01-31 DIAGNOSIS — H2511 Age-related nuclear cataract, right eye: Secondary | ICD-10-CM | POA: Diagnosis not present

## 2019-02-04 ENCOUNTER — Encounter (HOSPITAL_COMMUNITY): Payer: PPO

## 2019-02-10 ENCOUNTER — Encounter (HOSPITAL_COMMUNITY)
Admission: RE | Admit: 2019-02-10 | Discharge: 2019-02-10 | Disposition: A | Discharge status: Home / Self Care | Payer: PPO | Source: Ambulatory Visit | Attending: Nephrology | Admitting: Nephrology

## 2019-02-10 ENCOUNTER — Other Ambulatory Visit (HOSPITAL_COMMUNITY): Payer: Self-pay | Admitting: Internal Medicine

## 2019-02-10 ENCOUNTER — Other Ambulatory Visit: Payer: Self-pay

## 2019-02-10 DIAGNOSIS — N189 Chronic kidney disease, unspecified: Secondary | ICD-10-CM | POA: Diagnosis not present

## 2019-02-10 DIAGNOSIS — D631 Anemia in chronic kidney disease: Secondary | ICD-10-CM | POA: Diagnosis not present

## 2019-02-10 DIAGNOSIS — Z1231 Encounter for screening mammogram for malignant neoplasm of breast: Secondary | ICD-10-CM

## 2019-02-10 LAB — IRON AND TIBC
Iron: 77 ug/dL (ref 28–170)
Saturation Ratios: 25 % (ref 10.4–31.8)
TIBC: 311 ug/dL (ref 250–450)
UIBC: 234 ug/dL

## 2019-02-10 LAB — FERRITIN: Ferritin: 146 ng/mL (ref 11–307)

## 2019-02-10 LAB — POCT HEMOGLOBIN-HEMACUE: Hemoglobin: 12.2 g/dL (ref 12.0–15.0)

## 2019-02-12 DIAGNOSIS — K228 Other specified diseases of esophagus: Secondary | ICD-10-CM | POA: Diagnosis not present

## 2019-02-12 DIAGNOSIS — R1111 Vomiting without nausea: Secondary | ICD-10-CM | POA: Diagnosis not present

## 2019-02-20 ENCOUNTER — Ambulatory Visit (HOSPITAL_COMMUNITY)
Admission: RE | Admit: 2019-02-20 | Discharge: 2019-02-20 | Disposition: A | Discharge status: Home / Self Care | Payer: PPO | Source: Ambulatory Visit | Attending: Internal Medicine | Admitting: Internal Medicine

## 2019-02-20 ENCOUNTER — Other Ambulatory Visit: Payer: Self-pay

## 2019-02-20 DIAGNOSIS — Z1231 Encounter for screening mammogram for malignant neoplasm of breast: Secondary | ICD-10-CM | POA: Insufficient documentation

## 2019-03-13 DIAGNOSIS — R590 Localized enlarged lymph nodes: Secondary | ICD-10-CM | POA: Diagnosis not present

## 2019-03-13 DIAGNOSIS — J984 Other disorders of lung: Secondary | ICD-10-CM | POA: Diagnosis not present

## 2019-03-25 DIAGNOSIS — I129 Hypertensive chronic kidney disease with stage 1 through stage 4 chronic kidney disease, or unspecified chronic kidney disease: Secondary | ICD-10-CM | POA: Diagnosis not present

## 2019-03-25 DIAGNOSIS — D631 Anemia in chronic kidney disease: Secondary | ICD-10-CM | POA: Diagnosis not present

## 2019-03-25 DIAGNOSIS — K228 Other specified diseases of esophagus: Secondary | ICD-10-CM | POA: Diagnosis not present

## 2019-03-25 DIAGNOSIS — N183 Chronic kidney disease, stage 3 unspecified: Secondary | ICD-10-CM | POA: Diagnosis not present

## 2019-03-25 DIAGNOSIS — M329 Systemic lupus erythematosus, unspecified: Secondary | ICD-10-CM | POA: Diagnosis not present

## 2019-03-25 DIAGNOSIS — J84112 Idiopathic pulmonary fibrosis: Secondary | ICD-10-CM | POA: Diagnosis not present

## 2019-03-25 DIAGNOSIS — N189 Chronic kidney disease, unspecified: Secondary | ICD-10-CM | POA: Diagnosis not present

## 2019-03-28 ENCOUNTER — Ambulatory Visit: Payer: Medicare HMO | Attending: Internal Medicine

## 2019-03-28 ENCOUNTER — Other Ambulatory Visit: Payer: Self-pay

## 2019-03-28 DIAGNOSIS — R05 Cough: Secondary | ICD-10-CM | POA: Diagnosis not present

## 2019-03-28 DIAGNOSIS — I10 Essential (primary) hypertension: Secondary | ICD-10-CM | POA: Diagnosis not present

## 2019-03-28 DIAGNOSIS — M069 Rheumatoid arthritis, unspecified: Secondary | ICD-10-CM | POA: Diagnosis not present

## 2019-03-28 DIAGNOSIS — Z299 Encounter for prophylactic measures, unspecified: Secondary | ICD-10-CM | POA: Diagnosis not present

## 2019-03-28 DIAGNOSIS — Z20822 Contact with and (suspected) exposure to covid-19: Secondary | ICD-10-CM

## 2019-03-28 DIAGNOSIS — Z79899 Other long term (current) drug therapy: Secondary | ICD-10-CM | POA: Diagnosis not present

## 2019-03-28 DIAGNOSIS — E559 Vitamin D deficiency, unspecified: Secondary | ICD-10-CM | POA: Diagnosis not present

## 2019-03-28 DIAGNOSIS — Z6832 Body mass index (BMI) 32.0-32.9, adult: Secondary | ICD-10-CM | POA: Diagnosis not present

## 2019-03-28 DIAGNOSIS — R5383 Other fatigue: Secondary | ICD-10-CM | POA: Diagnosis not present

## 2019-03-28 DIAGNOSIS — Z789 Other specified health status: Secondary | ICD-10-CM | POA: Diagnosis not present

## 2019-03-28 DIAGNOSIS — G629 Polyneuropathy, unspecified: Secondary | ICD-10-CM | POA: Diagnosis not present

## 2019-03-30 LAB — NOVEL CORONAVIRUS, NAA: SARS-CoV-2, NAA: NOT DETECTED

## 2019-04-04 DIAGNOSIS — Z539 Procedure and treatment not carried out, unspecified reason: Secondary | ICD-10-CM | POA: Diagnosis not present

## 2019-04-04 DIAGNOSIS — K228 Other specified diseases of esophagus: Secondary | ICD-10-CM | POA: Diagnosis not present

## 2019-04-04 DIAGNOSIS — T18128A Food in esophagus causing other injury, initial encounter: Secondary | ICD-10-CM | POA: Diagnosis not present

## 2019-04-04 DIAGNOSIS — R1310 Dysphagia, unspecified: Secondary | ICD-10-CM | POA: Diagnosis not present

## 2019-04-04 DIAGNOSIS — K220 Achalasia of cardia: Secondary | ICD-10-CM | POA: Diagnosis not present

## 2019-04-07 ENCOUNTER — Other Ambulatory Visit: Payer: Self-pay

## 2019-04-07 ENCOUNTER — Encounter (HOSPITAL_COMMUNITY): Payer: Self-pay

## 2019-04-07 ENCOUNTER — Encounter (HOSPITAL_COMMUNITY)
Admission: RE | Admit: 2019-04-07 | Discharge: 2019-04-07 | Disposition: A | Discharge status: Home / Self Care | Payer: Medicare HMO | Source: Ambulatory Visit | Attending: Nephrology | Admitting: Nephrology

## 2019-04-07 DIAGNOSIS — N183 Chronic kidney disease, stage 3 unspecified: Secondary | ICD-10-CM | POA: Insufficient documentation

## 2019-04-07 DIAGNOSIS — K220 Achalasia of cardia: Secondary | ICD-10-CM | POA: Diagnosis not present

## 2019-04-07 DIAGNOSIS — J4520 Mild intermittent asthma, uncomplicated: Secondary | ICD-10-CM | POA: Diagnosis not present

## 2019-04-07 DIAGNOSIS — Z87891 Personal history of nicotine dependence: Secondary | ICD-10-CM | POA: Diagnosis not present

## 2019-04-07 DIAGNOSIS — D631 Anemia in chronic kidney disease: Secondary | ICD-10-CM | POA: Insufficient documentation

## 2019-04-07 LAB — IRON AND TIBC
Iron: 54 ug/dL (ref 28–170)
Saturation Ratios: 18 % (ref 10.4–31.8)
TIBC: 305 ug/dL (ref 250–450)
UIBC: 251 ug/dL

## 2019-04-07 LAB — FERRITIN: Ferritin: 228 ng/mL (ref 11–307)

## 2019-04-07 LAB — POCT HEMOGLOBIN-HEMACUE: Hemoglobin: 10.9 g/dL — ABNORMAL LOW (ref 12.0–15.0)

## 2019-04-07 MED ORDER — DARBEPOETIN ALFA 100 MCG/0.5ML IJ SOSY
200.0000 ug | PREFILLED_SYRINGE | Freq: Once | INTRAMUSCULAR | Status: AC
  Administered 2019-04-07: 14:00:00 200 ug via SUBCUTANEOUS
  Filled 2019-04-07: qty 1

## 2019-04-20 ENCOUNTER — Ambulatory Visit: Payer: Medicare HMO | Attending: Internal Medicine

## 2019-04-20 DIAGNOSIS — L930 Discoid lupus erythematosus: Secondary | ICD-10-CM | POA: Diagnosis not present

## 2019-04-20 DIAGNOSIS — G629 Polyneuropathy, unspecified: Secondary | ICD-10-CM | POA: Diagnosis not present

## 2019-04-20 DIAGNOSIS — Z23 Encounter for immunization: Secondary | ICD-10-CM

## 2019-04-20 NOTE — Progress Notes (Signed)
   Covid-19 Vaccination Clinic  Name:  Andrea Leblanc    MRN: [id number] DOB: February 28, 1950  04/20/2019  Andrea Leblanc was observed post Covid-19 immunization for 15 minutes without incidence. She was provided with Vaccine Information Sheet and instruction to access the V-Safe system.   Andrea Leblanc was instructed to call 911 with any severe reactions post vaccine: Marland Kitchen Difficulty breathing  . Swelling of your face and throat  . A fast heartbeat  . A bad rash all over your body  . Dizziness and weakness    Immunizations Administered    Name Date Dose VIS Date Route   Pfizer COVID-19 Vaccine 04/20/2019  2:06 PM 0.3 mL 01/31/2019 Intramuscular   Manufacturer: [place]   Lot: [id number]   [place]: [address]-3

## 2019-05-09 DIAGNOSIS — R05 Cough: Secondary | ICD-10-CM | POA: Diagnosis not present

## 2019-05-09 DIAGNOSIS — Z7952 Long term (current) use of systemic steroids: Secondary | ICD-10-CM | POA: Diagnosis not present

## 2019-05-09 DIAGNOSIS — R5382 Chronic fatigue, unspecified: Secondary | ICD-10-CM | POA: Diagnosis not present

## 2019-05-09 DIAGNOSIS — Z0389 Encounter for observation for other suspected diseases and conditions ruled out: Secondary | ICD-10-CM | POA: Diagnosis not present

## 2019-05-09 DIAGNOSIS — M329 Systemic lupus erythematosus, unspecified: Secondary | ICD-10-CM | POA: Diagnosis not present

## 2019-05-09 DIAGNOSIS — Z79899 Other long term (current) drug therapy: Secondary | ICD-10-CM | POA: Diagnosis not present

## 2019-05-09 DIAGNOSIS — K220 Achalasia of cardia: Secondary | ICD-10-CM | POA: Diagnosis not present

## 2019-05-19 ENCOUNTER — Ambulatory Visit: Payer: Medicare HMO | Attending: Internal Medicine

## 2019-05-19 ENCOUNTER — Ambulatory Visit: Payer: Medicare HMO

## 2019-05-19 DIAGNOSIS — Z23 Encounter for immunization: Secondary | ICD-10-CM

## 2019-05-19 NOTE — Progress Notes (Signed)
   Covid-19 Vaccination Clinic  Name:  Andrea Leblanc    MRN: [id number] DOB: [birth year]  05/19/2019  [name] was observed post Covid-19 immunization for 15 minutes without incident. She was provided with Vaccine Information Sheet and instruction to access the V-Safe system.   [name] was instructed to call 911 with any severe reactions post vaccine: Marland Kitchen Difficulty breathing  . Swelling of face and throat  . A fast heartbeat  . A bad rash all over body  . Dizziness and weakness   Immunizations Administered    Name Date Dose VIS Date Route   Pfizer COVID-19 Vaccine 05/19/2019 11:50 AM 0.3 mL 01/31/2019 Intramuscular   Manufacturer: [place]   Lot: [id number]   [place]: [address]-5

## 2019-06-02 ENCOUNTER — Encounter (HOSPITAL_COMMUNITY): Payer: Self-pay

## 2019-06-02 ENCOUNTER — Encounter (HOSPITAL_COMMUNITY)
Admission: RE | Admit: 2019-06-02 | Discharge: 2019-06-02 | Disposition: A | Discharge status: Home / Self Care | Payer: Medicare HMO | Source: Ambulatory Visit | Attending: Nephrology | Admitting: Nephrology

## 2019-06-02 ENCOUNTER — Other Ambulatory Visit: Payer: Self-pay

## 2019-06-02 DIAGNOSIS — N183 Chronic kidney disease, stage 3 unspecified: Secondary | ICD-10-CM | POA: Diagnosis not present

## 2019-06-02 DIAGNOSIS — D631 Anemia in chronic kidney disease: Secondary | ICD-10-CM | POA: Diagnosis not present

## 2019-06-02 LAB — FERRITIN: Ferritin: 153 ng/mL (ref 11–307)

## 2019-06-02 LAB — POCT HEMOGLOBIN-HEMACUE: Hemoglobin: 11.4 g/dL — ABNORMAL LOW (ref 12.0–15.0)

## 2019-06-02 LAB — IRON AND TIBC
Iron: 38 ug/dL (ref 28–170)
Saturation Ratios: 14 % (ref 10.4–31.8)
TIBC: 278 ug/dL (ref 250–450)
UIBC: 240 ug/dL

## 2019-06-02 MED ORDER — DARBEPOETIN ALFA 100 MCG/0.5ML IJ SOSY
200.0000 ug | PREFILLED_SYRINGE | Freq: Once | INTRAMUSCULAR | Status: AC
  Administered 2019-06-02: 15:00:00 200 ug via SUBCUTANEOUS
  Filled 2019-06-02: qty 1

## 2019-06-05 DIAGNOSIS — Z7952 Long term (current) use of systemic steroids: Secondary | ICD-10-CM | POA: Diagnosis not present

## 2019-06-05 DIAGNOSIS — R531 Weakness: Secondary | ICD-10-CM | POA: Diagnosis not present

## 2019-06-05 DIAGNOSIS — G629 Polyneuropathy, unspecified: Secondary | ICD-10-CM | POA: Diagnosis not present

## 2019-06-05 DIAGNOSIS — N189 Chronic kidney disease, unspecified: Secondary | ICD-10-CM | POA: Diagnosis not present

## 2019-06-05 DIAGNOSIS — J84112 Idiopathic pulmonary fibrosis: Secondary | ICD-10-CM | POA: Diagnosis not present

## 2019-06-05 DIAGNOSIS — M3214 Glomerular disease in systemic lupus erythematosus: Secondary | ICD-10-CM | POA: Diagnosis not present

## 2019-06-05 DIAGNOSIS — M797 Fibromyalgia: Secondary | ICD-10-CM | POA: Diagnosis not present

## 2019-06-05 DIAGNOSIS — J849 Interstitial pulmonary disease, unspecified: Secondary | ICD-10-CM | POA: Diagnosis not present

## 2019-06-05 DIAGNOSIS — M329 Systemic lupus erythematosus, unspecified: Secondary | ICD-10-CM | POA: Diagnosis not present

## 2019-06-05 DIAGNOSIS — R809 Proteinuria, unspecified: Secondary | ICD-10-CM | POA: Diagnosis not present

## 2019-06-05 DIAGNOSIS — Z79899 Other long term (current) drug therapy: Secondary | ICD-10-CM | POA: Diagnosis not present

## 2019-06-10 DIAGNOSIS — Z79899 Other long term (current) drug therapy: Secondary | ICD-10-CM | POA: Diagnosis not present

## 2019-06-10 DIAGNOSIS — M329 Systemic lupus erythematosus, unspecified: Secondary | ICD-10-CM | POA: Diagnosis not present

## 2019-06-10 DIAGNOSIS — Z961 Presence of intraocular lens: Secondary | ICD-10-CM | POA: Diagnosis not present

## 2019-06-20 DIAGNOSIS — G629 Polyneuropathy, unspecified: Secondary | ICD-10-CM | POA: Diagnosis not present

## 2019-06-20 DIAGNOSIS — L930 Discoid lupus erythematosus: Secondary | ICD-10-CM | POA: Diagnosis not present

## 2019-07-04 DIAGNOSIS — I10 Essential (primary) hypertension: Secondary | ICD-10-CM | POA: Diagnosis not present

## 2019-07-04 DIAGNOSIS — M069 Rheumatoid arthritis, unspecified: Secondary | ICD-10-CM | POA: Diagnosis not present

## 2019-07-04 DIAGNOSIS — Z299 Encounter for prophylactic measures, unspecified: Secondary | ICD-10-CM | POA: Diagnosis not present

## 2019-07-04 DIAGNOSIS — I519 Heart disease, unspecified: Secondary | ICD-10-CM | POA: Diagnosis not present

## 2019-07-04 DIAGNOSIS — M79671 Pain in right foot: Secondary | ICD-10-CM | POA: Diagnosis not present

## 2019-07-04 DIAGNOSIS — R5383 Other fatigue: Secondary | ICD-10-CM | POA: Diagnosis not present

## 2019-07-04 DIAGNOSIS — M79672 Pain in left foot: Secondary | ICD-10-CM | POA: Diagnosis not present

## 2019-07-28 ENCOUNTER — Encounter (HOSPITAL_COMMUNITY): Payer: Self-pay

## 2019-07-28 ENCOUNTER — Other Ambulatory Visit: Payer: Self-pay

## 2019-07-28 ENCOUNTER — Encounter (HOSPITAL_COMMUNITY)
Admission: RE | Admit: 2019-07-28 | Discharge: 2019-07-28 | Disposition: A | Discharge status: Home / Self Care | Payer: Medicare HMO | Source: Ambulatory Visit | Attending: Nephrology | Admitting: Nephrology

## 2019-07-28 DIAGNOSIS — D631 Anemia in chronic kidney disease: Secondary | ICD-10-CM | POA: Insufficient documentation

## 2019-07-28 DIAGNOSIS — N183 Chronic kidney disease, stage 3 unspecified: Secondary | ICD-10-CM | POA: Diagnosis not present

## 2019-07-28 LAB — CBC WITH DIFFERENTIAL/PLATELET
Abs Immature Granulocytes: 0.04 10*3/uL (ref 0.00–0.07)
Basophils Absolute: 0 10*3/uL (ref 0.0–0.1)
Basophils Relative: 1 %
Eosinophils Absolute: 0.1 10*3/uL (ref 0.0–0.5)
Eosinophils Relative: 2 %
HCT: 36.9 % (ref 36.0–46.0)
Hemoglobin: 11.4 g/dL — ABNORMAL LOW (ref 12.0–15.0)
Immature Granulocytes: 1 %
Lymphocytes Relative: 23 %
Lymphs Abs: 1.2 10*3/uL (ref 0.7–4.0)
MCH: 33.2 pg (ref 26.0–34.0)
MCHC: 30.9 g/dL (ref 30.0–36.0)
MCV: 107.6 fL — ABNORMAL HIGH (ref 80.0–100.0)
Monocytes Absolute: 0.6 10*3/uL (ref 0.1–1.0)
Monocytes Relative: 11 %
Neutro Abs: 3.4 10*3/uL (ref 1.7–7.7)
Neutrophils Relative %: 62 %
Platelets: 229 10*3/uL (ref 150–400)
RBC: 3.43 MIL/uL — ABNORMAL LOW (ref 3.87–5.11)
RDW: 14.8 % (ref 11.5–15.5)
WBC: 5.4 10*3/uL (ref 4.0–10.5)
nRBC: 0 % (ref 0.0–0.2)

## 2019-07-28 LAB — IRON AND TIBC
Iron: 80 ug/dL (ref 28–170)
Saturation Ratios: 27 % (ref 10.4–31.8)
TIBC: 292 ug/dL (ref 250–450)
UIBC: 212 ug/dL

## 2019-07-28 LAB — CREATININE, SERUM
Creatinine, Ser: 1.28 mg/dL — ABNORMAL HIGH (ref 0.44–1.00)
GFR calc Af Amer: 50 mL/min — ABNORMAL LOW (ref >60)
GFR calc non Af Amer: 43 mL/min — ABNORMAL LOW (ref >60)

## 2019-07-28 LAB — HEPATIC FUNCTION PANEL
ALT: 19 U/L (ref 0–44)
AST: 31 U/L (ref 15–41)
Albumin: 3.4 g/dL — ABNORMAL LOW (ref 3.5–5.0)
Alkaline Phosphatase: 61 U/L (ref 38–126)
Bilirubin, Direct: <0.1 mg/dL (ref 0.0–0.2)
Total Bilirubin: 0.4 mg/dL (ref 0.3–1.2)
Total Protein: 7.5 g/dL (ref 6.5–8.1)

## 2019-07-28 LAB — FERRITIN: Ferritin: 108 ng/mL (ref 11–307)

## 2019-07-28 LAB — POCT HEMOGLOBIN-HEMACUE: Hemoglobin: 11.1 g/dL — ABNORMAL LOW (ref 12.0–15.0)

## 2019-07-28 MED ORDER — DARBEPOETIN ALFA 100 MCG/0.5ML IJ SOSY
200.0000 ug | PREFILLED_SYRINGE | Freq: Once | INTRAMUSCULAR | Status: AC
  Administered 2019-07-28: 200 ug via SUBCUTANEOUS
  Filled 2019-07-28: qty 1

## 2019-08-20 DIAGNOSIS — I10 Essential (primary) hypertension: Secondary | ICD-10-CM | POA: Diagnosis not present

## 2019-08-20 DIAGNOSIS — K219 Gastro-esophageal reflux disease without esophagitis: Secondary | ICD-10-CM | POA: Diagnosis not present

## 2019-09-19 DIAGNOSIS — K219 Gastro-esophageal reflux disease without esophagitis: Secondary | ICD-10-CM | POA: Diagnosis not present

## 2019-09-19 DIAGNOSIS — I10 Essential (primary) hypertension: Secondary | ICD-10-CM | POA: Diagnosis not present

## 2019-09-22 ENCOUNTER — Encounter (HOSPITAL_COMMUNITY): Payer: Self-pay

## 2019-09-22 ENCOUNTER — Other Ambulatory Visit: Payer: Self-pay

## 2019-09-22 ENCOUNTER — Encounter (HOSPITAL_COMMUNITY)
Admission: RE | Admit: 2019-09-22 | Discharge: 2019-09-22 | Disposition: A | Discharge status: Home / Self Care | Payer: Medicare HMO | Source: Ambulatory Visit | Attending: Nephrology | Admitting: Nephrology

## 2019-09-22 DIAGNOSIS — M549 Dorsalgia, unspecified: Secondary | ICD-10-CM | POA: Diagnosis not present

## 2019-09-22 DIAGNOSIS — M329 Systemic lupus erythematosus, unspecified: Secondary | ICD-10-CM | POA: Insufficient documentation

## 2019-09-22 DIAGNOSIS — R531 Weakness: Secondary | ICD-10-CM | POA: Diagnosis not present

## 2019-09-22 DIAGNOSIS — G5793 Unspecified mononeuropathy of bilateral lower limbs: Secondary | ICD-10-CM | POA: Diagnosis not present

## 2019-09-22 LAB — POCT HEMOGLOBIN-HEMACUE: Hemoglobin: 10.1 g/dL — ABNORMAL LOW (ref 12.0–15.0)

## 2019-09-22 LAB — IRON AND TIBC
Iron: 46 ug/dL (ref 28–170)
Saturation Ratios: 16 % (ref 10.4–31.8)
TIBC: 289 ug/dL (ref 250–450)
UIBC: 243 ug/dL

## 2019-09-22 LAB — FERRITIN: Ferritin: 133 ng/mL (ref 11–307)

## 2019-09-22 MED ORDER — DARBEPOETIN ALFA 200 MCG/0.4ML IJ SOSY: 200.0000 ug | PREFILLED_SYRINGE | Freq: Once | INTRAMUSCULAR | Status: DC

## 2019-09-22 MED ORDER — DARBEPOETIN ALFA 100 MCG/0.5ML IJ SOSY: 100.0000 ug | PREFILLED_SYRINGE | Freq: Once | INTRAMUSCULAR | Status: DC

## 2019-09-22 MED ORDER — DARBEPOETIN ALFA 100 MCG/0.5ML IJ SOSY
200.0000 ug | PREFILLED_SYRINGE | Freq: Once | INTRAMUSCULAR | Status: AC
  Administered 2019-09-22: 200 ug via SUBCUTANEOUS

## 2019-09-22 MED ORDER — DARBEPOETIN ALFA 100 MCG/0.5ML IJ SOSY
PREFILLED_SYRINGE | INTRAMUSCULAR | Status: AC
  Filled 2019-09-22: qty 1

## 2019-09-24 DIAGNOSIS — L84 Corns and callosities: Secondary | ICD-10-CM | POA: Diagnosis not present

## 2019-09-24 DIAGNOSIS — N183 Chronic kidney disease, stage 3 unspecified: Secondary | ICD-10-CM | POA: Diagnosis not present

## 2019-09-24 DIAGNOSIS — M329 Systemic lupus erythematosus, unspecified: Secondary | ICD-10-CM | POA: Diagnosis not present

## 2019-09-24 DIAGNOSIS — R5381 Other malaise: Secondary | ICD-10-CM | POA: Diagnosis not present

## 2019-09-24 DIAGNOSIS — D631 Anemia in chronic kidney disease: Secondary | ICD-10-CM | POA: Diagnosis not present

## 2019-09-24 DIAGNOSIS — J84112 Idiopathic pulmonary fibrosis: Secondary | ICD-10-CM | POA: Diagnosis not present

## 2019-09-24 DIAGNOSIS — I129 Hypertensive chronic kidney disease with stage 1 through stage 4 chronic kidney disease, or unspecified chronic kidney disease: Secondary | ICD-10-CM | POA: Diagnosis not present

## 2019-10-14 ENCOUNTER — Encounter (HOSPITAL_COMMUNITY)
Admission: RE | Admit: 2019-10-14 | Discharge: 2019-10-14 | Disposition: A | Discharge status: Home / Self Care | Payer: Medicare HMO | Source: Ambulatory Visit | Attending: Internal Medicine | Admitting: Internal Medicine

## 2019-10-14 MED ORDER — SODIUM CHLORIDE 0.9 % IV SOLN: Freq: Once | INTRAVENOUS | Status: DC

## 2019-10-14 MED ORDER — SODIUM CHLORIDE 0.9 % IV SOLN
510.0000 mg | Freq: Once | INTRAVENOUS | Status: DC
  Filled 2019-10-14: qty 17

## 2019-10-15 ENCOUNTER — Encounter (HOSPITAL_COMMUNITY)
Admission: RE | Admit: 2019-10-15 | Discharge: 2019-10-15 | Disposition: A | Discharge status: Home / Self Care | Payer: Medicare HMO | Source: Ambulatory Visit | Attending: Internal Medicine | Admitting: Internal Medicine

## 2019-10-15 ENCOUNTER — Other Ambulatory Visit: Payer: Self-pay

## 2019-10-15 ENCOUNTER — Encounter (HOSPITAL_COMMUNITY): Payer: Self-pay

## 2019-10-15 DIAGNOSIS — I83002 Varicose veins of unspecified lower extremity with ulcer of calf: Secondary | ICD-10-CM | POA: Diagnosis not present

## 2019-10-15 DIAGNOSIS — G5793 Unspecified mononeuropathy of bilateral lower limbs: Secondary | ICD-10-CM | POA: Diagnosis not present

## 2019-10-15 DIAGNOSIS — M329 Systemic lupus erythematosus, unspecified: Secondary | ICD-10-CM | POA: Diagnosis not present

## 2019-10-15 DIAGNOSIS — L97209 Non-pressure chronic ulcer of unspecified calf with unspecified severity: Secondary | ICD-10-CM | POA: Diagnosis not present

## 2019-10-15 DIAGNOSIS — Z299 Encounter for prophylactic measures, unspecified: Secondary | ICD-10-CM | POA: Diagnosis not present

## 2019-10-15 DIAGNOSIS — M069 Rheumatoid arthritis, unspecified: Secondary | ICD-10-CM | POA: Diagnosis not present

## 2019-10-15 DIAGNOSIS — E1165 Type 2 diabetes mellitus with hyperglycemia: Secondary | ICD-10-CM | POA: Diagnosis not present

## 2019-10-15 DIAGNOSIS — K219 Gastro-esophageal reflux disease without esophagitis: Secondary | ICD-10-CM | POA: Diagnosis not present

## 2019-10-15 DIAGNOSIS — M549 Dorsalgia, unspecified: Secondary | ICD-10-CM | POA: Diagnosis not present

## 2019-10-15 DIAGNOSIS — J8410 Pulmonary fibrosis, unspecified: Secondary | ICD-10-CM | POA: Diagnosis not present

## 2019-10-15 DIAGNOSIS — I10 Essential (primary) hypertension: Secondary | ICD-10-CM | POA: Diagnosis not present

## 2019-10-15 DIAGNOSIS — R531 Weakness: Secondary | ICD-10-CM | POA: Diagnosis not present

## 2019-10-15 MED ORDER — SODIUM CHLORIDE 0.9 % IV SOLN: Freq: Once | INTRAVENOUS | Status: AC

## 2019-10-15 MED ORDER — SODIUM CHLORIDE 0.9 % IV SOLN
510.0000 mg | Freq: Once | INTRAVENOUS | Status: AC
  Administered 2019-10-15: 510 mg via INTRAVENOUS
  Filled 2019-10-15: qty 17

## 2019-10-16 DIAGNOSIS — Z79899 Other long term (current) drug therapy: Secondary | ICD-10-CM | POA: Diagnosis not present

## 2019-10-16 DIAGNOSIS — M549 Dorsalgia, unspecified: Secondary | ICD-10-CM | POA: Diagnosis not present

## 2019-10-16 DIAGNOSIS — M461 Sacroiliitis, not elsewhere classified: Secondary | ICD-10-CM | POA: Diagnosis not present

## 2019-10-16 DIAGNOSIS — M3214 Glomerular disease in systemic lupus erythematosus: Secondary | ICD-10-CM | POA: Diagnosis not present

## 2019-10-16 DIAGNOSIS — M19072 Primary osteoarthritis, left ankle and foot: Secondary | ICD-10-CM | POA: Diagnosis not present

## 2019-10-16 DIAGNOSIS — M47896 Other spondylosis, lumbar region: Secondary | ICD-10-CM | POA: Diagnosis not present

## 2019-10-16 DIAGNOSIS — G5793 Unspecified mononeuropathy of bilateral lower limbs: Secondary | ICD-10-CM | POA: Diagnosis not present

## 2019-10-16 DIAGNOSIS — M329 Systemic lupus erythematosus, unspecified: Secondary | ICD-10-CM | POA: Diagnosis not present

## 2019-10-16 DIAGNOSIS — M4312 Spondylolisthesis, cervical region: Secondary | ICD-10-CM | POA: Diagnosis not present

## 2019-10-16 DIAGNOSIS — J849 Interstitial pulmonary disease, unspecified: Secondary | ICD-10-CM | POA: Diagnosis not present

## 2019-10-16 DIAGNOSIS — M19071 Primary osteoarthritis, right ankle and foot: Secondary | ICD-10-CM | POA: Diagnosis not present

## 2019-10-16 DIAGNOSIS — R531 Weakness: Secondary | ICD-10-CM | POA: Diagnosis not present

## 2019-10-16 DIAGNOSIS — M160 Bilateral primary osteoarthritis of hip: Secondary | ICD-10-CM | POA: Diagnosis not present

## 2019-10-16 DIAGNOSIS — Z7952 Long term (current) use of systemic steroids: Secondary | ICD-10-CM | POA: Diagnosis not present

## 2019-10-16 DIAGNOSIS — M8588 Other specified disorders of bone density and structure, other site: Secondary | ICD-10-CM | POA: Diagnosis not present

## 2019-10-23 ENCOUNTER — Encounter (HOSPITAL_COMMUNITY): Payer: Self-pay | Admitting: Rheumatology

## 2019-10-23 ENCOUNTER — Other Ambulatory Visit: Payer: Self-pay

## 2019-10-23 ENCOUNTER — Encounter (HOSPITAL_COMMUNITY): Payer: Self-pay

## 2019-10-23 ENCOUNTER — Encounter (HOSPITAL_COMMUNITY)
Admission: RE | Admit: 2019-10-23 | Discharge: 2019-10-23 | Disposition: A | Discharge status: Home / Self Care | Payer: Medicare HMO | Source: Ambulatory Visit | Attending: Internal Medicine | Admitting: Internal Medicine

## 2019-10-23 DIAGNOSIS — N183 Chronic kidney disease, stage 3 unspecified: Secondary | ICD-10-CM | POA: Insufficient documentation

## 2019-10-23 DIAGNOSIS — D631 Anemia in chronic kidney disease: Secondary | ICD-10-CM | POA: Insufficient documentation

## 2019-10-23 LAB — URINALYSIS, ROUTINE W REFLEX MICROSCOPIC
Bilirubin Urine: NEGATIVE
Glucose, UA: NEGATIVE mg/dL
Hgb urine dipstick: NEGATIVE
Ketones, ur: NEGATIVE mg/dL
Leukocytes,Ua: NEGATIVE
Nitrite: NEGATIVE
Protein, ur: NEGATIVE mg/dL
Specific Gravity, Urine: 1.021 (ref 1.005–1.030)
pH: 5 (ref 5.0–8.0)

## 2019-10-23 LAB — CBC WITH DIFFERENTIAL/PLATELET
Abs Immature Granulocytes: 0.06 10*3/uL (ref 0.00–0.07)
Basophils Absolute: 0 10*3/uL (ref 0.0–0.1)
Basophils Relative: 0 %
Eosinophils Absolute: 0 10*3/uL (ref 0.0–0.5)
Eosinophils Relative: 1 %
HCT: 36 % (ref 36.0–46.0)
Hemoglobin: 11.6 g/dL — ABNORMAL LOW (ref 12.0–15.0)
Immature Granulocytes: 1 %
Lymphocytes Relative: 10 %
Lymphs Abs: 0.5 10*3/uL — ABNORMAL LOW (ref 0.7–4.0)
MCH: 34.7 pg — ABNORMAL HIGH (ref 26.0–34.0)
MCHC: 32.2 g/dL (ref 30.0–36.0)
MCV: 107.8 fL — ABNORMAL HIGH (ref 80.0–100.0)
Monocytes Absolute: 0.2 10*3/uL (ref 0.1–1.0)
Monocytes Relative: 4 %
Neutro Abs: 4.7 10*3/uL (ref 1.7–7.7)
Neutrophils Relative %: 84 %
Platelets: 201 10*3/uL (ref 150–400)
RBC: 3.34 MIL/uL — ABNORMAL LOW (ref 3.87–5.11)
RDW: 15.1 % (ref 11.5–15.5)
WBC: 5.6 10*3/uL (ref 4.0–10.5)
nRBC: 0 % (ref 0.0–0.2)

## 2019-10-23 LAB — COMPREHENSIVE METABOLIC PANEL
ALT: 26 U/L (ref 0–44)
AST: 29 U/L (ref 15–41)
Albumin: 3.5 g/dL (ref 3.5–5.0)
Alkaline Phosphatase: 64 U/L (ref 38–126)
Anion gap: 13 (ref 5–15)
BUN: 20 mg/dL (ref 8–23)
CO2: 19 mmol/L — ABNORMAL LOW (ref 22–32)
Calcium: 9.2 mg/dL (ref 8.9–10.3)
Chloride: 108 mmol/L (ref 98–111)
Creatinine, Ser: 1.33 mg/dL — ABNORMAL HIGH (ref 0.44–1.00)
GFR calc Af Amer: 48 mL/min — ABNORMAL LOW (ref >60)
GFR calc non Af Amer: 41 mL/min — ABNORMAL LOW (ref >60)
Glucose, Bld: 96 mg/dL (ref 70–99)
Potassium: 4 mmol/L (ref 3.5–5.1)
Sodium: 140 mmol/L (ref 135–145)
Total Bilirubin: 0.7 mg/dL (ref 0.3–1.2)
Total Protein: 7.5 g/dL (ref 6.5–8.1)

## 2019-10-23 LAB — SEDIMENTATION RATE: Sed Rate: 85 mm/hr — ABNORMAL HIGH (ref 0–22)

## 2019-10-23 LAB — CK: Total CK: 114 U/L (ref 38–234)

## 2019-10-23 LAB — PROTEIN / CREATININE RATIO, URINE
Creatinine, Urine: 213.23 mg/dL
Protein Creatinine Ratio: 0.15 mg/mg{Cre} (ref 0.00–0.15)
Total Protein, Urine: 31 mg/dL

## 2019-10-23 LAB — C-REACTIVE PROTEIN: CRP: 0.7 mg/dL (ref <1.0)

## 2019-10-23 MED ORDER — SODIUM CHLORIDE 0.9 % IV SOLN: Freq: Once | INTRAVENOUS | Status: AC

## 2019-10-23 MED ORDER — SODIUM CHLORIDE 0.9 % IV SOLN
510.0000 mg | Freq: Once | INTRAVENOUS | Status: AC
  Administered 2019-10-23: 510 mg via INTRAVENOUS
  Filled 2019-10-23: qty 510

## 2019-10-24 LAB — C4 COMPLEMENT: Complement C4, Body Fluid: 21 mg/dL (ref 12–38)

## 2019-10-24 LAB — C3 COMPLEMENT: C3 Complement: 105 mg/dL (ref 82–167)

## 2019-10-24 LAB — ALDOLASE: Aldolase: 2.7 U/L — ABNORMAL LOW (ref 3.3–10.3)

## 2019-10-24 LAB — ANTI-DNA ANTIBODY, DOUBLE-STRANDED: ds DNA Ab: 94 IU/mL — ABNORMAL HIGH (ref 0–9)

## 2019-10-29 LAB — MISC LABCORP TEST (SEND OUT): Labcorp test code: 520085

## 2019-11-10 DIAGNOSIS — M797 Fibromyalgia: Secondary | ICD-10-CM | POA: Diagnosis not present

## 2019-11-10 DIAGNOSIS — R609 Edema, unspecified: Secondary | ICD-10-CM | POA: Diagnosis not present

## 2019-11-10 DIAGNOSIS — J84112 Idiopathic pulmonary fibrosis: Secondary | ICD-10-CM | POA: Diagnosis not present

## 2019-11-10 DIAGNOSIS — N189 Chronic kidney disease, unspecified: Secondary | ICD-10-CM | POA: Diagnosis not present

## 2019-11-10 DIAGNOSIS — G629 Polyneuropathy, unspecified: Secondary | ICD-10-CM | POA: Diagnosis not present

## 2019-11-10 DIAGNOSIS — R29898 Other symptoms and signs involving the musculoskeletal system: Secondary | ICD-10-CM | POA: Diagnosis not present

## 2019-11-10 DIAGNOSIS — M545 Low back pain: Secondary | ICD-10-CM | POA: Diagnosis not present

## 2019-11-10 DIAGNOSIS — D638 Anemia in other chronic diseases classified elsewhere: Secondary | ICD-10-CM | POA: Diagnosis not present

## 2019-11-10 DIAGNOSIS — M47816 Spondylosis without myelopathy or radiculopathy, lumbar region: Secondary | ICD-10-CM | POA: Diagnosis not present

## 2019-11-10 DIAGNOSIS — M329 Systemic lupus erythematosus, unspecified: Secondary | ICD-10-CM | POA: Diagnosis not present

## 2019-11-10 DIAGNOSIS — R0602 Shortness of breath: Secondary | ICD-10-CM | POA: Diagnosis not present

## 2019-11-17 ENCOUNTER — Encounter (HOSPITAL_COMMUNITY): Payer: Self-pay | Admitting: Physical Therapy

## 2019-11-17 ENCOUNTER — Other Ambulatory Visit: Payer: Self-pay

## 2019-11-17 ENCOUNTER — Encounter (HOSPITAL_COMMUNITY): Payer: Medicare HMO

## 2019-11-17 ENCOUNTER — Encounter (HOSPITAL_COMMUNITY)
Admission: RE | Admit: 2019-11-17 | Discharge: 2019-11-17 | Disposition: A | Discharge status: Home / Self Care | Payer: Medicare HMO | Source: Ambulatory Visit | Attending: Nephrology | Admitting: Nephrology

## 2019-11-17 ENCOUNTER — Encounter (HOSPITAL_COMMUNITY): Payer: Self-pay

## 2019-11-17 ENCOUNTER — Ambulatory Visit (HOSPITAL_COMMUNITY): Payer: Medicare HMO | Admitting: Physical Therapy

## 2019-11-17 DIAGNOSIS — N183 Chronic kidney disease, stage 3 unspecified: Secondary | ICD-10-CM | POA: Diagnosis not present

## 2019-11-17 DIAGNOSIS — D631 Anemia in chronic kidney disease: Secondary | ICD-10-CM | POA: Diagnosis not present

## 2019-11-17 LAB — RENAL FUNCTION PANEL
Albumin: 3.5 g/dL (ref 3.5–5.0)
Anion gap: 11 (ref 5–15)
BUN: 22 mg/dL (ref 8–23)
CO2: 24 mmol/L (ref 22–32)
Calcium: 9 mg/dL (ref 8.9–10.3)
Chloride: 106 mmol/L (ref 98–111)
Creatinine, Ser: 1.49 mg/dL — ABNORMAL HIGH (ref 0.44–1.00)
GFR calc Af Amer: 42 mL/min — ABNORMAL LOW (ref >60)
GFR calc non Af Amer: 36 mL/min — ABNORMAL LOW (ref >60)
Glucose, Bld: 112 mg/dL — ABNORMAL HIGH (ref 70–99)
Phosphorus: 2.5 mg/dL (ref 2.5–4.6)
Potassium: 3.6 mmol/L (ref 3.5–5.1)
Sodium: 141 mmol/L (ref 135–145)

## 2019-11-17 LAB — POCT HEMOGLOBIN-HEMACUE: Hemoglobin: 12.2 g/dL (ref 12.0–15.0)

## 2019-11-17 MED ORDER — DARBEPOETIN ALFA 100 MCG/0.5ML IJ SOSY: 200.0000 ug | PREFILLED_SYRINGE | Freq: Once | INTRAMUSCULAR | Status: DC

## 2019-11-17 NOTE — Therapy (Signed)
[place], [place], [address] Phone: [phone]   Fax:  [phone]  Patient Details  Name: Andrea Leblanc MRN: [id number] Date of Birth: [birth year] Referring Provider:  Justin Mend, MD  Encounter Date: 11/17/2019 Subjective:  Objective - no objective information obtained on this date Education - on use of life alert or phone to get off floor as patient cannot prop herself up on floor to assist in getting up on floor  Assessment: Physical therapy not warranted at this time secondary to patient's other medical priorities and decreased desire to participate in therapy secondary to past experiences with therapy. Educated patient and spent 40 minutes with patient to discuss possible benefits of therapy but no therapy or assessment took place and patient eventually left without being treated/evaluated on this date.   2:26 PM, 11/17/19 Jerene Pitch, DPT Physical Therapy with [facility]  [phone] office  [place] [address] [place], [place], [address] Phone: [phone]   Fax:  [phone]

## 2019-11-18 LAB — PARATHYROID HORMONE, INTACT (NO CA): PTH: 33 pg/mL (ref 15–65)

## 2019-11-28 DIAGNOSIS — Z6839 Body mass index (BMI) 39.0-39.9, adult: Secondary | ICD-10-CM | POA: Diagnosis not present

## 2019-11-28 DIAGNOSIS — M545 Low back pain, unspecified: Secondary | ICD-10-CM | POA: Diagnosis not present

## 2019-11-28 DIAGNOSIS — Z299 Encounter for prophylactic measures, unspecified: Secondary | ICD-10-CM | POA: Diagnosis not present

## 2019-11-28 DIAGNOSIS — J8410 Pulmonary fibrosis, unspecified: Secondary | ICD-10-CM | POA: Diagnosis not present

## 2019-11-28 DIAGNOSIS — M069 Rheumatoid arthritis, unspecified: Secondary | ICD-10-CM | POA: Diagnosis not present

## 2019-11-28 DIAGNOSIS — I10 Essential (primary) hypertension: Secondary | ICD-10-CM | POA: Diagnosis not present

## 2019-12-09 DIAGNOSIS — I10 Essential (primary) hypertension: Secondary | ICD-10-CM | POA: Diagnosis not present

## 2019-12-09 DIAGNOSIS — Z299 Encounter for prophylactic measures, unspecified: Secondary | ICD-10-CM | POA: Diagnosis not present

## 2019-12-09 DIAGNOSIS — Z6832 Body mass index (BMI) 32.0-32.9, adult: Secondary | ICD-10-CM | POA: Diagnosis not present

## 2019-12-09 DIAGNOSIS — M069 Rheumatoid arthritis, unspecified: Secondary | ICD-10-CM | POA: Diagnosis not present

## 2019-12-09 DIAGNOSIS — E1165 Type 2 diabetes mellitus with hyperglycemia: Secondary | ICD-10-CM | POA: Diagnosis not present

## 2020-01-05 DIAGNOSIS — Z8739 Personal history of other diseases of the musculoskeletal system and connective tissue: Secondary | ICD-10-CM | POA: Diagnosis not present

## 2020-01-05 DIAGNOSIS — G6289 Other specified polyneuropathies: Secondary | ICD-10-CM | POA: Diagnosis not present

## 2020-01-05 DIAGNOSIS — G629 Polyneuropathy, unspecified: Secondary | ICD-10-CM | POA: Diagnosis not present

## 2020-01-05 DIAGNOSIS — R531 Weakness: Secondary | ICD-10-CM | POA: Diagnosis not present

## 2020-01-05 DIAGNOSIS — R0602 Shortness of breath: Secondary | ICD-10-CM | POA: Diagnosis not present

## 2020-01-11 DIAGNOSIS — J984 Other disorders of lung: Secondary | ICD-10-CM | POA: Diagnosis not present

## 2020-01-11 DIAGNOSIS — R0689 Other abnormalities of breathing: Secondary | ICD-10-CM | POA: Diagnosis not present

## 2020-01-12 ENCOUNTER — Encounter (HOSPITAL_COMMUNITY): Payer: Self-pay

## 2020-01-12 ENCOUNTER — Encounter (HOSPITAL_COMMUNITY)
Admission: RE | Admit: 2020-01-12 | Discharge: 2020-01-12 | Disposition: A | Discharge status: Home / Self Care | Payer: Medicare HMO | Source: Ambulatory Visit | Attending: Nephrology | Admitting: Nephrology

## 2020-01-12 ENCOUNTER — Other Ambulatory Visit: Payer: Self-pay

## 2020-01-12 DIAGNOSIS — D631 Anemia in chronic kidney disease: Secondary | ICD-10-CM | POA: Insufficient documentation

## 2020-01-12 DIAGNOSIS — N183 Chronic kidney disease, stage 3 unspecified: Secondary | ICD-10-CM | POA: Diagnosis not present

## 2020-01-12 HISTORY — DX: Chronic kidney disease, unspecified: N18.9

## 2020-01-12 HISTORY — DX: Anemia, unspecified: D64.9

## 2020-01-12 LAB — IRON AND TIBC
Iron: 81 ug/dL (ref 28–170)
Saturation Ratios: 31 % (ref 10.4–31.8)
TIBC: 263 ug/dL (ref 250–450)
UIBC: 182 ug/dL

## 2020-01-12 LAB — POCT HEMOGLOBIN-HEMACUE: Hemoglobin: 11.8 g/dL — ABNORMAL LOW (ref 12.0–15.0)

## 2020-01-12 LAB — FERRITIN: Ferritin: 484 ng/mL — ABNORMAL HIGH (ref 11–307)

## 2020-01-12 MED ORDER — DARBEPOETIN ALFA 100 MCG/0.5ML IJ SOSY
PREFILLED_SYRINGE | INTRAMUSCULAR | Status: AC
  Filled 2020-01-12: qty 1

## 2020-01-12 MED ORDER — DARBEPOETIN ALFA 100 MCG/0.5ML IJ SOSY
100.0000 ug | PREFILLED_SYRINGE | Freq: Once | INTRAMUSCULAR | Status: AC
  Administered 2020-01-12: 100 ug via SUBCUTANEOUS

## 2020-01-12 MED ORDER — DARBEPOETIN ALFA 200 MCG/0.4ML IJ SOSY
200.0000 ug | PREFILLED_SYRINGE | Freq: Once | INTRAMUSCULAR | Status: DC
  Filled 2020-01-12: qty 0.4

## 2020-02-06 ENCOUNTER — Other Ambulatory Visit: Payer: Self-pay

## 2020-02-06 ENCOUNTER — Ambulatory Visit (INDEPENDENT_AMBULATORY_CARE_PROVIDER_SITE_OTHER): Payer: Medicare HMO | Admitting: Podiatry

## 2020-02-06 ENCOUNTER — Ambulatory Visit (INDEPENDENT_AMBULATORY_CARE_PROVIDER_SITE_OTHER): Payer: Medicare HMO

## 2020-02-06 DIAGNOSIS — M21611 Bunion of right foot: Secondary | ICD-10-CM | POA: Diagnosis not present

## 2020-02-06 DIAGNOSIS — M216X1 Other acquired deformities of right foot: Secondary | ICD-10-CM

## 2020-02-06 DIAGNOSIS — M21619 Bunion of unspecified foot: Secondary | ICD-10-CM | POA: Diagnosis not present

## 2020-02-06 DIAGNOSIS — Z01818 Encounter for other preprocedural examination: Secondary | ICD-10-CM | POA: Diagnosis not present

## 2020-02-06 DIAGNOSIS — Z79899 Other long term (current) drug therapy: Secondary | ICD-10-CM | POA: Diagnosis not present

## 2020-02-06 DIAGNOSIS — M21621 Bunionette of right foot: Secondary | ICD-10-CM

## 2020-02-10 ENCOUNTER — Encounter: Payer: Self-pay | Admitting: Podiatry

## 2020-02-10 NOTE — Progress Notes (Signed)
Subjective:  Patient ID: Andrea Leblanc, female    DOB: 1950-10-25,  MRN: [id number]  Chief Complaint  Patient presents with  . Callouses    Bilateral callous causing pain both on lateral side of feet. Ongoing problem     69 y.o. female presents with the above complaint.  Patient presents with complaint of right submetatarsal 5 lesion as well as lateral bunion pain.  Patient states that is painful to touch.  Patient states it hurts because she walks more the side of the foot.  She is try some shoe gear modification offloading padding none of which has helped.  She would like to discuss surgical options as she is very tired of the metatarsal/the lesion that she is developing with every step.  She denies seeing anyone else prior to seeing me.  She denies any other treatment options.  She would like to discuss surgical option at this time.   Review of Systems: Negative except as noted in the HPI. Denies N/V/F/Ch.  Past Medical History:  Diagnosis Date  . Anemia   . Asthma   . Chronic kidney disease   . Cough variant asthma   . Diabetes mellitus, type II ([place])   . Hammer toe   . Uterine fibroid     Current Outpatient Medications:  .  amLODipine (NORVASC) 5 MG tablet, Take 5 mg by mouth daily., Disp: , Rfl:  .  Belimumab (BENLYSTA) 200 MG/ML SOAJ, Inject 200 mg into the skin once a week., Disp: , Rfl:  .  benazepril-hydrochlorthiazide (LOTENSIN HCT) 20-12.5 MG tablet, , Disp: , Rfl:  .  betamethasone dipropionate 0.05 % lotion, Apply topically 2 (two) times daily., Disp: , Rfl:  .  Biotin 5000 MCG CAPS, Take by mouth daily., Disp: , Rfl:  .  budesonide-formoterol (SYMBICORT) 160-4.5 MCG/ACT inhaler, Inhale 2 puffs into the lungs 2 (two) times daily., Disp: , Rfl:  .  calcium-vitamin D (OSCAL WITH D) 500-200 MG-UNIT tablet, Take 1 tablet by mouth., Disp: , Rfl:  .  Cholecalciferol (VITAMIN D3 PO), Take 2,000 Int'l Units by mouth daily., Disp: , Rfl:  .  clobetasol (TEMOVATE) 0.05 %  external solution, Apply 1 application topically 2 (two) times daily., Disp: , Rfl:  .  co-enzyme Q-10 30 MG capsule, Take 30 mg by mouth daily., Disp: , Rfl:  .  Darbepoetin Alfa (ARANESP) 100 MCG/0.5ML SOSY injection, Inject 200 mcg into the skin every 8 (eight) weeks. , Disp: , Rfl:  .  Darbepoetin Alfa (ARANESP) 200 MCG/0.4ML SOSY injection, Inject 200 mcg into the skin every 8 (eight) weeks. , Disp: , Rfl:  .  diphenhydrAMINE (BENADRYL) 25 mg capsule, Take 25 mg by mouth every 6 (six) hours as needed (one or two tabs)., Disp: , Rfl:  .  DULoxetine (CYMBALTA) 60 MG capsule, Take 60 mg by mouth daily., Disp: , Rfl:  .  epoetin alfa-epbx (RETACRIT) 40981 UNIT/ML injection, 20,000 Units every 30 (thirty) days., Disp: , Rfl:  .  Ferrous Sulfate (IRON) 325 (65 Fe) MG TABS, Take by mouth daily., Disp: , Rfl:  .  fluticasone (FLONASE) 50 MCG/ACT nasal spray, Place 2 sprays into both nostrils 2 (two) times daily., Disp: , Rfl:  .  furosemide (LASIX) 40 MG tablet, Take 40 mg by mouth., Disp: , Rfl:  .  gabapentin (NEURONTIN) 300 MG capsule, 600mg  qam, 300mg  at noon, 600mg  at night, Disp: , Rfl:  .  gabapentin (NEURONTIN) 600 MG tablet, Take 600 mg by mouth 3 (three) times daily. ,  Disp: , Rfl:  .  gabapentin (NEURONTIN) 800 MG tablet, Take 800 mg by mouth 3 (three) times daily. , Disp: , Rfl:  .  halobetasol (ULTRAVATE) 0.05 % cream, Apply topically 2 (two) times daily., Disp: , Rfl:  .  hydroxychloroquine (PLAQUENIL) 200 MG tablet, Take by mouth 2 (two) times daily., Disp: , Rfl:  .  loratadine (CLARITIN) 10 MG tablet, Take 10 mg by mouth daily., Disp: , Rfl:  .  magnesium oxide (MAG-OX) 400 MG tablet, Take 400 mg by mouth daily., Disp: , Rfl:  .  methocarbamol (ROBAXIN) 750 MG tablet, Take 750 mg by mouth 2 (two) times daily as needed for muscle spasms., Disp: , Rfl:  .  METHOTREXATE PO, Take by mouth., Disp: , Rfl:  .  MISC NATURAL PRODUCTS PO, Take 2 tablets by mouth daily., Disp: , Rfl:  .   montelukast (SINGULAIR) 10 MG tablet, Take 10 mg by mouth at bedtime., Disp: , Rfl:  .  Omega 3-6-9 Fatty Acids (TRIPLE OMEGA COMPLEX PO), 500mg  krill, 500mg  fish oil, 500iu vitamin D - Take two tablets daily., Disp: , Rfl:  .  Omega-3 Fatty Acids (OMEGA-3 FISH OIL PO), Take one capsule daily., Disp: , Rfl:  .  omeprazole (PRILOSEC) 40 MG capsule, Take 40 mg by mouth daily., Disp: , Rfl:  .  pantoprazole (PROTONIX) 40 MG tablet, Take 40 mg by mouth daily., Disp: , Rfl:  .  predniSONE (DELTASONE) 5 MG tablet, Take 7 mg by mouth daily with breakfast. , Disp: , Rfl:  .  RABEprazole (ACIPHEX) 20 MG tablet, Take 20 mg by mouth daily., Disp: , Rfl:  .  sertraline (ZOLOFT) 25 MG tablet, Take 25 mg by mouth daily., Disp: , Rfl:  .  traMADol (ULTRAM) 50 MG tablet, Take 50 mg by mouth 3 (three) times daily as needed., Disp: , Rfl:  .  vitamin B-12 (CYANOCOBALAMIN) 1000 MCG tablet, Take 1,000 mcg by mouth daily., Disp: , Rfl:   Social History   Tobacco Use  Smoking Status Former Smoker  . Types: Cigarettes  . Quit date: 01/31/1986  . Years since quitting: 34.0  Smokeless Tobacco Never Used  Tobacco Comment   pt unsure of how long or how much she smoked betofre quitting.     No Known Allergies Objective:  There were no vitals filed for this visit. There is no height or weight on file to calculate BMI. Constitutional Well developed. Well nourished.  Vascular Dorsalis pedis pulses palpable bilaterally. Posterior tibial pulses palpable bilaterally. Capillary refill normal to all digits.  No cyanosis or clubbing noted. Pedal hair growth normal.  Neurologic Normal speech. Oriented to person, place, and time. Epicritic sensation to light touch grossly present bilaterally.  Dermatologic Nails well groomed and normal in appearance. No open wounds. No skin lesions.  Orthopedic:  Pain on palpation right medial prominence of the fifth metatarsal as well as plantar submetatarsal 5 lesion.  No pain  with range of motion of the MPJ joint of the fifth.  No extensor or flexor tendinitis noted.   Radiographs: 3 views of skeletally mature adult foot: Cavus foot structure noted with midfoot arthrosis as well as posterior heel spurring noted.  Severe plantarflexed forefoot noted.  There is slight increase in lateral deviation angle.  There is an increase in intermetatarsal angle between the fourth and fifth.  No other bony abnormalities identified Assessment:   1. Tailor's bunionette, right   2. Plantar flexed metatarsal, right    Plan:  Patient  was evaluated and treated and all questions answered.  Right submetatarsal 5 hyperkeratotic lesion with underlying tailor's bunion -I explained to the patient the etiology of the bunion and various treatment options were discussed.  Driving force behind this is a plantarflexed metatarsal of the fifth leading to excessive pressure to the submetatarsal 5.  I believe patient will benefit from offloading osteotomy of the fifth without fixation to allow the soft tissue and the bone to find proper position.  I will immediately have her weightbearing as tolerated in surgical shoe to allow the bone to find his normal position and soft tissue to heal.  I discussed this with patient in extensive detail.  Patient states understanding I discussed with her that there is a risk of nonunion associated with it however over time the bone tends to find the ideal position and healing therefore take the pressure away from the plantar fascia.  Patient agrees with the plan.  There is also a chance of transmitting lesion as well.  My surgical plan was discussed in extensive detail as well as my postop protocol.  I plan on doing a right fifth metatarsal floating osteotomy/reverse Wilson without fixation.  Surgical shoe was dispensed -Informed surgical risk consent was reviewed and read aloud to the patient.  I reviewed the films.  I have discussed my findings with the patient in great  detail.  I have discussed all risks including but not limited to infection, stiffness, scarring, limp, disability, deformity, damage to blood vessels and nerves, numbness, poor healing, need for braces, arthritis, chronic pain, amputation, death.  All benefits and realistic expectations discussed in great detail.  I have made no promises as to the outcome.  I have provided realistic expectations.  I have offered the patient a 2nd opinion, which they have declined and assured me they preferred to proceed despite the risks -A total of 46 minutes was spent in direct patient care as well as pre and post patient encounter activities.  This includes documentation as well as reviewing patient chart for labs, imaging, past medical, surgical, social, and family history as documented in the EMR.  I have reviewed medication allergies as documented in EMR.  I discussed the etiology of condition and treatment options from conservative to surgical care.  All risks and benefit of the treatment course was discussed in detail.  All questions were answered and return appointment was discussed.  Since the visit completed in an ambulatory/outpatient setting, the patient and/or parent/guardian has been advised to contact the providers office for worsening condition and seek medical treatment and/or call 911 if the patient deems either is necessary.   No follow-ups on file.

## 2020-02-11 DIAGNOSIS — K220 Achalasia of cardia: Secondary | ICD-10-CM | POA: Diagnosis not present

## 2020-02-17 ENCOUNTER — Telehealth: Payer: Self-pay

## 2020-02-17 NOTE — Telephone Encounter (Signed)
DOS 03/01/2020  METATARSAL OSTEOTOMY 5TH LT - 28308  [employer]  Procedure [phone] Incision to straighten toe bone Diagnosis M20.11 Hallux valgus (acquired), right foot (primary) M21.541 Acquired clubfoot, right foot Confirmation date Mar 01, 2020 - Mar 31, 2020 [facility] authorization number [id number]

## 2020-02-19 ENCOUNTER — Telehealth: Payer: Self-pay

## 2020-02-19 NOTE — Telephone Encounter (Signed)
Andrea Leblanc called to cancel her surgery with Dr. Posey Pronto on 03/01/2020. She stated she has to have pulmonary surgery in February and can't afford to pay for 2 surgeries back to back. I told her I would cancel the surgery and for her to call us when she is ready to reschedule. Notified Cynthia with [facility] and Dr. Posey Pronto.

## 2020-03-08 ENCOUNTER — Encounter (HOSPITAL_COMMUNITY): Admission: RE | Admit: 2020-03-08 | Payer: Medicare HMO | Source: Ambulatory Visit

## 2020-03-10 ENCOUNTER — Encounter: Payer: Medicare HMO | Admitting: Podiatry

## 2020-03-12 ENCOUNTER — Encounter (HOSPITAL_COMMUNITY): Admission: RE | Admit: 2020-03-12 | Payer: Medicare HMO | Source: Ambulatory Visit

## 2020-03-12 ENCOUNTER — Encounter (HOSPITAL_COMMUNITY): Payer: Medicare HMO

## 2020-03-18 ENCOUNTER — Encounter (HOSPITAL_COMMUNITY): Admission: RE | Admit: 2020-03-18 | Payer: Medicare HMO | Source: Ambulatory Visit

## 2020-03-18 ENCOUNTER — Other Ambulatory Visit: Payer: Self-pay

## 2020-03-18 ENCOUNTER — Encounter (HOSPITAL_COMMUNITY)
Admission: RE | Admit: 2020-03-18 | Discharge: 2020-03-18 | Disposition: A | Discharge status: Home / Self Care | Payer: Medicare HMO | Source: Ambulatory Visit | Attending: Internal Medicine | Admitting: Internal Medicine

## 2020-03-18 ENCOUNTER — Encounter (HOSPITAL_COMMUNITY): Payer: Self-pay

## 2020-03-18 DIAGNOSIS — D631 Anemia in chronic kidney disease: Secondary | ICD-10-CM | POA: Insufficient documentation

## 2020-03-18 DIAGNOSIS — N183 Chronic kidney disease, stage 3 unspecified: Secondary | ICD-10-CM | POA: Insufficient documentation

## 2020-03-18 LAB — IRON AND TIBC
Iron: 73 ug/dL (ref 28–170)
Saturation Ratios: 26 % (ref 10.4–31.8)
TIBC: 278 ug/dL (ref 250–450)
UIBC: 205 ug/dL

## 2020-03-18 LAB — FERRITIN: Ferritin: 349 ng/mL — ABNORMAL HIGH (ref 11–307)

## 2020-03-18 LAB — POCT HEMOGLOBIN-HEMACUE: Hemoglobin: 12.5 g/dL (ref 12.0–15.0)

## 2020-03-18 MED ORDER — DARBEPOETIN ALFA 150 MCG/0.3ML IJ SOSY: 150.0000 ug | PREFILLED_SYRINGE | Freq: Once | INTRAMUSCULAR | Status: DC

## 2020-03-22 DIAGNOSIS — K219 Gastro-esophageal reflux disease without esophagitis: Secondary | ICD-10-CM | POA: Diagnosis not present

## 2020-03-22 DIAGNOSIS — I10 Essential (primary) hypertension: Secondary | ICD-10-CM | POA: Diagnosis not present

## 2020-03-23 ENCOUNTER — Other Ambulatory Visit (HOSPITAL_COMMUNITY): Payer: Self-pay | Admitting: Internal Medicine

## 2020-03-23 DIAGNOSIS — Z1231 Encounter for screening mammogram for malignant neoplasm of breast: Secondary | ICD-10-CM

## 2020-03-24 ENCOUNTER — Encounter: Payer: Medicare HMO | Admitting: Podiatry

## 2020-03-25 DIAGNOSIS — M329 Systemic lupus erythematosus, unspecified: Secondary | ICD-10-CM | POA: Diagnosis not present

## 2020-03-25 DIAGNOSIS — M797 Fibromyalgia: Secondary | ICD-10-CM | POA: Diagnosis not present

## 2020-03-25 DIAGNOSIS — Z7952 Long term (current) use of systemic steroids: Secondary | ICD-10-CM | POA: Diagnosis not present

## 2020-03-25 DIAGNOSIS — M069 Rheumatoid arthritis, unspecified: Secondary | ICD-10-CM | POA: Diagnosis not present

## 2020-03-25 DIAGNOSIS — K220 Achalasia of cardia: Secondary | ICD-10-CM | POA: Diagnosis not present

## 2020-03-25 DIAGNOSIS — E1122 Type 2 diabetes mellitus with diabetic chronic kidney disease: Secondary | ICD-10-CM | POA: Diagnosis not present

## 2020-03-25 DIAGNOSIS — I129 Hypertensive chronic kidney disease with stage 1 through stage 4 chronic kidney disease, or unspecified chronic kidney disease: Secondary | ICD-10-CM | POA: Diagnosis not present

## 2020-03-25 DIAGNOSIS — N1832 Chronic kidney disease, stage 3b: Secondary | ICD-10-CM | POA: Diagnosis not present

## 2020-03-29 ENCOUNTER — Other Ambulatory Visit: Payer: Self-pay

## 2020-03-29 NOTE — Patient Outreach (Signed)
[facility]) Care Management  03/29/2020  PROMYSE ARDITO 1950/08/21 [id number]   Referral Date: 03/29/20 Referral Source: [employer] Report Date of Discharge: 03/26/20 Facility:  [place]: [facility]   Referral received. No outreach warranted at this time. Transition of Care will be completed by primary care provider office who will refer to [facility] care management if needed.  Plan: RN CM will close case.  Jone Baseman, RN, MSN [place] Management Care Management Coordinator Direct Line [phone] Cell [phone] Toll Free: [phone]  Fax: [phone]

## 2020-04-16 DIAGNOSIS — Z6832 Body mass index (BMI) 32.0-32.9, adult: Secondary | ICD-10-CM | POA: Diagnosis not present

## 2020-04-16 DIAGNOSIS — J8410 Pulmonary fibrosis, unspecified: Secondary | ICD-10-CM | POA: Diagnosis not present

## 2020-04-16 DIAGNOSIS — I10 Essential (primary) hypertension: Secondary | ICD-10-CM | POA: Diagnosis not present

## 2020-04-16 DIAGNOSIS — Z299 Encounter for prophylactic measures, unspecified: Secondary | ICD-10-CM | POA: Diagnosis not present

## 2020-04-16 DIAGNOSIS — E1165 Type 2 diabetes mellitus with hyperglycemia: Secondary | ICD-10-CM | POA: Diagnosis not present

## 2020-04-16 DIAGNOSIS — M069 Rheumatoid arthritis, unspecified: Secondary | ICD-10-CM | POA: Diagnosis not present

## 2020-04-19 DIAGNOSIS — N183 Chronic kidney disease, stage 3 unspecified: Secondary | ICD-10-CM | POA: Diagnosis not present

## 2020-04-19 DIAGNOSIS — D631 Anemia in chronic kidney disease: Secondary | ICD-10-CM | POA: Diagnosis not present

## 2020-04-19 DIAGNOSIS — I129 Hypertensive chronic kidney disease with stage 1 through stage 4 chronic kidney disease, or unspecified chronic kidney disease: Secondary | ICD-10-CM | POA: Diagnosis not present

## 2020-04-19 DIAGNOSIS — M329 Systemic lupus erythematosus, unspecified: Secondary | ICD-10-CM | POA: Diagnosis not present

## 2020-04-19 DIAGNOSIS — J84112 Idiopathic pulmonary fibrosis: Secondary | ICD-10-CM | POA: Diagnosis not present

## 2020-04-30 DIAGNOSIS — M3214 Glomerular disease in systemic lupus erythematosus: Secondary | ICD-10-CM | POA: Diagnosis not present

## 2020-04-30 DIAGNOSIS — Z8719 Personal history of other diseases of the digestive system: Secondary | ICD-10-CM | POA: Diagnosis not present

## 2020-04-30 DIAGNOSIS — Z09 Encounter for follow-up examination after completed treatment for conditions other than malignant neoplasm: Secondary | ICD-10-CM | POA: Diagnosis not present

## 2020-04-30 DIAGNOSIS — M79671 Pain in right foot: Secondary | ICD-10-CM | POA: Diagnosis not present

## 2020-04-30 DIAGNOSIS — D539 Nutritional anemia, unspecified: Secondary | ICD-10-CM | POA: Diagnosis not present

## 2020-04-30 DIAGNOSIS — G8929 Other chronic pain: Secondary | ICD-10-CM | POA: Diagnosis not present

## 2020-04-30 DIAGNOSIS — Z13820 Encounter for screening for osteoporosis: Secondary | ICD-10-CM | POA: Diagnosis not present

## 2020-04-30 DIAGNOSIS — Z79899 Other long term (current) drug therapy: Secondary | ICD-10-CM | POA: Diagnosis not present

## 2020-04-30 DIAGNOSIS — J849 Interstitial pulmonary disease, unspecified: Secondary | ICD-10-CM | POA: Diagnosis not present

## 2020-04-30 DIAGNOSIS — R197 Diarrhea, unspecified: Secondary | ICD-10-CM | POA: Diagnosis not present

## 2020-04-30 DIAGNOSIS — M329 Systemic lupus erythematosus, unspecified: Secondary | ICD-10-CM | POA: Diagnosis not present

## 2020-04-30 DIAGNOSIS — Z7952 Long term (current) use of systemic steroids: Secondary | ICD-10-CM | POA: Diagnosis not present

## 2020-05-13 ENCOUNTER — Ambulatory Visit (HOSPITAL_COMMUNITY)
Admission: RE | Admit: 2020-05-13 | Discharge: 2020-05-13 | Disposition: A | Discharge status: Home / Self Care | Payer: Medicare HMO | Source: Ambulatory Visit | Attending: Internal Medicine | Admitting: Internal Medicine

## 2020-05-13 ENCOUNTER — Other Ambulatory Visit: Payer: Self-pay

## 2020-05-13 ENCOUNTER — Encounter (HOSPITAL_COMMUNITY)
Admission: RE | Admit: 2020-05-13 | Discharge: 2020-05-13 | Disposition: A | Discharge status: Home / Self Care | Payer: Medicare HMO | Source: Ambulatory Visit | Attending: Internal Medicine | Admitting: Internal Medicine

## 2020-05-13 ENCOUNTER — Encounter (HOSPITAL_COMMUNITY): Payer: Self-pay

## 2020-05-13 DIAGNOSIS — D631 Anemia in chronic kidney disease: Secondary | ICD-10-CM | POA: Insufficient documentation

## 2020-05-13 DIAGNOSIS — Z1231 Encounter for screening mammogram for malignant neoplasm of breast: Secondary | ICD-10-CM | POA: Insufficient documentation

## 2020-05-13 DIAGNOSIS — N183 Chronic kidney disease, stage 3 unspecified: Secondary | ICD-10-CM | POA: Insufficient documentation

## 2020-05-13 LAB — IRON AND TIBC
Iron: 30 ug/dL (ref 28–170)
Saturation Ratios: 13 % (ref 10.4–31.8)
TIBC: 230 ug/dL — ABNORMAL LOW (ref 250–450)
UIBC: 200 ug/dL

## 2020-05-13 LAB — POCT HEMOGLOBIN-HEMACUE: Hemoglobin: 11 g/dL — ABNORMAL LOW (ref 12.0–15.0)

## 2020-05-13 LAB — FERRITIN: Ferritin: 526 ng/mL — ABNORMAL HIGH (ref 11–307)

## 2020-05-13 MED ORDER — DARBEPOETIN ALFA 60 MCG/0.3ML IJ SOSY
60.0000 ug | PREFILLED_SYRINGE | Freq: Once | INTRAMUSCULAR | Status: AC
  Administered 2020-05-13: 60 ug via SUBCUTANEOUS
  Filled 2020-05-13: qty 0.3

## 2020-05-19 DIAGNOSIS — I10 Essential (primary) hypertension: Secondary | ICD-10-CM | POA: Diagnosis not present

## 2020-05-19 DIAGNOSIS — J449 Chronic obstructive pulmonary disease, unspecified: Secondary | ICD-10-CM | POA: Diagnosis not present

## 2020-06-14 DIAGNOSIS — M329 Systemic lupus erythematosus, unspecified: Secondary | ICD-10-CM | POA: Diagnosis not present

## 2020-06-14 DIAGNOSIS — Z79890 Hormone replacement therapy (postmenopausal): Secondary | ICD-10-CM | POA: Diagnosis not present

## 2020-07-08 ENCOUNTER — Other Ambulatory Visit: Payer: Self-pay

## 2020-07-08 ENCOUNTER — Encounter (HOSPITAL_COMMUNITY): Payer: Self-pay

## 2020-07-08 ENCOUNTER — Encounter (HOSPITAL_COMMUNITY)
Admission: RE | Admit: 2020-07-08 | Discharge: 2020-07-08 | Disposition: A | Discharge status: Home / Self Care | Payer: Medicare HMO | Source: Ambulatory Visit | Attending: Internal Medicine | Admitting: Internal Medicine

## 2020-07-08 DIAGNOSIS — D631 Anemia in chronic kidney disease: Secondary | ICD-10-CM | POA: Insufficient documentation

## 2020-07-08 DIAGNOSIS — N183 Chronic kidney disease, stage 3 unspecified: Secondary | ICD-10-CM | POA: Insufficient documentation

## 2020-07-08 LAB — IRON AND TIBC
Iron: 61 ug/dL (ref 28–170)
Saturation Ratios: 24 % (ref 10.4–31.8)
TIBC: 258 ug/dL (ref 250–450)
UIBC: 197 ug/dL

## 2020-07-08 LAB — POCT HEMOGLOBIN-HEMACUE: Hemoglobin: 10.5 g/dL — ABNORMAL LOW (ref 12.0–15.0)

## 2020-07-08 LAB — FERRITIN: Ferritin: 354 ng/mL — ABNORMAL HIGH (ref 11–307)

## 2020-07-08 MED ORDER — DARBEPOETIN ALFA 60 MCG/0.3ML IJ SOSY: 60.0000 ug | PREFILLED_SYRINGE | Freq: Once | INTRAMUSCULAR | Status: AC

## 2020-07-08 MED ORDER — DARBEPOETIN ALFA 60 MCG/0.3ML IJ SOSY
PREFILLED_SYRINGE | INTRAMUSCULAR | Status: AC
  Administered 2020-07-08: 60 ug
  Filled 2020-07-08: qty 0.3

## 2020-07-09 DIAGNOSIS — G894 Chronic pain syndrome: Secondary | ICD-10-CM | POA: Diagnosis not present

## 2020-07-09 DIAGNOSIS — G629 Polyneuropathy, unspecified: Secondary | ICD-10-CM | POA: Diagnosis not present

## 2020-07-09 DIAGNOSIS — E1140 Type 2 diabetes mellitus with diabetic neuropathy, unspecified: Secondary | ICD-10-CM | POA: Diagnosis not present

## 2020-07-17 DIAGNOSIS — M47814 Spondylosis without myelopathy or radiculopathy, thoracic region: Secondary | ICD-10-CM | POA: Diagnosis not present

## 2020-07-17 DIAGNOSIS — M4804 Spinal stenosis, thoracic region: Secondary | ICD-10-CM | POA: Diagnosis not present

## 2020-08-11 DIAGNOSIS — G8929 Other chronic pain: Secondary | ICD-10-CM | POA: Diagnosis not present

## 2020-08-11 DIAGNOSIS — R238 Other skin changes: Secondary | ICD-10-CM | POA: Diagnosis not present

## 2020-08-11 DIAGNOSIS — M791 Myalgia, unspecified site: Secondary | ICD-10-CM | POA: Diagnosis not present

## 2020-08-11 DIAGNOSIS — Z79899 Other long term (current) drug therapy: Secondary | ICD-10-CM | POA: Diagnosis not present

## 2020-08-11 DIAGNOSIS — M8588 Other specified disorders of bone density and structure, other site: Secondary | ICD-10-CM | POA: Diagnosis not present

## 2020-08-11 DIAGNOSIS — Z13820 Encounter for screening for osteoporosis: Secondary | ICD-10-CM | POA: Diagnosis not present

## 2020-08-11 DIAGNOSIS — M329 Systemic lupus erythematosus, unspecified: Secondary | ICD-10-CM | POA: Diagnosis not present

## 2020-08-11 DIAGNOSIS — M67471 Ganglion, right ankle and foot: Secondary | ICD-10-CM | POA: Diagnosis not present

## 2020-08-11 DIAGNOSIS — G629 Polyneuropathy, unspecified: Secondary | ICD-10-CM | POA: Diagnosis not present

## 2020-08-11 DIAGNOSIS — M79671 Pain in right foot: Secondary | ICD-10-CM | POA: Diagnosis not present

## 2020-08-11 DIAGNOSIS — M3214 Glomerular disease in systemic lupus erythematosus: Secondary | ICD-10-CM | POA: Diagnosis not present

## 2020-08-11 DIAGNOSIS — M19071 Primary osteoarthritis, right ankle and foot: Secondary | ICD-10-CM | POA: Diagnosis not present

## 2020-08-11 DIAGNOSIS — J849 Interstitial pulmonary disease, unspecified: Secondary | ICD-10-CM | POA: Diagnosis not present

## 2020-08-19 DIAGNOSIS — R739 Hyperglycemia, unspecified: Secondary | ICD-10-CM | POA: Diagnosis not present

## 2020-08-19 DIAGNOSIS — I10 Essential (primary) hypertension: Secondary | ICD-10-CM | POA: Diagnosis not present

## 2020-08-19 DIAGNOSIS — M109 Gout, unspecified: Secondary | ICD-10-CM | POA: Diagnosis not present

## 2020-09-02 ENCOUNTER — Encounter (HOSPITAL_COMMUNITY)
Admission: RE | Admit: 2020-09-02 | Discharge: 2020-09-02 | Disposition: A | Discharge status: Home / Self Care | Payer: Medicare Other | Source: Ambulatory Visit | Attending: Internal Medicine | Admitting: Internal Medicine

## 2020-09-02 ENCOUNTER — Other Ambulatory Visit: Payer: Self-pay

## 2020-09-02 ENCOUNTER — Encounter (HOSPITAL_COMMUNITY): Payer: Self-pay

## 2020-09-02 DIAGNOSIS — N183 Chronic kidney disease, stage 3 unspecified: Secondary | ICD-10-CM | POA: Diagnosis not present

## 2020-09-02 DIAGNOSIS — D631 Anemia in chronic kidney disease: Secondary | ICD-10-CM | POA: Insufficient documentation

## 2020-09-02 LAB — CBC
HCT: 38.3 % (ref 36.0–46.0)
Hemoglobin: 11.6 g/dL — ABNORMAL LOW (ref 12.0–15.0)
MCH: 31.6 pg (ref 26.0–34.0)
MCHC: 30.3 g/dL (ref 30.0–36.0)
MCV: 104.4 fL — ABNORMAL HIGH (ref 80.0–100.0)
Platelets: 195 10*3/uL (ref 150–400)
RBC: 3.67 MIL/uL — ABNORMAL LOW (ref 3.87–5.11)
RDW: 13.2 % (ref 11.5–15.5)
WBC: 5.1 10*3/uL (ref 4.0–10.5)
nRBC: 0 % (ref 0.0–0.2)

## 2020-09-02 LAB — HEPATIC FUNCTION PANEL
ALT: 19 U/L (ref 0–44)
AST: 22 U/L (ref 15–41)
Albumin: 3.2 g/dL — ABNORMAL LOW (ref 3.5–5.0)
Alkaline Phosphatase: 63 U/L (ref 38–126)
Bilirubin, Direct: <0.1 mg/dL (ref 0.0–0.2)
Total Bilirubin: 0.5 mg/dL (ref 0.3–1.2)
Total Protein: 7.4 g/dL (ref 6.5–8.1)

## 2020-09-02 LAB — FERRITIN: Ferritin: 421 ng/mL — ABNORMAL HIGH (ref 11–307)

## 2020-09-02 LAB — IRON AND TIBC
Iron: 56 ug/dL (ref 28–170)
Saturation Ratios: 22 % (ref 10.4–31.8)
TIBC: 259 ug/dL (ref 250–450)
UIBC: 203 ug/dL

## 2020-09-02 LAB — CREATININE, SERUM
Creatinine, Ser: 1.29 mg/dL — ABNORMAL HIGH (ref 0.44–1.00)
GFR, Estimated: 45 mL/min — ABNORMAL LOW (ref >60)

## 2020-09-02 LAB — POCT HEMOGLOBIN-HEMACUE: Hemoglobin: 11.6 g/dL — ABNORMAL LOW (ref 12.0–15.0)

## 2020-09-02 MED ORDER — DARBEPOETIN ALFA 100 MCG/0.5ML IJ SOSY
60.0000 ug | PREFILLED_SYRINGE | Freq: Once | INTRAMUSCULAR | Status: AC
  Administered 2020-09-02: 60 ug via SUBCUTANEOUS
  Filled 2020-09-02: qty 0.5

## 2020-10-08 DIAGNOSIS — E1140 Type 2 diabetes mellitus with diabetic neuropathy, unspecified: Secondary | ICD-10-CM | POA: Diagnosis not present

## 2020-10-18 DIAGNOSIS — I10 Essential (primary) hypertension: Secondary | ICD-10-CM | POA: Diagnosis not present

## 2020-10-18 DIAGNOSIS — Z79899 Other long term (current) drug therapy: Secondary | ICD-10-CM | POA: Diagnosis not present

## 2020-10-18 DIAGNOSIS — L930 Discoid lupus erythematosus: Secondary | ICD-10-CM | POA: Diagnosis not present

## 2020-10-18 DIAGNOSIS — R5383 Other fatigue: Secondary | ICD-10-CM | POA: Diagnosis not present

## 2020-10-18 DIAGNOSIS — Z0000 Encounter for general adult medical examination without abnormal findings: Secondary | ICD-10-CM | POA: Diagnosis not present

## 2020-10-18 DIAGNOSIS — E1165 Type 2 diabetes mellitus with hyperglycemia: Secondary | ICD-10-CM | POA: Diagnosis not present

## 2020-10-18 DIAGNOSIS — Z299 Encounter for prophylactic measures, unspecified: Secondary | ICD-10-CM | POA: Diagnosis not present

## 2020-10-18 DIAGNOSIS — M069 Rheumatoid arthritis, unspecified: Secondary | ICD-10-CM | POA: Diagnosis not present

## 2020-10-20 DIAGNOSIS — M329 Systemic lupus erythematosus, unspecified: Secondary | ICD-10-CM | POA: Diagnosis not present

## 2020-10-20 DIAGNOSIS — I129 Hypertensive chronic kidney disease with stage 1 through stage 4 chronic kidney disease, or unspecified chronic kidney disease: Secondary | ICD-10-CM | POA: Diagnosis not present

## 2020-10-20 DIAGNOSIS — N183 Chronic kidney disease, stage 3 unspecified: Secondary | ICD-10-CM | POA: Diagnosis not present

## 2020-10-20 DIAGNOSIS — J84112 Idiopathic pulmonary fibrosis: Secondary | ICD-10-CM | POA: Diagnosis not present

## 2020-10-20 DIAGNOSIS — D631 Anemia in chronic kidney disease: Secondary | ICD-10-CM | POA: Diagnosis not present

## 2020-10-28 ENCOUNTER — Encounter (HOSPITAL_COMMUNITY)
Admission: RE | Admit: 2020-10-28 | Discharge: 2020-10-28 | Disposition: A | Discharge status: Home / Self Care | Payer: Medicare Other | Source: Ambulatory Visit | Attending: Internal Medicine | Admitting: Internal Medicine

## 2020-10-28 ENCOUNTER — Other Ambulatory Visit: Payer: Self-pay

## 2020-10-28 DIAGNOSIS — N183 Chronic kidney disease, stage 3 unspecified: Secondary | ICD-10-CM | POA: Insufficient documentation

## 2020-10-28 DIAGNOSIS — D631 Anemia in chronic kidney disease: Secondary | ICD-10-CM | POA: Insufficient documentation

## 2020-10-28 LAB — IRON AND TIBC
Iron: 54 ug/dL (ref 28–170)
Saturation Ratios: 22 % (ref 10.4–31.8)
TIBC: 245 ug/dL — ABNORMAL LOW (ref 250–450)
UIBC: 191 ug/dL

## 2020-10-28 LAB — FERRITIN: Ferritin: 357 ng/mL — ABNORMAL HIGH (ref 11–307)

## 2020-10-28 LAB — POCT HEMOGLOBIN-HEMACUE: Hemoglobin: 11.3 g/dL — ABNORMAL LOW (ref 12.0–15.0)

## 2020-10-28 MED ORDER — DARBEPOETIN ALFA 60 MCG/0.3ML IJ SOSY
PREFILLED_SYRINGE | INTRAMUSCULAR | Status: AC
  Filled 2020-10-28: qty 0.3

## 2020-10-28 MED ORDER — DARBEPOETIN ALFA 60 MCG/0.3ML IJ SOSY
60.0000 ug | PREFILLED_SYRINGE | Freq: Once | INTRAMUSCULAR | Status: AC
  Administered 2020-10-28: 60 ug via SUBCUTANEOUS

## 2020-11-19 DIAGNOSIS — E1140 Type 2 diabetes mellitus with diabetic neuropathy, unspecified: Secondary | ICD-10-CM | POA: Diagnosis not present

## 2020-11-19 DIAGNOSIS — G629 Polyneuropathy, unspecified: Secondary | ICD-10-CM | POA: Diagnosis not present

## 2020-11-19 DIAGNOSIS — Z01812 Encounter for preprocedural laboratory examination: Secondary | ICD-10-CM | POA: Diagnosis not present

## 2020-12-03 DIAGNOSIS — M069 Rheumatoid arthritis, unspecified: Secondary | ICD-10-CM | POA: Diagnosis not present

## 2020-12-03 DIAGNOSIS — E1141 Type 2 diabetes mellitus with diabetic mononeuropathy: Secondary | ICD-10-CM | POA: Diagnosis not present

## 2020-12-03 DIAGNOSIS — M329 Systemic lupus erythematosus, unspecified: Secondary | ICD-10-CM | POA: Diagnosis not present

## 2020-12-03 DIAGNOSIS — G5783 Other specified mononeuropathies of bilateral lower limbs: Secondary | ICD-10-CM | POA: Diagnosis not present

## 2020-12-03 DIAGNOSIS — I10 Essential (primary) hypertension: Secondary | ICD-10-CM | POA: Diagnosis not present

## 2020-12-03 DIAGNOSIS — Z79899 Other long term (current) drug therapy: Secondary | ICD-10-CM | POA: Diagnosis not present

## 2020-12-03 DIAGNOSIS — E1140 Type 2 diabetes mellitus with diabetic neuropathy, unspecified: Secondary | ICD-10-CM | POA: Diagnosis not present

## 2020-12-03 DIAGNOSIS — J45909 Unspecified asthma, uncomplicated: Secondary | ICD-10-CM | POA: Diagnosis not present

## 2020-12-03 DIAGNOSIS — Z87891 Personal history of nicotine dependence: Secondary | ICD-10-CM | POA: Diagnosis not present

## 2020-12-03 DIAGNOSIS — D649 Anemia, unspecified: Secondary | ICD-10-CM | POA: Diagnosis not present

## 2020-12-14 DIAGNOSIS — Z9689 Presence of other specified functional implants: Secondary | ICD-10-CM | POA: Diagnosis not present

## 2020-12-23 ENCOUNTER — Encounter (HOSPITAL_COMMUNITY)
Admission: RE | Admit: 2020-12-23 | Discharge: 2020-12-23 | Disposition: A | Discharge status: Home / Self Care | Payer: Medicare Other | Source: Ambulatory Visit | Attending: Internal Medicine | Admitting: Internal Medicine

## 2020-12-23 ENCOUNTER — Other Ambulatory Visit: Payer: Self-pay

## 2020-12-23 DIAGNOSIS — N183 Chronic kidney disease, stage 3 unspecified: Secondary | ICD-10-CM | POA: Diagnosis not present

## 2020-12-23 DIAGNOSIS — D631 Anemia in chronic kidney disease: Secondary | ICD-10-CM | POA: Diagnosis not present

## 2020-12-23 LAB — IRON AND TIBC
Iron: 46 ug/dL (ref 28–170)
Saturation Ratios: 19 % (ref 10.4–31.8)
TIBC: 248 ug/dL — ABNORMAL LOW (ref 250–450)
UIBC: 202 ug/dL

## 2020-12-23 LAB — POCT HEMOGLOBIN-HEMACUE: Hemoglobin: 10.9 g/dL — ABNORMAL LOW (ref 12.0–15.0)

## 2020-12-23 LAB — FERRITIN: Ferritin: 384 ng/mL — ABNORMAL HIGH (ref 11–307)

## 2020-12-23 MED ORDER — DARBEPOETIN ALFA 60 MCG/0.3ML IJ SOSY
PREFILLED_SYRINGE | INTRAMUSCULAR | Status: AC
  Filled 2020-12-23: qty 0.3

## 2020-12-23 MED ORDER — DARBEPOETIN ALFA 100 MCG/0.5ML IJ SOSY
60.0000 ug | PREFILLED_SYRINGE | Freq: Once | INTRAMUSCULAR | Status: AC
  Administered 2020-12-23: 60 ug via SUBCUTANEOUS

## 2020-12-31 DIAGNOSIS — E1140 Type 2 diabetes mellitus with diabetic neuropathy, unspecified: Secondary | ICD-10-CM | POA: Diagnosis not present

## 2020-12-31 DIAGNOSIS — G894 Chronic pain syndrome: Secondary | ICD-10-CM | POA: Diagnosis not present

## 2021-02-01 DIAGNOSIS — Z299 Encounter for prophylactic measures, unspecified: Secondary | ICD-10-CM | POA: Diagnosis not present

## 2021-02-01 DIAGNOSIS — I10 Essential (primary) hypertension: Secondary | ICD-10-CM | POA: Diagnosis not present

## 2021-02-01 DIAGNOSIS — Z789 Other specified health status: Secondary | ICD-10-CM | POA: Diagnosis not present

## 2021-02-01 DIAGNOSIS — E1165 Type 2 diabetes mellitus with hyperglycemia: Secondary | ICD-10-CM | POA: Diagnosis not present

## 2021-02-01 DIAGNOSIS — G629 Polyneuropathy, unspecified: Secondary | ICD-10-CM | POA: Diagnosis not present

## 2021-02-15 DIAGNOSIS — D472 Monoclonal gammopathy: Secondary | ICD-10-CM | POA: Diagnosis not present

## 2021-02-15 DIAGNOSIS — M3214 Glomerular disease in systemic lupus erythematosus: Secondary | ICD-10-CM | POA: Diagnosis not present

## 2021-02-15 DIAGNOSIS — R233 Spontaneous ecchymoses: Secondary | ICD-10-CM | POA: Diagnosis not present

## 2021-02-15 DIAGNOSIS — R700 Elevated erythrocyte sedimentation rate: Secondary | ICD-10-CM | POA: Diagnosis not present

## 2021-02-15 DIAGNOSIS — G629 Polyneuropathy, unspecified: Secondary | ICD-10-CM | POA: Diagnosis not present

## 2021-02-15 DIAGNOSIS — Z79899 Other long term (current) drug therapy: Secondary | ICD-10-CM | POA: Diagnosis not present

## 2021-02-15 DIAGNOSIS — J849 Interstitial pulmonary disease, unspecified: Secondary | ICD-10-CM | POA: Diagnosis not present

## 2021-02-17 ENCOUNTER — Other Ambulatory Visit: Payer: Self-pay

## 2021-02-17 ENCOUNTER — Encounter (HOSPITAL_COMMUNITY): Payer: Self-pay

## 2021-02-17 ENCOUNTER — Encounter (HOSPITAL_COMMUNITY)
Admission: RE | Admit: 2021-02-17 | Discharge: 2021-02-17 | Disposition: A | Discharge status: Home / Self Care | Payer: Medicare Other | Source: Ambulatory Visit | Attending: Internal Medicine | Admitting: Internal Medicine

## 2021-02-17 DIAGNOSIS — D631 Anemia in chronic kidney disease: Secondary | ICD-10-CM | POA: Diagnosis not present

## 2021-02-17 DIAGNOSIS — N183 Chronic kidney disease, stage 3 unspecified: Secondary | ICD-10-CM | POA: Insufficient documentation

## 2021-02-17 LAB — FERRITIN: Ferritin: 315 ng/mL — ABNORMAL HIGH (ref 11–307)

## 2021-02-17 LAB — IRON AND TIBC
Iron: 54 ug/dL (ref 28–170)
Saturation Ratios: 22 % (ref 10.4–31.8)
TIBC: 250 ug/dL (ref 250–450)
UIBC: 196 ug/dL

## 2021-02-17 LAB — POCT HEMOGLOBIN-HEMACUE: Hemoglobin: 9.7 g/dL — ABNORMAL LOW (ref 12.0–15.0)

## 2021-02-17 MED ORDER — DARBEPOETIN ALFA 60 MCG/0.3ML IJ SOSY
60.0000 ug | PREFILLED_SYRINGE | Freq: Once | INTRAMUSCULAR | Status: AC
  Administered 2021-02-17: 14:00:00 60 ug via SUBCUTANEOUS

## 2021-02-17 MED ORDER — DARBEPOETIN ALFA 60 MCG/0.3ML IJ SOSY
PREFILLED_SYRINGE | INTRAMUSCULAR | Status: AC
  Filled 2021-02-17: qty 0.3

## 2021-02-24 ENCOUNTER — Encounter (HOSPITAL_COMMUNITY): Payer: Self-pay

## 2021-02-24 ENCOUNTER — Encounter (HOSPITAL_COMMUNITY)
Admission: RE | Admit: 2021-02-24 | Discharge: 2021-02-24 | Disposition: A | Discharge status: Home / Self Care | Payer: Medicare HMO | Source: Ambulatory Visit | Attending: Internal Medicine | Admitting: Internal Medicine

## 2021-02-24 DIAGNOSIS — D631 Anemia in chronic kidney disease: Secondary | ICD-10-CM | POA: Diagnosis not present

## 2021-02-24 DIAGNOSIS — N183 Chronic kidney disease, stage 3 unspecified: Secondary | ICD-10-CM | POA: Diagnosis not present

## 2021-02-24 MED ORDER — SODIUM CHLORIDE 0.9 % IV SOLN: Freq: Once | INTRAVENOUS | Status: AC

## 2021-02-24 MED ORDER — SODIUM CHLORIDE 0.9 % IV SOLN
510.0000 mg | Freq: Once | INTRAVENOUS | Status: AC
  Administered 2021-02-24: 510 mg via INTRAVENOUS
  Filled 2021-02-24: qty 17

## 2021-03-17 ENCOUNTER — Encounter (HOSPITAL_COMMUNITY): Payer: Self-pay

## 2021-03-17 ENCOUNTER — Other Ambulatory Visit: Payer: Self-pay

## 2021-03-17 ENCOUNTER — Encounter (HOSPITAL_COMMUNITY)
Admission: RE | Admit: 2021-03-17 | Discharge: 2021-03-17 | Disposition: A | Discharge status: Home / Self Care | Payer: Medicare HMO | Source: Ambulatory Visit | Attending: Internal Medicine | Admitting: Internal Medicine

## 2021-03-17 DIAGNOSIS — N183 Chronic kidney disease, stage 3 unspecified: Secondary | ICD-10-CM | POA: Diagnosis not present

## 2021-03-17 DIAGNOSIS — D631 Anemia in chronic kidney disease: Secondary | ICD-10-CM | POA: Diagnosis not present

## 2021-03-17 LAB — IRON AND TIBC
Iron: 65 ug/dL (ref 28–170)
Saturation Ratios: 25 % (ref 10.4–31.8)
TIBC: 264 ug/dL (ref 250–450)
UIBC: 199 ug/dL

## 2021-03-17 LAB — POCT HEMOGLOBIN-HEMACUE: Hemoglobin: 12.1 g/dL (ref 12.0–15.0)

## 2021-03-17 LAB — FERRITIN: Ferritin: 557 ng/mL — ABNORMAL HIGH (ref 11–307)

## 2021-03-17 MED ORDER — DARBEPOETIN ALFA 60 MCG/0.3ML IJ SOSY: 60.0000 ug | PREFILLED_SYRINGE | Freq: Once | INTRAMUSCULAR | Status: DC

## 2021-03-22 DIAGNOSIS — I10 Essential (primary) hypertension: Secondary | ICD-10-CM | POA: Diagnosis not present

## 2021-03-22 DIAGNOSIS — Z79899 Other long term (current) drug therapy: Secondary | ICD-10-CM | POA: Diagnosis not present

## 2021-03-22 DIAGNOSIS — E782 Mixed hyperlipidemia: Secondary | ICD-10-CM | POA: Diagnosis not present

## 2021-03-31 DIAGNOSIS — M3214 Glomerular disease in systemic lupus erythematosus: Secondary | ICD-10-CM | POA: Diagnosis not present

## 2021-03-31 DIAGNOSIS — M797 Fibromyalgia: Secondary | ICD-10-CM | POA: Diagnosis not present

## 2021-03-31 DIAGNOSIS — G629 Polyneuropathy, unspecified: Secondary | ICD-10-CM | POA: Diagnosis not present

## 2021-03-31 DIAGNOSIS — M329 Systemic lupus erythematosus, unspecified: Secondary | ICD-10-CM | POA: Diagnosis not present

## 2021-03-31 DIAGNOSIS — Z7952 Long term (current) use of systemic steroids: Secondary | ICD-10-CM | POA: Diagnosis not present

## 2021-03-31 DIAGNOSIS — D509 Iron deficiency anemia, unspecified: Secondary | ICD-10-CM | POA: Diagnosis not present

## 2021-03-31 DIAGNOSIS — Z79899 Other long term (current) drug therapy: Secondary | ICD-10-CM | POA: Diagnosis not present

## 2021-03-31 DIAGNOSIS — J849 Interstitial pulmonary disease, unspecified: Secondary | ICD-10-CM | POA: Diagnosis not present

## 2021-03-31 DIAGNOSIS — N189 Chronic kidney disease, unspecified: Secondary | ICD-10-CM | POA: Diagnosis not present

## 2021-03-31 DIAGNOSIS — Z7961 Long term (current) use of immunomodulator: Secondary | ICD-10-CM | POA: Diagnosis not present

## 2021-04-14 ENCOUNTER — Encounter (HOSPITAL_COMMUNITY)
Admission: RE | Admit: 2021-04-14 | Discharge: 2021-04-14 | Disposition: A | Discharge status: Home / Self Care | Payer: Medicare HMO | Source: Ambulatory Visit | Attending: Internal Medicine | Admitting: Internal Medicine

## 2021-04-14 ENCOUNTER — Encounter (HOSPITAL_COMMUNITY): Payer: Medicare HMO

## 2021-04-14 ENCOUNTER — Encounter (HOSPITAL_COMMUNITY): Payer: Medicare Other

## 2021-04-14 DIAGNOSIS — M3214 Glomerular disease in systemic lupus erythematosus: Secondary | ICD-10-CM | POA: Diagnosis not present

## 2021-04-14 DIAGNOSIS — G629 Polyneuropathy, unspecified: Secondary | ICD-10-CM | POA: Diagnosis not present

## 2021-04-14 DIAGNOSIS — J479 Bronchiectasis, uncomplicated: Secondary | ICD-10-CM | POA: Diagnosis not present

## 2021-04-14 DIAGNOSIS — J84112 Idiopathic pulmonary fibrosis: Secondary | ICD-10-CM | POA: Diagnosis not present

## 2021-04-14 DIAGNOSIS — D631 Anemia in chronic kidney disease: Secondary | ICD-10-CM | POA: Insufficient documentation

## 2021-04-14 DIAGNOSIS — N183 Chronic kidney disease, stage 3 unspecified: Secondary | ICD-10-CM | POA: Insufficient documentation

## 2021-04-14 DIAGNOSIS — I2510 Atherosclerotic heart disease of native coronary artery without angina pectoris: Secondary | ICD-10-CM | POA: Diagnosis not present

## 2021-04-14 DIAGNOSIS — J849 Interstitial pulmonary disease, unspecified: Secondary | ICD-10-CM | POA: Diagnosis not present

## 2021-04-14 DIAGNOSIS — Z7961 Long term (current) use of immunomodulator: Secondary | ICD-10-CM | POA: Diagnosis not present

## 2021-04-14 LAB — IRON AND TIBC
Iron: 60 ug/dL (ref 28–170)
Saturation Ratios: 24 % (ref 10.4–31.8)
TIBC: 249 ug/dL — ABNORMAL LOW (ref 250–450)
UIBC: 189 ug/dL

## 2021-04-14 LAB — FERRITIN: Ferritin: 548 ng/mL — ABNORMAL HIGH (ref 11–307)

## 2021-04-14 LAB — POCT HEMOGLOBIN-HEMACUE: Hemoglobin: 10.6 g/dL — ABNORMAL LOW (ref 12.0–15.0)

## 2021-04-14 MED ORDER — DARBEPOETIN ALFA 100 MCG/0.5ML IJ SOSY
50.0000 ug | PREFILLED_SYRINGE | Freq: Once | INTRAMUSCULAR | Status: AC
  Administered 2021-04-14: 50 ug via SUBCUTANEOUS

## 2021-04-14 MED ORDER — DARBEPOETIN ALFA 60 MCG/0.3ML IJ SOSY
PREFILLED_SYRINGE | INTRAMUSCULAR | Status: AC
  Filled 2021-04-14: qty 0.3

## 2021-04-20 DIAGNOSIS — N183 Chronic kidney disease, stage 3 unspecified: Secondary | ICD-10-CM | POA: Diagnosis not present

## 2021-04-20 DIAGNOSIS — M329 Systemic lupus erythematosus, unspecified: Secondary | ICD-10-CM | POA: Diagnosis not present

## 2021-04-20 DIAGNOSIS — I129 Hypertensive chronic kidney disease with stage 1 through stage 4 chronic kidney disease, or unspecified chronic kidney disease: Secondary | ICD-10-CM | POA: Diagnosis not present

## 2021-04-20 DIAGNOSIS — D631 Anemia in chronic kidney disease: Secondary | ICD-10-CM | POA: Diagnosis not present

## 2021-04-20 DIAGNOSIS — J84112 Idiopathic pulmonary fibrosis: Secondary | ICD-10-CM | POA: Diagnosis not present

## 2021-05-10 DIAGNOSIS — N189 Chronic kidney disease, unspecified: Secondary | ICD-10-CM | POA: Diagnosis not present

## 2021-05-12 ENCOUNTER — Encounter (HOSPITAL_COMMUNITY)
Admission: RE | Admit: 2021-05-12 | Discharge: 2021-05-12 | Disposition: A | Discharge status: Home / Self Care | Payer: Medicare HMO | Source: Ambulatory Visit | Attending: Internal Medicine | Admitting: Internal Medicine

## 2021-05-12 ENCOUNTER — Encounter (HOSPITAL_COMMUNITY): Payer: Self-pay

## 2021-05-12 DIAGNOSIS — D631 Anemia in chronic kidney disease: Secondary | ICD-10-CM | POA: Insufficient documentation

## 2021-05-12 DIAGNOSIS — M069 Rheumatoid arthritis, unspecified: Secondary | ICD-10-CM | POA: Diagnosis not present

## 2021-05-12 DIAGNOSIS — L259 Unspecified contact dermatitis, unspecified cause: Secondary | ICD-10-CM | POA: Diagnosis not present

## 2021-05-12 DIAGNOSIS — N183 Chronic kidney disease, stage 3 unspecified: Secondary | ICD-10-CM | POA: Diagnosis not present

## 2021-05-12 DIAGNOSIS — E1165 Type 2 diabetes mellitus with hyperglycemia: Secondary | ICD-10-CM | POA: Diagnosis not present

## 2021-05-12 DIAGNOSIS — Z6831 Body mass index (BMI) 31.0-31.9, adult: Secondary | ICD-10-CM | POA: Diagnosis not present

## 2021-05-12 DIAGNOSIS — J8410 Pulmonary fibrosis, unspecified: Secondary | ICD-10-CM | POA: Diagnosis not present

## 2021-05-12 DIAGNOSIS — E1122 Type 2 diabetes mellitus with diabetic chronic kidney disease: Secondary | ICD-10-CM | POA: Diagnosis not present

## 2021-05-12 DIAGNOSIS — Z299 Encounter for prophylactic measures, unspecified: Secondary | ICD-10-CM | POA: Diagnosis not present

## 2021-05-12 DIAGNOSIS — I10 Essential (primary) hypertension: Secondary | ICD-10-CM | POA: Diagnosis not present

## 2021-05-12 LAB — POCT HEMOGLOBIN-HEMACUE: Hemoglobin: 9.9 g/dL — ABNORMAL LOW (ref 12.0–15.0)

## 2021-05-12 MED ORDER — DARBEPOETIN ALFA 60 MCG/0.3ML IJ SOSY
50.0000 ug | PREFILLED_SYRINGE | Freq: Once | INTRAMUSCULAR | Status: AC
  Administered 2021-05-12: 50 ug via SUBCUTANEOUS
  Filled 2021-05-12: qty 0.3

## 2021-05-19 ENCOUNTER — Other Ambulatory Visit (HOSPITAL_COMMUNITY): Payer: Self-pay | Admitting: Family Medicine

## 2021-05-19 DIAGNOSIS — Z1231 Encounter for screening mammogram for malignant neoplasm of breast: Secondary | ICD-10-CM

## 2021-06-09 ENCOUNTER — Encounter (HOSPITAL_COMMUNITY): Payer: Self-pay

## 2021-06-09 ENCOUNTER — Encounter (HOSPITAL_COMMUNITY)
Admission: RE | Admit: 2021-06-09 | Discharge: 2021-06-09 | Disposition: A | Discharge status: Home / Self Care | Payer: Medicare HMO | Source: Ambulatory Visit | Attending: Internal Medicine | Admitting: Internal Medicine

## 2021-06-09 ENCOUNTER — Ambulatory Visit (HOSPITAL_COMMUNITY)
Admission: RE | Admit: 2021-06-09 | Discharge: 2021-06-09 | Disposition: A | Discharge status: Home / Self Care | Payer: Medicare HMO | Source: Ambulatory Visit | Attending: Family Medicine | Admitting: Family Medicine

## 2021-06-09 DIAGNOSIS — N183 Chronic kidney disease, stage 3 unspecified: Secondary | ICD-10-CM | POA: Insufficient documentation

## 2021-06-09 DIAGNOSIS — D631 Anemia in chronic kidney disease: Secondary | ICD-10-CM | POA: Insufficient documentation

## 2021-06-09 DIAGNOSIS — Z1231 Encounter for screening mammogram for malignant neoplasm of breast: Secondary | ICD-10-CM | POA: Diagnosis not present

## 2021-06-09 LAB — IRON AND TIBC
Iron: 57 ug/dL (ref 28–170)
Saturation Ratios: 23 % (ref 10.4–31.8)
TIBC: 250 ug/dL (ref 250–450)
UIBC: 193 ug/dL

## 2021-06-09 LAB — FERRITIN: Ferritin: 486 ng/mL — ABNORMAL HIGH (ref 11–307)

## 2021-06-09 LAB — POCT HEMOGLOBIN-HEMACUE: Hemoglobin: 11.6 g/dL — ABNORMAL LOW (ref 12.0–15.0)

## 2021-06-09 MED ORDER — DARBEPOETIN ALFA 100 MCG/0.5ML IJ SOSY
80.0000 ug | PREFILLED_SYRINGE | INTRAMUSCULAR | Status: DC
  Administered 2021-06-09: 50 ug via SUBCUTANEOUS

## 2021-06-09 MED ORDER — DARBEPOETIN ALFA 100 MCG/0.5ML IJ SOSY
PREFILLED_SYRINGE | INTRAMUSCULAR | Status: AC
  Filled 2021-06-09: qty 0.5

## 2021-07-07 ENCOUNTER — Encounter (HOSPITAL_COMMUNITY)
Admission: RE | Admit: 2021-07-07 | Discharge: 2021-07-07 | Disposition: A | Discharge status: Home / Self Care | Payer: Medicare HMO | Source: Ambulatory Visit | Attending: Internal Medicine | Admitting: Internal Medicine

## 2021-07-07 ENCOUNTER — Ambulatory Visit (HOSPITAL_COMMUNITY): Payer: Medicare HMO

## 2021-07-07 DIAGNOSIS — N184 Chronic kidney disease, stage 4 (severe): Secondary | ICD-10-CM

## 2021-07-07 DIAGNOSIS — Z713 Dietary counseling and surveillance: Secondary | ICD-10-CM | POA: Diagnosis not present

## 2021-07-07 DIAGNOSIS — N183 Chronic kidney disease, stage 3 unspecified: Secondary | ICD-10-CM | POA: Insufficient documentation

## 2021-07-07 DIAGNOSIS — I10 Essential (primary) hypertension: Secondary | ICD-10-CM | POA: Diagnosis not present

## 2021-07-07 DIAGNOSIS — Z182 Retained plastic fragments: Secondary | ICD-10-CM | POA: Diagnosis not present

## 2021-07-07 DIAGNOSIS — E1122 Type 2 diabetes mellitus with diabetic chronic kidney disease: Secondary | ICD-10-CM | POA: Diagnosis not present

## 2021-07-07 DIAGNOSIS — Z6830 Body mass index (BMI) 30.0-30.9, adult: Secondary | ICD-10-CM | POA: Diagnosis not present

## 2021-07-07 DIAGNOSIS — Z79899 Other long term (current) drug therapy: Secondary | ICD-10-CM | POA: Diagnosis not present

## 2021-07-07 DIAGNOSIS — M329 Systemic lupus erythematosus, unspecified: Secondary | ICD-10-CM | POA: Diagnosis not present

## 2021-07-07 DIAGNOSIS — D631 Anemia in chronic kidney disease: Secondary | ICD-10-CM | POA: Diagnosis not present

## 2021-07-07 DIAGNOSIS — Z299 Encounter for prophylactic measures, unspecified: Secondary | ICD-10-CM | POA: Diagnosis not present

## 2021-07-07 DIAGNOSIS — M069 Rheumatoid arthritis, unspecified: Secondary | ICD-10-CM | POA: Diagnosis not present

## 2021-07-07 LAB — IRON AND TIBC
Iron: 52 ug/dL (ref 28–170)
Saturation Ratios: 21 % (ref 10.4–31.8)
TIBC: 242 ug/dL — ABNORMAL LOW (ref 250–450)
UIBC: 190 ug/dL

## 2021-07-07 LAB — FERRITIN: Ferritin: 518 ng/mL — ABNORMAL HIGH (ref 11–307)

## 2021-07-07 LAB — POCT HEMOGLOBIN-HEMACUE: Hemoglobin: 11.8 g/dL — ABNORMAL LOW (ref 12.0–15.0)

## 2021-07-07 MED ORDER — DARBEPOETIN ALFA 100 MCG/0.5ML IJ SOSY
50.0000 ug | PREFILLED_SYRINGE | Freq: Once | INTRAMUSCULAR | Status: AC
  Administered 2021-07-07: 50 ug via SUBCUTANEOUS

## 2021-07-07 MED ORDER — DARBEPOETIN ALFA 60 MCG/0.3ML IJ SOSY
PREFILLED_SYRINGE | INTRAMUSCULAR | Status: AC
  Filled 2021-07-07: qty 0.3

## 2021-07-25 DIAGNOSIS — Z7969 Long term (current) use of other immunomodulators and immunosuppressants: Secondary | ICD-10-CM | POA: Diagnosis not present

## 2021-07-25 DIAGNOSIS — Z87891 Personal history of nicotine dependence: Secondary | ICD-10-CM | POA: Diagnosis not present

## 2021-07-25 DIAGNOSIS — D649 Anemia, unspecified: Secondary | ICD-10-CM | POA: Diagnosis not present

## 2021-07-25 DIAGNOSIS — D472 Monoclonal gammopathy: Secondary | ICD-10-CM | POA: Diagnosis not present

## 2021-07-25 DIAGNOSIS — M329 Systemic lupus erythematosus, unspecified: Secondary | ICD-10-CM | POA: Diagnosis not present

## 2021-07-25 DIAGNOSIS — J849 Interstitial pulmonary disease, unspecified: Secondary | ICD-10-CM | POA: Diagnosis not present

## 2021-07-28 DIAGNOSIS — J849 Interstitial pulmonary disease, unspecified: Secondary | ICD-10-CM | POA: Diagnosis not present

## 2021-07-29 DIAGNOSIS — Z1159 Encounter for screening for other viral diseases: Secondary | ICD-10-CM | POA: Diagnosis not present

## 2021-07-29 DIAGNOSIS — G629 Polyneuropathy, unspecified: Secondary | ICD-10-CM | POA: Diagnosis not present

## 2021-07-29 DIAGNOSIS — R700 Elevated erythrocyte sedimentation rate: Secondary | ICD-10-CM | POA: Diagnosis not present

## 2021-07-29 DIAGNOSIS — M25473 Effusion, unspecified ankle: Secondary | ICD-10-CM | POA: Diagnosis not present

## 2021-07-29 DIAGNOSIS — R0609 Other forms of dyspnea: Secondary | ICD-10-CM | POA: Diagnosis not present

## 2021-07-29 DIAGNOSIS — J849 Interstitial pulmonary disease, unspecified: Secondary | ICD-10-CM | POA: Diagnosis not present

## 2021-07-29 DIAGNOSIS — Z7952 Long term (current) use of systemic steroids: Secondary | ICD-10-CM | POA: Diagnosis not present

## 2021-07-29 DIAGNOSIS — Z79899 Other long term (current) drug therapy: Secondary | ICD-10-CM | POA: Diagnosis not present

## 2021-08-04 ENCOUNTER — Encounter (HOSPITAL_COMMUNITY)
Admission: RE | Admit: 2021-08-04 | Discharge: 2021-08-04 | Disposition: A | Discharge status: Home / Self Care | Payer: Medicare HMO | Source: Ambulatory Visit | Attending: Internal Medicine | Admitting: Internal Medicine

## 2021-08-04 VITALS — BP 140/84 | HR 84 | Temp 98.6°F | Resp 14 | Ht 63.0 in | Wt 175.7 lb

## 2021-08-04 DIAGNOSIS — N183 Chronic kidney disease, stage 3 unspecified: Secondary | ICD-10-CM | POA: Diagnosis not present

## 2021-08-04 DIAGNOSIS — D631 Anemia in chronic kidney disease: Secondary | ICD-10-CM | POA: Diagnosis not present

## 2021-08-04 DIAGNOSIS — M3219 Other organ or system involvement in systemic lupus erythematosus: Secondary | ICD-10-CM | POA: Insufficient documentation

## 2021-08-04 LAB — FERRITIN: Ferritin: 383 ng/mL — ABNORMAL HIGH (ref 11–307)

## 2021-08-04 LAB — IRON AND TIBC
Iron: 53 ug/dL (ref 28–170)
Saturation Ratios: 21 % (ref 10.4–31.8)
TIBC: 249 ug/dL — ABNORMAL LOW (ref 250–450)
UIBC: 196 ug/dL

## 2021-08-04 LAB — POCT HEMOGLOBIN-HEMACUE: Hemoglobin: 11.3 g/dL — ABNORMAL LOW (ref 12.0–15.0)

## 2021-08-04 MED ORDER — DARBEPOETIN ALFA 100 MCG/0.5ML IJ SOSY
PREFILLED_SYRINGE | INTRAMUSCULAR | Status: AC
  Filled 2021-08-04: qty 0.5

## 2021-08-04 MED ORDER — DARBEPOETIN ALFA 100 MCG/0.5ML IJ SOSY
50.0000 ug | PREFILLED_SYRINGE | Freq: Once | INTRAMUSCULAR | Status: AC
  Administered 2021-08-04: 50 ug via SUBCUTANEOUS

## 2021-08-16 DIAGNOSIS — R768 Other specified abnormal immunological findings in serum: Secondary | ICD-10-CM | POA: Diagnosis not present

## 2021-08-30 DIAGNOSIS — Z79899 Other long term (current) drug therapy: Secondary | ICD-10-CM | POA: Diagnosis not present

## 2021-09-13 ENCOUNTER — Encounter (HOSPITAL_COMMUNITY)
Admission: RE | Admit: 2021-09-13 | Discharge: 2021-09-13 | Disposition: A | Discharge status: Home / Self Care | Payer: Medicare HMO | Source: Ambulatory Visit | Attending: Internal Medicine | Admitting: Internal Medicine

## 2021-09-13 ENCOUNTER — Encounter (HOSPITAL_COMMUNITY): Payer: Self-pay

## 2021-09-13 VITALS — BP 126/86 | HR 99 | Temp 98.2°F | Resp 18 | Ht 64.0 in | Wt 175.7 lb

## 2021-09-13 DIAGNOSIS — N1831 Chronic kidney disease, stage 3a: Secondary | ICD-10-CM | POA: Insufficient documentation

## 2021-09-13 DIAGNOSIS — D631 Anemia in chronic kidney disease: Secondary | ICD-10-CM | POA: Diagnosis not present

## 2021-09-13 LAB — FERRITIN: Ferritin: 513 ng/mL — ABNORMAL HIGH (ref 11–307)

## 2021-09-13 LAB — IRON AND TIBC
Iron: 60 ug/dL (ref 28–170)
Saturation Ratios: 27 % (ref 10.4–31.8)
TIBC: 222 ug/dL — ABNORMAL LOW (ref 250–450)
UIBC: 162 ug/dL

## 2021-09-13 LAB — POCT HEMOGLOBIN-HEMACUE: Hemoglobin: 11.5 g/dL — ABNORMAL LOW (ref 12.0–15.0)

## 2021-09-13 MED ORDER — DARBEPOETIN ALFA 100 MCG/0.5ML IJ SOSY
PREFILLED_SYRINGE | INTRAMUSCULAR | Status: AC
  Filled 2021-09-13: qty 0.5

## 2021-09-13 MED ORDER — DARBEPOETIN ALFA 100 MCG/0.5ML IJ SOSY
50.0000 ug | PREFILLED_SYRINGE | Freq: Once | INTRAMUSCULAR | Status: AC
  Administered 2021-09-13: 50 ug via SUBCUTANEOUS

## 2021-09-15 ENCOUNTER — Encounter (HOSPITAL_COMMUNITY): Payer: Medicare HMO

## 2021-09-15 ENCOUNTER — Ambulatory Visit (HOSPITAL_COMMUNITY): Payer: Medicare HMO

## 2021-09-15 DIAGNOSIS — M7732 Calcaneal spur, left foot: Secondary | ICD-10-CM | POA: Diagnosis not present

## 2021-09-15 DIAGNOSIS — M7731 Calcaneal spur, right foot: Secondary | ICD-10-CM | POA: Diagnosis not present

## 2021-09-15 DIAGNOSIS — M25472 Effusion, left ankle: Secondary | ICD-10-CM | POA: Diagnosis not present

## 2021-09-15 DIAGNOSIS — M19072 Primary osteoarthritis, left ankle and foot: Secondary | ICD-10-CM | POA: Diagnosis not present

## 2021-09-15 DIAGNOSIS — M19071 Primary osteoarthritis, right ankle and foot: Secondary | ICD-10-CM | POA: Diagnosis not present

## 2021-09-15 DIAGNOSIS — Z79899 Other long term (current) drug therapy: Secondary | ICD-10-CM | POA: Diagnosis not present

## 2021-09-15 DIAGNOSIS — M329 Systemic lupus erythematosus, unspecified: Secondary | ICD-10-CM | POA: Diagnosis not present

## 2021-09-15 DIAGNOSIS — M25474 Effusion, right foot: Secondary | ICD-10-CM | POA: Diagnosis not present

## 2021-09-15 DIAGNOSIS — G6289 Other specified polyneuropathies: Secondary | ICD-10-CM | POA: Diagnosis not present

## 2021-09-16 DIAGNOSIS — G6289 Other specified polyneuropathies: Secondary | ICD-10-CM | POA: Diagnosis not present

## 2021-09-16 DIAGNOSIS — M329 Systemic lupus erythematosus, unspecified: Secondary | ICD-10-CM | POA: Diagnosis not present

## 2021-09-27 ENCOUNTER — Other Ambulatory Visit (HOSPITAL_COMMUNITY)
Admission: RE | Admit: 2021-09-27 | Discharge: 2021-09-27 | Disposition: A | Discharge status: Home / Self Care | Payer: Medicare HMO | Source: Ambulatory Visit | Attending: Rheumatology | Admitting: Rheumatology

## 2021-09-27 DIAGNOSIS — H26493 Other secondary cataract, bilateral: Secondary | ICD-10-CM | POA: Diagnosis not present

## 2021-09-27 DIAGNOSIS — Z961 Presence of intraocular lens: Secondary | ICD-10-CM | POA: Diagnosis not present

## 2021-09-27 DIAGNOSIS — Z0189 Encounter for other specified special examinations: Secondary | ICD-10-CM | POA: Diagnosis not present

## 2021-09-27 DIAGNOSIS — H16223 Keratoconjunctivitis sicca, not specified as Sjogren's, bilateral: Secondary | ICD-10-CM | POA: Diagnosis not present

## 2021-09-27 DIAGNOSIS — Z888 Allergy status to other drugs, medicaments and biological substances status: Secondary | ICD-10-CM | POA: Diagnosis not present

## 2021-09-27 DIAGNOSIS — N189 Chronic kidney disease, unspecified: Secondary | ICD-10-CM | POA: Diagnosis not present

## 2021-09-27 DIAGNOSIS — H04123 Dry eye syndrome of bilateral lacrimal glands: Secondary | ICD-10-CM | POA: Diagnosis not present

## 2021-09-27 DIAGNOSIS — H1033 Unspecified acute conjunctivitis, bilateral: Secondary | ICD-10-CM | POA: Diagnosis not present

## 2021-09-27 DIAGNOSIS — Z0000 Encounter for general adult medical examination without abnormal findings: Secondary | ICD-10-CM | POA: Diagnosis not present

## 2021-09-27 LAB — COMPREHENSIVE METABOLIC PANEL WITH GFR
ALT: 17 U/L (ref 0–44)
AST: 29 U/L (ref 15–41)
Albumin: 3 g/dL — ABNORMAL LOW (ref 3.5–5.0)
Alkaline Phosphatase: 7 U/L — ABNORMAL LOW (ref 38–126)
Anion gap: 15 (ref 5–15)
BUN: 12 mg/dL (ref 8–23)
CO2: 14 mmol/L — ABNORMAL LOW (ref 22–32)
Calcium: <4 mg/dL — CL (ref 8.9–10.3)
Chloride: 111 mmol/L (ref 98–111)
Creatinine, Ser: 1.23 mg/dL — ABNORMAL HIGH (ref 0.44–1.00)
GFR, Estimated: 48 mL/min — ABNORMAL LOW
Glucose, Bld: 89 mg/dL (ref 70–99)
Potassium: >7.5 mmol/L (ref 3.5–5.1)
Sodium: 140 mmol/L (ref 135–145)
Total Bilirubin: 1 mg/dL (ref 0.3–1.2)
Total Protein: 8.2 g/dL — ABNORMAL HIGH (ref 6.5–8.1)

## 2021-10-25 ENCOUNTER — Encounter (HOSPITAL_COMMUNITY)
Admission: RE | Admit: 2021-10-25 | Discharge: 2021-10-25 | Disposition: A | Discharge status: Home / Self Care | Payer: Medicare HMO | Source: Ambulatory Visit | Attending: Internal Medicine | Admitting: Internal Medicine

## 2021-10-25 ENCOUNTER — Encounter (HOSPITAL_COMMUNITY): Payer: Self-pay

## 2021-10-25 VITALS — BP 149/82 | HR 76 | Temp 97.9°F | Resp 18

## 2021-10-25 DIAGNOSIS — D631 Anemia in chronic kidney disease: Secondary | ICD-10-CM | POA: Diagnosis not present

## 2021-10-25 DIAGNOSIS — N183 Chronic kidney disease, stage 3 unspecified: Secondary | ICD-10-CM | POA: Insufficient documentation

## 2021-10-25 LAB — FERRITIN: Ferritin: 759 ng/mL — ABNORMAL HIGH (ref 11–307)

## 2021-10-25 LAB — RENAL FUNCTION PANEL
Albumin: 3.6 g/dL (ref 3.5–5.0)
Anion gap: 9 (ref 5–15)
BUN: 22 mg/dL (ref 8–23)
CO2: 24 mmol/L (ref 22–32)
Calcium: 9.4 mg/dL (ref 8.9–10.3)
Chloride: 108 mmol/L (ref 98–111)
Creatinine, Ser: 1.29 mg/dL — ABNORMAL HIGH (ref 0.44–1.00)
GFR, Estimated: 45 mL/min — ABNORMAL LOW (ref >60)
Glucose, Bld: 84 mg/dL (ref 70–99)
Phosphorus: 3.3 mg/dL (ref 2.5–4.6)
Potassium: 4.1 mmol/L (ref 3.5–5.1)
Sodium: 141 mmol/L (ref 135–145)

## 2021-10-25 LAB — URINALYSIS, ROUTINE W REFLEX MICROSCOPIC
Bilirubin Urine: NEGATIVE
Glucose, UA: NEGATIVE mg/dL
Hgb urine dipstick: NEGATIVE
Ketones, ur: NEGATIVE mg/dL
Leukocytes,Ua: NEGATIVE
Nitrite: NEGATIVE
Protein, ur: NEGATIVE mg/dL
Specific Gravity, Urine: 1.009 (ref 1.005–1.030)
pH: 5 (ref 5.0–8.0)

## 2021-10-25 LAB — IRON AND TIBC
Iron: 69 ug/dL (ref 28–170)
Saturation Ratios: 26 % (ref 10.4–31.8)
TIBC: 262 ug/dL (ref 250–450)
UIBC: 193 ug/dL

## 2021-10-25 LAB — POCT HEMOGLOBIN-HEMACUE: Hemoglobin: 10.5 g/dL — ABNORMAL LOW (ref 12.0–15.0)

## 2021-10-25 MED ORDER — DARBEPOETIN ALFA 60 MCG/0.3ML IJ SOSY: 50.0000 ug | PREFILLED_SYRINGE | INTRAMUSCULAR | Status: DC

## 2021-10-25 MED ORDER — DARBEPOETIN ALFA 60 MCG/0.3ML IJ SOSY
PREFILLED_SYRINGE | INTRAMUSCULAR | Status: AC
  Administered 2021-10-25: 50 ug
  Filled 2021-10-25: qty 0.3

## 2021-10-27 ENCOUNTER — Telehealth: Payer: Self-pay | Admitting: *Deleted

## 2021-10-27 NOTE — Patient Outreach (Signed)
  Care Coordination   10/27/2021 Name: Andrea Leblanc MRN: [id number] DOB: 05-Aug-1950   Care Coordination Outreach Attempts:  An unsuccessful telephone outreach was attempted today to offer the patient information about available care coordination services as a benefit of their health plan.   Follow Up Plan:  Additional outreach attempts will be made to offer the patient care coordination information and services.   Encounter Outcome:  No Answer  Care Coordination Interventions Activated:  No   Care Coordination Interventions:  No, not indicated    Valente David, RN, MSN, [facility] Care Management Care Management Coordinator [phone]

## 2021-11-01 ENCOUNTER — Telehealth: Payer: Self-pay | Admitting: *Deleted

## 2021-11-01 NOTE — Patient Outreach (Signed)
  Care Coordination   Initial Visit Note   11/01/2021 Name: Andrea Leblanc MRN: [id number] DOB: [birth year]  Andrea Leblanc is a 71 y.o. year old female who sees Andrea Blitz, MD for primary care. I spoke with  Andrea Leblanc by phone today.  What matters to the patients health and wellness today?  State she is "fine."  Denies need for [facility] engagement but does agree to have information mailed to the home.    SDOH assessments and interventions completed:  No     Care Coordination Interventions Activated:  No  Care Coordination Interventions:  No, not indicated   Follow up plan: No further intervention required.   Encounter Outcome:  Pt. Refused   Valente David, RN, MSN, [place] Care Management Care Management Coordinator [phone]

## 2021-11-02 DIAGNOSIS — G629 Polyneuropathy, unspecified: Secondary | ICD-10-CM | POA: Diagnosis not present

## 2021-11-02 DIAGNOSIS — M329 Systemic lupus erythematosus, unspecified: Secondary | ICD-10-CM | POA: Diagnosis not present

## 2021-11-02 DIAGNOSIS — R29898 Other symptoms and signs involving the musculoskeletal system: Secondary | ICD-10-CM | POA: Diagnosis not present

## 2021-11-03 ENCOUNTER — Other Ambulatory Visit (HOSPITAL_COMMUNITY)
Admission: RE | Admit: 2021-11-03 | Discharge: 2021-11-03 | Disposition: A | Discharge status: Home / Self Care | Payer: Medicare HMO | Source: Ambulatory Visit | Attending: Student in an Organized Health Care Education/Training Program | Admitting: Student in an Organized Health Care Education/Training Program

## 2021-11-03 DIAGNOSIS — G629 Polyneuropathy, unspecified: Secondary | ICD-10-CM | POA: Diagnosis not present

## 2021-11-03 LAB — COMPREHENSIVE METABOLIC PANEL
ALT: 14 U/L (ref 0–44)
AST: 20 U/L (ref 15–41)
Albumin: 3.3 g/dL — ABNORMAL LOW (ref 3.5–5.0)
Alkaline Phosphatase: 56 U/L (ref 38–126)
Anion gap: 6 (ref 5–15)
BUN: 18 mg/dL (ref 8–23)
CO2: 23 mmol/L (ref 22–32)
Calcium: 9.2 mg/dL (ref 8.9–10.3)
Chloride: 108 mmol/L (ref 98–111)
Creatinine, Ser: 1.34 mg/dL — ABNORMAL HIGH (ref 0.44–1.00)
GFR, Estimated: 43 mL/min — ABNORMAL LOW (ref >60)
Glucose, Bld: 89 mg/dL (ref 70–99)
Potassium: 4.3 mmol/L (ref 3.5–5.1)
Sodium: 137 mmol/L (ref 135–145)
Total Bilirubin: 0.5 mg/dL (ref 0.3–1.2)
Total Protein: 7.8 g/dL (ref 6.5–8.1)

## 2021-11-08 DIAGNOSIS — K224 Dyskinesia of esophagus: Secondary | ICD-10-CM | POA: Diagnosis not present

## 2021-11-08 DIAGNOSIS — K229 Disease of esophagus, unspecified: Secondary | ICD-10-CM | POA: Diagnosis not present

## 2021-11-08 DIAGNOSIS — R1310 Dysphagia, unspecified: Secondary | ICD-10-CM | POA: Diagnosis not present

## 2021-11-08 DIAGNOSIS — K220 Achalasia of cardia: Secondary | ICD-10-CM | POA: Diagnosis not present

## 2021-11-10 ENCOUNTER — Ambulatory Visit (INDEPENDENT_AMBULATORY_CARE_PROVIDER_SITE_OTHER): Payer: Medicare HMO

## 2021-11-10 ENCOUNTER — Ambulatory Visit (INDEPENDENT_AMBULATORY_CARE_PROVIDER_SITE_OTHER): Payer: Medicare HMO | Admitting: Podiatry

## 2021-11-10 DIAGNOSIS — M216X1 Other acquired deformities of right foot: Secondary | ICD-10-CM

## 2021-11-10 DIAGNOSIS — M898X9 Other specified disorders of bone, unspecified site: Secondary | ICD-10-CM

## 2021-11-10 DIAGNOSIS — Z01818 Encounter for other preprocedural examination: Secondary | ICD-10-CM

## 2021-11-10 NOTE — Progress Notes (Signed)
Subjective:  Patient ID: Andrea Leblanc, female    DOB: [birth year],  MRN: [id number]  Chief Complaint  Patient presents with   Callouses    71 y.o. female presents with the above complaint.  Patient presents with right fifth plantarflexed metatarsal as well as right dorsal midfoot exostosis.  Patient states pain for touch is progressive gotten worse worse with ambulation she has failed all conservative care.  She has been trying conservative care with padding protecting offloading for the last year or 2 since he last saw me.  However now is becoming more bothersome she is tried shoe gear modification she would like to discuss surgical options her pain scale 7 out of 10 hurts with ambulation worse with pressure sharp shooting in nature   Review of Systems: Negative except as noted in the HPI. Denies N/V/F/Ch.  Past Medical History:  Diagnosis Date   Anemia    Asthma    Chronic kidney disease    Cough variant asthma    Diabetes mellitus, type II (HCC)    Hammer toe    Uterine fibroid     Current Outpatient Medications:    amLODipine (NORVASC) 5 MG tablet, Take 5 mg by mouth daily., Disp: , Rfl:    Belimumab (BENLYSTA) 200 MG/ML SOAJ, Inject 200 mg into the skin once a week., Disp: , Rfl:    benazepril-hydrochlorthiazide (LOTENSIN HCT) 20-12.5 MG tablet, , Disp: , Rfl:    betamethasone dipropionate 0.05 % lotion, Apply topically 2 (two) times daily., Disp: , Rfl:    Biotin 5000 MCG CAPS, Take by mouth daily., Disp: , Rfl:    budesonide-formoterol (SYMBICORT) 160-4.5 MCG/ACT inhaler, Inhale 2 puffs into the lungs 2 (two) times daily., Disp: , Rfl:    calcium-vitamin D (OSCAL WITH D) 500-200 MG-UNIT tablet, Take 1 tablet by mouth., Disp: , Rfl:    Cholecalciferol (VITAMIN D3 PO), Take 2,000 Int'l Units by mouth daily., Disp: , Rfl:    clobetasol (TEMOVATE) 0.05 % external solution, Apply 1 application topically 2 (two) times daily., Disp: , Rfl:    co-enzyme Q-10 30 MG capsule, Take  30 mg by mouth daily., Disp: , Rfl:    Darbepoetin Alfa (ARANESP) 100 MCG/0.5ML SOSY injection, Inject 200 mcg into the skin every 8 (eight) weeks. , Disp: , Rfl:    Darbepoetin Alfa (ARANESP) 200 MCG/0.4ML SOSY injection, Inject 200 mcg into the skin every 8 (eight) weeks. , Disp: , Rfl:    Darbepoetin Alfa (ARANESP) 40 MCG/0.4ML SOSY injection, Inject 50 mcg into the skin once., Disp: , Rfl:    Darbepoetin Alfa (ARANESP) 60 MCG/0.3ML SOSY injection, Inject 50 mcg into the skin., Disp: , Rfl:    diphenhydrAMINE (BENADRYL) 25 mg capsule, Take 25 mg by mouth every 6 (six) hours as needed (one or two tabs)., Disp: , Rfl:    DULoxetine (CYMBALTA) 60 MG capsule, Take 60 mg by mouth daily., Disp: , Rfl:    epoetin alfa-epbx (RETACRIT) 99833 UNIT/ML injection, 20,000 Units every 30 (thirty) days., Disp: , Rfl:    Ferrous Sulfate (IRON) 325 (65 Fe) MG TABS, Take by mouth daily., Disp: , Rfl:    fluticasone (FLONASE) 50 MCG/ACT nasal spray, Place 2 sprays into both nostrils 2 (two) times daily., Disp: , Rfl:    furosemide (LASIX) 40 MG tablet, Take 40 mg by mouth., Disp: , Rfl:    gabapentin (NEURONTIN) 300 MG capsule, '600mg'$  qam, '300mg'$  at noon, '600mg'$  at night, Disp: , Rfl:    gabapentin (NEURONTIN) 600  MG tablet, Take 600 mg by mouth 3 (three) times daily. , Disp: , Rfl:    gabapentin (NEURONTIN) 800 MG tablet, Take 800 mg by mouth 3 (three) times daily. , Disp: , Rfl:    halobetasol (ULTRAVATE) 0.05 % cream, Apply topically 2 (two) times daily., Disp: , Rfl:    hydroxychloroquine (PLAQUENIL) 200 MG tablet, Take by mouth 2 (two) times daily., Disp: , Rfl:    loratadine (CLARITIN) 10 MG tablet, Take 10 mg by mouth daily., Disp: , Rfl:    magnesium oxide (MAG-OX) 400 MG tablet, Take 400 mg by mouth daily., Disp: , Rfl:    methocarbamol (ROBAXIN) 750 MG tablet, Take 750 mg by mouth 2 (two) times daily as needed for muscle spasms., Disp: , Rfl:    METHOTREXATE PO, Take by mouth., Disp: , Rfl:    MISC NATURAL  PRODUCTS PO, Take 2 tablets by mouth daily., Disp: , Rfl:    montelukast (SINGULAIR) 10 MG tablet, Take 10 mg by mouth at bedtime., Disp: , Rfl:    mycophenolate (CELLCEPT) 500 MG tablet, Take by mouth 3 (three) times daily., Disp: , Rfl:    Omega 3-6-9 Fatty Acids (TRIPLE OMEGA COMPLEX PO), '500mg'$  krill, '500mg'$  fish oil, 500iu vitamin D - Take two tablets daily., Disp: , Rfl:    Omega-3 Fatty Acids (OMEGA-3 FISH OIL PO), Take one capsule daily., Disp: , Rfl:    omeprazole (PRILOSEC) 40 MG capsule, Take 40 mg by mouth daily., Disp: , Rfl:    pantoprazole (PROTONIX) 40 MG tablet, Take 40 mg by mouth every other day., Disp: , Rfl:    predniSONE (DELTASONE) 5 MG tablet, Take 7 mg by mouth daily with breakfast. , Disp: , Rfl:    RABEprazole (ACIPHEX) 20 MG tablet, Take 20 mg by mouth daily., Disp: , Rfl:    sertraline (ZOLOFT) 25 MG tablet, Take 25 mg by mouth daily., Disp: , Rfl:    traMADol (ULTRAM) 50 MG tablet, Take 50 mg by mouth 3 (three) times daily as needed., Disp: , Rfl:    triamcinolone cream (KENALOG) 0.1 %, Apply 1 application. topically 3 (three) times daily., Disp: , Rfl:    vitamin B-12 (CYANOCOBALAMIN) 1000 MCG tablet, Take 1,000 mcg by mouth daily., Disp: , Rfl:   Social History   Tobacco Use  Smoking Status Former   Types: Cigarettes   Quit date: 01/31/1986   Years since quitting: 35.8  Smokeless Tobacco Never  Tobacco Comments   pt unsure of how long or how much she smoked betofre quitting.     No Known Allergies Objective:  There were no vitals filed for this visit. There is no height or weight on file to calculate BMI. Constitutional Well developed. Well nourished.  Vascular Dorsalis pedis pulses palpable bilaterally. Posterior tibial pulses palpable bilaterally. Capillary refill normal to all digits.  No cyanosis or clubbing noted. Pedal hair growth normal.  Neurologic Normal speech. Oriented to person, place, and time. Epicritic sensation to light touch  grossly present bilaterally.  Dermatologic Nails well groomed and normal in appearance. No open wounds. No skin lesions.  Orthopedic: Pain on palpation to the right plantarflexed fifth metatarsal with submetatarsal 5 pain.  Pain on palpation right dorsal midfoot exostosis positive numbness and tingling noted due to compression of the dorsal nerve.   Radiographs: 3 views of skeletally mature adult right foot:Midfoot arthritis noted moderate to severe in nature.  Plantar and posterior heel spurring noted.  Plantarflexed fifth metatarsal noted. Assessment:   1.  Plantar flexed metatarsal, right   2. Bony exostosis   3. Encounter for preoperative examination for general surgical procedure    Plan:  Patient was evaluated and treated and all questions answered.  Right plantarflexed fifth metatarsal and right midfoot exostosis -All questions and concerns were discussed with the patient extensive detail.  Given that the patient has failed all conservative care I believe patient will benefit from floating osteotomy of the fifth metatarsal as well as exostectomy of the right dorsal midfoot.  I discussed my preoperative intraoperative postoperative plan in extensive detail she states understand like to proceed with surgery.  She will be weightbearing as tolerated in surgical shoe -Informed surgical risk consent was reviewed and read aloud to the patient.  I reviewed the films.  I have discussed my findings with the patient in great detail.  I have discussed all risks including but not limited to infection, stiffness, scarring, limp, disability, deformity, damage to blood vessels and nerves, numbness, poor healing, need for braces, arthritis, chronic pain, amputation, death.  All benefits and realistic expectations discussed in great detail.  I have made no promises as to the outcome.  I have provided realistic expectations.  I have offered the patient a 2nd opinion, which they have declined and assured me they  preferred to proceed despite the risks   No follow-ups on file.

## 2021-11-11 DIAGNOSIS — Z6833 Body mass index (BMI) 33.0-33.9, adult: Secondary | ICD-10-CM | POA: Diagnosis not present

## 2021-11-11 DIAGNOSIS — Z299 Encounter for prophylactic measures, unspecified: Secondary | ICD-10-CM | POA: Diagnosis not present

## 2021-11-11 DIAGNOSIS — Z1339 Encounter for screening examination for other mental health and behavioral disorders: Secondary | ICD-10-CM | POA: Diagnosis not present

## 2021-11-11 DIAGNOSIS — Z1331 Encounter for screening for depression: Secondary | ICD-10-CM | POA: Diagnosis not present

## 2021-11-11 DIAGNOSIS — Z0000 Encounter for general adult medical examination without abnormal findings: Secondary | ICD-10-CM | POA: Diagnosis not present

## 2021-11-11 DIAGNOSIS — R5383 Other fatigue: Secondary | ICD-10-CM | POA: Diagnosis not present

## 2021-11-11 DIAGNOSIS — I10 Essential (primary) hypertension: Secondary | ICD-10-CM | POA: Diagnosis not present

## 2021-11-11 DIAGNOSIS — E7800 Pure hypercholesterolemia, unspecified: Secondary | ICD-10-CM | POA: Diagnosis not present

## 2021-11-11 DIAGNOSIS — Z7189 Other specified counseling: Secondary | ICD-10-CM | POA: Diagnosis not present

## 2021-11-15 ENCOUNTER — Telehealth: Payer: Self-pay

## 2021-11-15 NOTE — Telephone Encounter (Signed)
DOS 12/12/2021  METATARSAL OSTEOTOMY 5TH RT - 28308 TARSAL EXOSTECTOMY RT - 28104  [employer]  [employer] [place] [employer]  The following codes do not require a pre-authorization Created on 11/15/2021  Plan Year 02/20/2021 - 02/19/9998  Service info [phone] Excision or curettage of bone cyst or benign tumor, tarsal or metatarsal, except talus or calcaneus  Service Foot Surgeries: Bunionectomy and Hammertoe   Procedure code 28308  Submission date 11/15/2021 1:40 PM  Date of service 12/12/2021  Voided Authorization #[id number]  Tracking #[id number]  Note: Administrative void. Reasoning: Prior authorization not required for service. These codes can only be reviewed if the request pertains to a bunion, bunionette or hammertoe

## 2021-11-16 DIAGNOSIS — M329 Systemic lupus erythematosus, unspecified: Secondary | ICD-10-CM | POA: Diagnosis not present

## 2021-11-16 DIAGNOSIS — G6289 Other specified polyneuropathies: Secondary | ICD-10-CM | POA: Diagnosis not present

## 2021-11-26 DIAGNOSIS — K047 Periapical abscess without sinus: Secondary | ICD-10-CM | POA: Diagnosis not present

## 2021-11-26 DIAGNOSIS — R220 Localized swelling, mass and lump, head: Secondary | ICD-10-CM | POA: Diagnosis not present

## 2021-12-05 ENCOUNTER — Encounter: Payer: Self-pay | Admitting: Plastic Surgery

## 2021-12-05 ENCOUNTER — Ambulatory Visit (INDEPENDENT_AMBULATORY_CARE_PROVIDER_SITE_OTHER): Payer: Medicare HMO | Admitting: Plastic Surgery

## 2021-12-05 VITALS — BP 113/73 | HR 73 | Ht 63.5 in | Wt 163.2 lb

## 2021-12-05 DIAGNOSIS — R293 Abnormal posture: Secondary | ICD-10-CM | POA: Diagnosis not present

## 2021-12-05 DIAGNOSIS — Z6828 Body mass index (BMI) 28.0-28.9, adult: Secondary | ICD-10-CM

## 2021-12-05 DIAGNOSIS — N62 Hypertrophy of breast: Secondary | ICD-10-CM

## 2021-12-05 DIAGNOSIS — M545 Low back pain, unspecified: Secondary | ICD-10-CM

## 2021-12-05 NOTE — Progress Notes (Signed)
Referring Provider Monico Blitz, [place],  [place] [id number]   CC:  Chief Complaint  Patient presents with   Advice Only      Andrea Leblanc is an 71 y.o. female.  HPI: Andrea Leblanc is a 71 year old female who is referred for evaluation of symptomatic macromastia.  In addition to multiple other medical problems she notes that she has been having more difficulty maintaining an upright position and she feels that this is due to the large size of her breasts.  Andrea Leblanc is currently being evaluated for a widespread myopathy as well as neuropathy in her feet.  She has a history of lupus and a history of steroid use.  No Known Allergies  Outpatient Encounter Medications as of 12/05/2021  Medication Sig Note   amLODipine (NORVASC) 5 MG tablet Take 5 mg by mouth daily.    benazepril-hydrochlorthiazide (LOTENSIN HCT) 20-12.5 MG tablet     betamethasone dipropionate 0.05 % lotion Apply topically 2 (two) times daily.    Biotin 5000 MCG CAPS Take by mouth daily.    calcium-vitamin D (OSCAL WITH D) 500-200 MG-UNIT tablet Take 1 tablet by mouth.    Cholecalciferol (VITAMIN D3 PO) Take 2,000 Int'l Units by mouth daily.    clobetasol (TEMOVATE) 0.05 % external solution Apply 1 application topically 2 (two) times daily.    co-enzyme Q-10 30 MG capsule Take 30 mg by mouth daily.    Darbepoetin Alfa (ARANESP) 100 MCG/0.5ML SOSY injection Inject 200 mcg into the skin every 8 (eight) weeks.     Darbepoetin Alfa (ARANESP) 200 MCG/0.4ML SOSY injection Inject 200 mcg into the skin every 8 (eight) weeks.     Darbepoetin Alfa (ARANESP) 40 MCG/0.4ML SOSY injection Inject 50 mcg into the skin once.    Darbepoetin Alfa (ARANESP) 60 MCG/0.3ML SOSY injection Inject 50 mcg into the skin.    diphenhydrAMINE (BENADRYL) 25 mg capsule Take 25 mg by mouth every 6 (six) hours as needed (one or two tabs).    DULoxetine (CYMBALTA) 60 MG capsule Take 60 mg by mouth daily.    epoetin alfa-epbx (RETACRIT) 56387  UNIT/ML injection 20,000 Units every 30 (thirty) days.    Ferrous Sulfate (IRON) 325 (65 Fe) MG TABS Take by mouth daily.    furosemide (LASIX) 40 MG tablet Take 40 mg by mouth.    gabapentin (NEURONTIN) 300 MG capsule '600mg'$  qam, '300mg'$  at noon, '600mg'$  at night    gabapentin (NEURONTIN) 800 MG tablet Take 800 mg by mouth 3 (three) times daily.     halobetasol (ULTRAVATE) 0.05 % cream Apply topically 2 (two) times daily.    hydroxychloroquine (PLAQUENIL) 200 MG tablet Take by mouth 2 (two) times daily.    loratadine (CLARITIN) 10 MG tablet Take 10 mg by mouth daily. 03/15/2018: Also known as Alavert   magnesium oxide (MAG-OX) 400 MG tablet Take 400 mg by mouth daily.    METHOTREXATE PO Take by mouth.    MISC NATURAL PRODUCTS PO Take 2 tablets by mouth daily.    montelukast (SINGULAIR) 10 MG tablet Take 10 mg by mouth at bedtime.    mycophenolate (CELLCEPT) 500 MG tablet Take by mouth 3 (three) times daily.    Omega 3-6-9 Fatty Acids (TRIPLE OMEGA COMPLEX PO) '500mg'$  krill, '500mg'$  fish oil, 500iu vitamin D - Take two tablets daily.    Omega-3 Fatty Acids (OMEGA-3 FISH OIL PO) Take one capsule daily.    omeprazole (PRILOSEC) 40 MG capsule Take 40 mg by mouth  daily.    pantoprazole (PROTONIX) 40 MG tablet Take 40 mg by mouth every other day.    predniSONE (DELTASONE) 5 MG tablet Take 7 mg by mouth daily with breakfast.  04/14/2021: Only taking 5 mg daily   RABEprazole (ACIPHEX) 20 MG tablet Take 20 mg by mouth daily. 07/28/2019: No longer taking   sertraline (ZOLOFT) 25 MG tablet Take 25 mg by mouth daily.    traMADol (ULTRAM) 50 MG tablet Take 50 mg by mouth 3 (three) times daily as needed.    triamcinolone cream (KENALOG) 0.1 % Apply 1 application. topically 3 (three) times daily.    vitamin B-12 (CYANOCOBALAMIN) 1000 MCG tablet Take 1,000 mcg by mouth daily.    [DISCONTINUED] Belimumab (BENLYSTA) 200 MG/ML SOAJ Inject 200 mg into the skin once a week. 04/14/2021: Pt not taking   [DISCONTINUED]  budesonide-formoterol (SYMBICORT) 160-4.5 MCG/ACT inhaler Inhale 2 puffs into the lungs 2 (two) times daily.    [DISCONTINUED] fluticasone (FLONASE) 50 MCG/ACT nasal spray Place 2 sprays into both nostrils 2 (two) times daily.    [DISCONTINUED] gabapentin (NEURONTIN) 600 MG tablet Take 600 mg by mouth 3 (three) times daily.     [DISCONTINUED] methocarbamol (ROBAXIN) 750 MG tablet Take 750 mg by mouth 2 (two) times daily as needed for muscle spasms.    No facility-administered encounter medications on file as of 12/05/2021.     Past Medical History:  Diagnosis Date   Anemia    Asthma    Chronic Leblanc disease    Cough variant asthma    Diabetes mellitus, type II ([place])    Hammer toe    Uterine fibroid     Past Surgical History:  Procedure Laterality Date   SALIVARY GLAND SURGERY     UTERINE FIBROID SURGERY      Family History  Problem Relation Age of Onset   Healthy Mother     Social History   Social History Narrative   Lives at home alone.   Right-handed.   No caffeine use.     Review of Systems General: Denies fevers, chills, weight loss CV: Denies chest pain, shortness of breath, palpitations Breasts: Large ptotic breasts.  She denies nipple discharge or masses on breast exam.  She states she had a mammogram in April of this year.  Physical Exam    12/05/2021   11:38 AM 10/25/2021    3:39 PM 09/13/2021    4:02 PM  Vitals with BMI  Height 5' 3.5"  '5\' 4"'$   Weight 163 lbs 3 oz  175 lbs 11 oz  BMI 10.62  69.48  Systolic 546 270 350  Diastolic 73 82 86  Pulse 73 76 99    General:  No acute distress,  Alert and oriented, Non-Toxic, Normal speech and affect. BMI 28.5 BSA 1.82 Standing and ambulation are accomplished with great difficulty and require assistance. Breast: Patient has  grade 3 ptosis on the left and grade 2 ptosis on the right.  Sternal notch to nipple distance on the right is 31 cm sternal notch to nipple distance on the left is 34 cm.  There are no  palpable masses on physical exam.  No evidence of nipple discharge or nipple retraction. Mammogram: Mammograms from 2022 and 2023 were both BI-RADS 1.  Assessment/Plan Breast ptosis: Andrea Leblanc does have heavy breast however I am not convinced that her breasts are heavy enough to be causing either the forward bending that she complains of or low back pain.  I discussed with her  that I did not think that a breast reduction would improve either her posture or her low back pain.  I suspect that most of her difficulties come from her generalized myopathy and deconditioning.  We did discuss breast reduction.  Showed her the location of the incisions and subsequent scars.  We discussed the risks of bleeding, infection, and fluid collection.  We discussed the specific potential complications of nipple loss due to ischemia and changes in nipple sensation.  We also discussed the fact that her mammograms may be more difficult to interpret after breast reduction.  She stated that she has no assistance at home and for this reason she will require spending the night in the hospital.  I did inform her that she would have drains postoperatively.  She asked about cup size and breast size postoperatively.  We discussed the fact that cup sizes are not standardized and I cannot promise any specific cup size.  Tell her that I would remove as much breast tissue as possible and leave her with an aesthetically pleasing breast that was proportionate to her size.  I believe that I can remove 300 to 400 g per breast with a breast reduction.  We will have a follow-up telemetry appointment after the insurance companies have made a determination as to whether they will proceed.  Camillia Herter 12/05/2021, 12:25 PM

## 2021-12-06 ENCOUNTER — Encounter (HOSPITAL_COMMUNITY)
Admission: RE | Admit: 2021-12-06 | Discharge: 2021-12-06 | Disposition: A | Discharge status: Home / Self Care | Payer: Medicare HMO | Source: Ambulatory Visit | Attending: Internal Medicine | Admitting: Internal Medicine

## 2021-12-06 ENCOUNTER — Encounter (HOSPITAL_COMMUNITY): Payer: Self-pay

## 2021-12-06 VITALS — BP 118/73 | HR 97 | Temp 97.5°F | Resp 18 | Ht 63.5 in | Wt 163.2 lb

## 2021-12-06 DIAGNOSIS — D631 Anemia in chronic kidney disease: Secondary | ICD-10-CM | POA: Diagnosis not present

## 2021-12-06 DIAGNOSIS — N183 Chronic kidney disease, stage 3 unspecified: Secondary | ICD-10-CM | POA: Insufficient documentation

## 2021-12-06 MED ORDER — DARBEPOETIN ALFA 100 MCG/0.5ML IJ SOSY: 50.0000 ug | PREFILLED_SYRINGE | Freq: Once | INTRAMUSCULAR | Status: DC

## 2021-12-06 MED ORDER — DARBEPOETIN ALFA 100 MCG/0.5ML IJ SOSY
PREFILLED_SYRINGE | INTRAMUSCULAR | Status: AC
  Administered 2021-12-06: 100 ug
  Filled 2021-12-06: qty 0.5

## 2021-12-08 DIAGNOSIS — M329 Systemic lupus erythematosus, unspecified: Secondary | ICD-10-CM | POA: Diagnosis not present

## 2021-12-08 DIAGNOSIS — G6289 Other specified polyneuropathies: Secondary | ICD-10-CM | POA: Diagnosis not present

## 2021-12-12 ENCOUNTER — Other Ambulatory Visit: Payer: Self-pay | Admitting: Podiatry

## 2021-12-12 MED ORDER — OXYCODONE-ACETAMINOPHEN 5-325 MG PO TABS: 1.0000 | ORAL_TABLET | ORAL | 0 refills | Status: DC | PRN

## 2021-12-12 MED ORDER — IBUPROFEN 800 MG PO TABS: 800.0000 mg | ORAL_TABLET | Freq: Four times a day (QID) | ORAL | 1 refills | Status: DC | PRN

## 2021-12-14 ENCOUNTER — Telehealth: Payer: Self-pay

## 2021-12-14 LAB — POCT HEMOGLOBIN-HEMACUE: Hemoglobin: 10.3 g/dL — ABNORMAL LOW (ref 12.0–15.0)

## 2021-12-14 NOTE — Telephone Encounter (Signed)
Andrea Leblanc was scheduled for surgery with Dr. Posey Pronto on 12/12/2021. Per anesthesiologist at [facility]   "Patient with dry crackles from base to mid-lung and more lower extremity edema today; case cancelled. Andrea Leblanc [phone]/23/2023 09:42) - Andrea Leblanc (12/12/2021 09:42) --given this as well as interstitial lung disease and generalised weakness, would recommend doing this at hospital"  Spoke to [place] today and informed her that we will need to move her surgery to the hospital. Dr. Posey Pronto does not have availability at the hospital till next year. She stated she would like to hold off for now and she will let me know about rescheduling.

## 2021-12-19 DIAGNOSIS — Z9889 Other specified postprocedural states: Secondary | ICD-10-CM | POA: Diagnosis not present

## 2021-12-19 DIAGNOSIS — K220 Achalasia of cardia: Secondary | ICD-10-CM | POA: Diagnosis not present

## 2021-12-19 DIAGNOSIS — R1319 Other dysphagia: Secondary | ICD-10-CM | POA: Diagnosis not present

## 2021-12-21 ENCOUNTER — Encounter: Payer: Medicare HMO | Admitting: Podiatry

## 2022-01-02 ENCOUNTER — Ambulatory Visit (INDEPENDENT_AMBULATORY_CARE_PROVIDER_SITE_OTHER): Payer: Medicare HMO | Admitting: Plastic Surgery

## 2022-01-02 ENCOUNTER — Encounter: Payer: Self-pay | Admitting: Plastic Surgery

## 2022-01-02 DIAGNOSIS — N62 Hypertrophy of breast: Secondary | ICD-10-CM

## 2022-01-02 NOTE — Progress Notes (Addendum)
I had a phone follow-up this morning with Mrs. Andrea Leblanc.  She is still interested in a bilateral breast reduction.  She is undergoing infusions for her myopathy which she believes is helping.  She still believes that her breasts are causing her to become progressively more bent over throughout the day.  She also feels that they are still causing back pain.  We are still waiting to hear from the insurance company whether she will be approved for the reduction.  I told her we will get back with her once this approval or rejection is received.  We spent 10 minutes on the phone call and I spent an additional 10 minutes on documentation.  I was in my office at [facility] and [name] was in her home at the time of our call.

## 2022-01-04 ENCOUNTER — Encounter: Payer: Medicare HMO | Admitting: Podiatry

## 2022-01-04 DIAGNOSIS — G6289 Other specified polyneuropathies: Secondary | ICD-10-CM | POA: Diagnosis not present

## 2022-01-05 ENCOUNTER — Telehealth: Payer: Self-pay | Admitting: *Deleted

## 2022-01-05 NOTE — Telephone Encounter (Signed)
Call received from [name] following up from her tele appointment with Dr. Lovena Le this week. States she initially saw him one month ago. She would like to know if there has been a response from her insurance company. I gave her her the contact # for our surgery coordinator, Nira Conn, and transferred the call to her.

## 2022-01-05 NOTE — Telephone Encounter (Signed)
Auth submitted for [facility] via [employer] portal for [id number]

## 2022-01-06 DIAGNOSIS — R768 Other specified abnormal immunological findings in serum: Secondary | ICD-10-CM | POA: Diagnosis not present

## 2022-01-06 DIAGNOSIS — M329 Systemic lupus erythematosus, unspecified: Secondary | ICD-10-CM | POA: Diagnosis not present

## 2022-01-06 DIAGNOSIS — R531 Weakness: Secondary | ICD-10-CM | POA: Diagnosis not present

## 2022-01-06 DIAGNOSIS — K2289 Other specified disease of esophagus: Secondary | ICD-10-CM | POA: Diagnosis not present

## 2022-01-06 DIAGNOSIS — K921 Melena: Secondary | ICD-10-CM | POA: Diagnosis not present

## 2022-01-06 DIAGNOSIS — R1319 Other dysphagia: Secondary | ICD-10-CM | POA: Diagnosis not present

## 2022-01-06 DIAGNOSIS — R634 Abnormal weight loss: Secondary | ICD-10-CM | POA: Diagnosis not present

## 2022-01-06 DIAGNOSIS — Z538 Procedure and treatment not carried out for other reasons: Secondary | ICD-10-CM | POA: Diagnosis not present

## 2022-01-06 DIAGNOSIS — R1310 Dysphagia, unspecified: Secondary | ICD-10-CM | POA: Diagnosis not present

## 2022-01-06 DIAGNOSIS — K229 Disease of esophagus, unspecified: Secondary | ICD-10-CM | POA: Diagnosis not present

## 2022-01-06 DIAGNOSIS — J849 Interstitial pulmonary disease, unspecified: Secondary | ICD-10-CM | POA: Diagnosis not present

## 2022-01-06 DIAGNOSIS — B3781 Candidal esophagitis: Secondary | ICD-10-CM | POA: Diagnosis not present

## 2022-01-06 DIAGNOSIS — K208 Other esophagitis without bleeding: Secondary | ICD-10-CM | POA: Diagnosis not present

## 2022-01-16 ENCOUNTER — Telehealth: Payer: Self-pay

## 2022-01-16 NOTE — Telephone Encounter (Signed)
Patient called in saying she got a letter advising surgery was approved Auth number is [id number].

## 2022-01-17 ENCOUNTER — Encounter (HOSPITAL_COMMUNITY)
Admission: RE | Admit: 2022-01-17 | Discharge: 2022-01-17 | Disposition: A | Discharge status: Home / Self Care | Payer: Medicare HMO | Source: Ambulatory Visit | Attending: Internal Medicine | Admitting: Internal Medicine

## 2022-01-17 VITALS — BP 149/76 | HR 83 | Temp 97.8°F | Resp 16

## 2022-01-17 DIAGNOSIS — N183 Chronic kidney disease, stage 3 unspecified: Secondary | ICD-10-CM | POA: Diagnosis not present

## 2022-01-17 DIAGNOSIS — Z01812 Encounter for preprocedural laboratory examination: Secondary | ICD-10-CM | POA: Diagnosis not present

## 2022-01-17 LAB — POCT HEMOGLOBIN-HEMACUE: Hemoglobin: 11.2 g/dL — ABNORMAL LOW (ref 12.0–15.0)

## 2022-01-17 MED ORDER — DARBEPOETIN ALFA 100 MCG/0.5ML IJ SOSY
50.0000 ug | PREFILLED_SYRINGE | Freq: Once | INTRAMUSCULAR | Status: AC
  Administered 2022-01-17: 50 ug via SUBCUTANEOUS

## 2022-01-17 MED ORDER — DARBEPOETIN ALFA 60 MCG/0.3ML IJ SOSY
PREFILLED_SYRINGE | INTRAMUSCULAR | Status: AC
  Filled 2022-01-17: qty 0.3

## 2022-01-17 NOTE — Progress Notes (Signed)
Patient was stuck for her labs, only able to get Hgb, unable to get iron panel and ferritin(not enough blood) and patient refused to be stuck again.  Her aranesp was given as ordered for a hgb of 11.2.

## 2022-01-18 ENCOUNTER — Encounter: Payer: Self-pay | Admitting: Physician Assistant

## 2022-01-18 ENCOUNTER — Ambulatory Visit (INDEPENDENT_AMBULATORY_CARE_PROVIDER_SITE_OTHER): Payer: Medicare HMO | Admitting: Physician Assistant

## 2022-01-18 VITALS — BP 155/83 | HR 74 | Ht 64.0 in | Wt 157.0 lb

## 2022-01-18 DIAGNOSIS — N62 Hypertrophy of breast: Secondary | ICD-10-CM

## 2022-01-18 NOTE — Progress Notes (Signed)
Patient ID: Andrea Leblanc, female    DOB: [birth year], 71 y.o.   MRN: [id number]  Chief Complaint  Patient presents with   Pre-op Exam      ICD-10-CM   1. Macromastia  N62        History of Present Illness: Andrea Leblanc is a 71 y.o.  female  with a history of macromastia.  She presents for preoperative evaluation for upcoming procedure, bilateral breast reduction, scheduled for 01/24/2022 with Dr.  Lovena Le .  The patient has not had problems with anesthesia.  She has recently had upper and lower endoscopies without complications from anesthesia.  She has a barium swallow study scheduled for 01/23/2022 to evaluate her dysphagia.  She reports that she may have some dental work as well before the end of the year.  She does not have a cardiologist she sees regularly and was unfamiliar with her 2021 echocardiogram with Dr. Manuella Ghazi at [facility].  She does have diuretics prescribed, but she states that she takes them as needed for peripheral edema since she was young.  She has been receiving EPO injections from her nephrologist, Dr. Johnney Ou.  She states that she is currently on Plaquenil and prednisone for her SLE and generalized myopathy, followed by her rheumatologist Dr. Manuella Ghazi.  She states that her primary care provider is not Monico Blitz but rather Bernardo Heater FNP.  Discussed with patient that we will need to obtain clearances from both her primary care provider as well as her rheumatologist.  Her rheumatologist has also referred her to a pulmonologist given her concerns, currently not yet scheduled until February 2023.  Discussed with Dr. Lovena Le and he would prefer that we hold off on surgery until after she has been evaluated by pulmonology and cleared on their end.  She also understands that she will need to be holding her DMARD and steroids for 2 weeks before and after surgery if she is to move forward with our services.  She will also need to hold all ointments and supplements x 1 week  preoperatively.  She does endorse a family history of DVT, her mother.  No personal history of DVT, cancer, MI, CVA, or anticoagulation.  Patient ambulates with assistance of a cane and lives alone, will require transportation services on day of surgery.  Summary of Previous Visit: She was initially seen for consult by Dr. Lovena Le on 12/05/2021.  At that time, complained of chronic upper back and neck discomfort in the context of large breasts.  She endorsed a widespread myopathy as well as neuropathy in the context of SLE and chronic steroid use.  STN noted to be 31 cm on the right, 34 cm on the left.  Mammograms up-to-date and negative.  Discussed need for drains postoperatively x 1 day.  Estimated excess breast tissue removed at time of surgery equals 300 to 400 g each side.  Risks of surgery discussed.  Patient was understanding and agreeable to the plan.  PMH Significant for: Macromastia, SLE, ILD, generalized myopathy and chronic pain syndrome on twice daily tramadol, dysphagia, CKD most recent GFR 44, neuropathy.   Past Medical History: Allergies: No Known Allergies  Current Medications:  Current Outpatient Medications:    amLODipine (NORVASC) 5 MG tablet, Take 5 mg by mouth daily., Disp: , Rfl:    benazepril-hydrochlorthiazide (LOTENSIN HCT) 20-12.5 MG tablet, , Disp: , Rfl:    betamethasone dipropionate 0.05 % lotion, Apply topically 2 (two) times daily., Disp: , Rfl:  Biotin 5000 MCG CAPS, Take by mouth daily., Disp: , Rfl:    calcium-vitamin D (OSCAL WITH D) 500-200 MG-UNIT tablet, Take 1 tablet by mouth., Disp: , Rfl:    Cholecalciferol (VITAMIN D3 PO), Take 2,000 Int'l Units by mouth daily., Disp: , Rfl:    clobetasol (TEMOVATE) 0.05 % external solution, Apply 1 application topically 2 (two) times daily., Disp: , Rfl:    co-enzyme Q-10 30 MG capsule, Take 30 mg by mouth daily., Disp: , Rfl:    Darbepoetin Alfa (ARANESP) 100 MCG/0.5ML SOSY injection, Inject 200 mcg into the skin  every 8 (eight) weeks. , Disp: , Rfl:    Darbepoetin Alfa (ARANESP) 200 MCG/0.4ML SOSY injection, Inject 200 mcg into the skin every 8 (eight) weeks. , Disp: , Rfl:    Darbepoetin Alfa (ARANESP) 40 MCG/0.4ML SOSY injection, Inject 50 mcg into the skin once., Disp: , Rfl:    Darbepoetin Alfa (ARANESP) 60 MCG/0.3ML SOSY injection, Inject 50 mcg into the skin., Disp: , Rfl:    diphenhydrAMINE (BENADRYL) 25 mg capsule, Take 25 mg by mouth every 6 (six) hours as needed (one or two tabs)., Disp: , Rfl:    DULoxetine (CYMBALTA) 60 MG capsule, Take 60 mg by mouth daily., Disp: , Rfl:    epoetin alfa-epbx (RETACRIT) 86578 UNIT/ML injection, 20,000 Units every 30 (thirty) days., Disp: , Rfl:    Ferrous Sulfate (IRON) 325 (65 Fe) MG TABS, Take by mouth daily., Disp: , Rfl:    furosemide (LASIX) 40 MG tablet, Take 40 mg by mouth., Disp: , Rfl:    gabapentin (NEURONTIN) 300 MG capsule, '600mg'$  qam, '300mg'$  at noon, '600mg'$  at night, Disp: , Rfl:    gabapentin (NEURONTIN) 800 MG tablet, Take 800 mg by mouth 3 (three) times daily. , Disp: , Rfl:    halobetasol (ULTRAVATE) 0.05 % cream, Apply topically 2 (two) times daily., Disp: , Rfl:    hydroxychloroquine (PLAQUENIL) 200 MG tablet, Take by mouth 2 (two) times daily., Disp: , Rfl:    ibuprofen (ADVIL) 800 MG tablet, Take 1 tablet (800 mg total) by mouth every 6 (six) hours as needed., Disp: 60 tablet, Rfl: 1   loratadine (CLARITIN) 10 MG tablet, Take 10 mg by mouth daily., Disp: , Rfl:    magnesium oxide (MAG-OX) 400 MG tablet, Take 400 mg by mouth daily., Disp: , Rfl:    METHOTREXATE PO, Take by mouth., Disp: , Rfl:    MISC NATURAL PRODUCTS PO, Take 2 tablets by mouth daily., Disp: , Rfl:    montelukast (SINGULAIR) 10 MG tablet, Take 10 mg by mouth at bedtime., Disp: , Rfl:    mycophenolate (CELLCEPT) 500 MG tablet, Take by mouth 3 (three) times daily., Disp: , Rfl:    Omega 3-6-9 Fatty Acids (TRIPLE OMEGA COMPLEX PO), '500mg'$  krill, '500mg'$  fish oil, 500iu vitamin D -  Take two tablets daily., Disp: , Rfl:    Omega-3 Fatty Acids (OMEGA-3 FISH OIL PO), Take one capsule daily., Disp: , Rfl:    omeprazole (PRILOSEC) 40 MG capsule, Take 40 mg by mouth daily., Disp: , Rfl:    oxyCODONE-acetaminophen (PERCOCET) 5-325 MG tablet, Take 1 tablet by mouth every 4 (four) hours as needed for severe pain., Disp: 30 tablet, Rfl: 0   pantoprazole (PROTONIX) 40 MG tablet, Take 40 mg by mouth every other day., Disp: , Rfl:    predniSONE (DELTASONE) 5 MG tablet, Take 7 mg by mouth daily with breakfast. , Disp: , Rfl:    RABEprazole (ACIPHEX) 20 MG  tablet, Take 20 mg by mouth daily., Disp: , Rfl:    sertraline (ZOLOFT) 25 MG tablet, Take 25 mg by mouth daily., Disp: , Rfl:    traMADol (ULTRAM) 50 MG tablet, Take 50 mg by mouth 3 (three) times daily as needed., Disp: , Rfl:    triamcinolone cream (KENALOG) 0.1 %, Apply 1 application. topically 3 (three) times daily., Disp: , Rfl:    vitamin B-12 (CYANOCOBALAMIN) 1000 MCG tablet, Take 1,000 mcg by mouth daily., Disp: , Rfl:   Past Medical Problems: Past Medical History:  Diagnosis Date   Anemia    Asthma    Chronic kidney disease    Cough variant asthma    Diabetes mellitus, type II ([place])    Hammer toe    Uterine fibroid     Past Surgical History: Past Surgical History:  Procedure Laterality Date   SALIVARY GLAND SURGERY     UTERINE FIBROID SURGERY      Social History: Social History   Socioeconomic History   Marital status: Single    Spouse name: Not on file   Number of children: 0   Years of education: Not on file   Highest education level: Not on file  Occupational History   Occupation: Unemployed  Tobacco Use   Smoking status: Former    Types: Cigarettes    Quit date: 01/31/1986    Years since quitting: 35.9   Smokeless tobacco: Never   Tobacco comments:    pt unsure of how long or how much she smoked betofre quitting.   Vaping Use   Vaping Use: Not on file  Substance and Sexual Activity    Alcohol use: No    Alcohol/week: 0.0 standard drinks of alcohol   Drug use: No   Sexual activity: Not on file  Other Topics Concern   Not on file  Social History Narrative   Lives at home alone.   Right-handed.   No caffeine use.   Social Determinants of Health   Financial Resource Strain: Not on file  Food Insecurity: Not on file  Transportation Needs: Not on file  Physical Activity: Not on file  Stress: Not on file  Social Connections: Not on file  Intimate Partner Violence: Not on file    Family History: Family History  Problem Relation Age of Onset   Healthy Mother     Review of Systems: ROS  Physical Exam: Vital Signs BP (!) 155/83 (BP Location: Left Arm, Patient Position: Sitting, Cuff Size: Normal)   Pulse 74   Ht '5\' 4"'$  (1.626 m)   Wt 157 lb (71.2 kg)   SpO2 99%   BMI 26.95 kg/m   Physical Exam  Constitutional:      General: Not in acute distress.    Appearance: Normal appearance. Not ill-appearing.  HENT:     Head: Normocephalic and atraumatic.  Eyes:     Pupils: Pupils are equal, round. Cardiovascular:     Rate and Rhythm: Normal rate.    Pulses: Normal pulses.  Pulmonary:     Effort: No respiratory distress or increased work of breathing.  Speaks in full sentences. Abdominal:     General: Abdomen is flat. No distension.   Musculoskeletal: Normal range of motion. No lower extremity swelling or edema. No varicosities.  Skin:    General: Skin is warm and dry.     Findings: No erythema or rash.  Neurological:     Mental Status: Alert and oriented to person, place, and time.  Psychiatric:  Mood and Affect: Mood normal.        Behavior: Behavior normal.    Assessment/Plan: The patient is scheduled for breast reduction with Dr.  Lovena Le .  Risks, benefits, and alternatives of procedure discussed, questions answered and consent obtained.    Smoking Status: Non-smoker. Last Mammogram: 05/2021; Results: BI-RADS Category 1:  Negative.  Caprini Score: 7; Risk Factors include: Age, BMI greater than 25, history of DVT in mother, and length of planned surgery. Recommendation for mechanical and possibly pharmacological prophylaxis.  Will discuss with Dr. Lovena Le.  Encourage early ambulation.   Pictures obtained: 12/05/2021.  Post-op Rx sent to pharmacy: None at this time as we are going to have to cancel and reschedule.  Patient was provided with the General Surgical Risk consent document and Pain Medication Agreement prior to their appointment.  They had adequate time to read through the risk consent documents and Pain Medication Agreement. We also discussed them in person together during this preop appointment. All of their questions were answered to their satisfaction.  Recommended calling if they have any further questions.  Risk consent form and Pain Medication Agreement to be scanned into patient's chart.  The risk that can be encountered with breast reduction were discussed and include the following but not limited to these:  Breast asymmetry, fluid accumulation, firmness of the breast, inability to breast feed, loss of nipple or areola, skin loss, decrease or no nipple sensation, fat necrosis of the breast tissue, bleeding, infection, healing delay.  There are risks of anesthesia, changes to skin sensation and injury to nerves or blood vessels.  The muscle can be temporarily or permanently injured.  You may have an allergic reaction to tape, suture, glue, blood products which can result in skin discoloration, swelling, pain, skin lesions, poor healing.  Any of these can lead to the need for revisonal surgery or stage procedures.  A reduction has potential to interfere with diagnostic procedures.  Nipple or breast piercing can increase risks of infection.  This procedure is best done when the breast is fully developed.  Changes in the breast will continue to occur over time.  Pregnancy can alter the outcomes of previous  breast reduction surgery, weight gain and weigh loss can also effect the long term appearance.   Electronically signed by: Krista Blue, PA-C 01/18/2022 4:17 PM

## 2022-01-19 ENCOUNTER — Telehealth: Payer: Self-pay | Admitting: Physician Assistant

## 2022-01-19 NOTE — Telephone Encounter (Signed)
Faxed for surgical clearance to Dr. Kara Mead, Bernardo Heater, NP and Dr. Georga Bora

## 2022-01-19 NOTE — Telephone Encounter (Signed)
Thank you, Alesha, we will send clearance to her soon-to-be pulmonologist Dr. Elsworth Soho

## 2022-01-19 NOTE — Telephone Encounter (Signed)
Pt called to let you know that she has an apt. at [facility] on Dec 7.

## 2022-01-20 ENCOUNTER — Telehealth: Payer: Self-pay | Admitting: Physician Assistant

## 2022-01-20 NOTE — Telephone Encounter (Signed)
error 

## 2022-01-23 ENCOUNTER — Telehealth: Payer: Self-pay | Admitting: Plastic Surgery

## 2022-01-23 DIAGNOSIS — R1319 Other dysphagia: Secondary | ICD-10-CM | POA: Diagnosis not present

## 2022-01-23 DIAGNOSIS — K220 Achalasia of cardia: Secondary | ICD-10-CM | POA: Diagnosis not present

## 2022-01-23 NOTE — Telephone Encounter (Signed)
Patient called to let us know that the fax we sent to Dr. Manuella Ghazi was wrong. She provided [place] with a new number [phone], the paperwork is to Attention: Gwinda Passe, the patient stated. Patient stated that Gwinda Passe said you can mark it urgent.

## 2022-01-23 NOTE — Telephone Encounter (Signed)
Faxed surgery clearance to Dr. Manuella Ghazi [phone]

## 2022-01-24 ENCOUNTER — Telehealth: Payer: Self-pay | Admitting: Pulmonary Disease

## 2022-01-24 ENCOUNTER — Encounter (HOSPITAL_COMMUNITY): Admission: RE | Payer: Self-pay | Source: Home / Self Care

## 2022-01-24 ENCOUNTER — Ambulatory Visit (HOSPITAL_COMMUNITY): Admission: RE | Admit: 2022-01-24 | Payer: Medicare HMO | Source: Home / Self Care | Admitting: Plastic Surgery

## 2022-01-24 SURGERY — BREAST REDUCTION WITH LIPOSUCTION
Anesthesia: General | Site: Breast | Laterality: Bilateral

## 2022-01-24 NOTE — Telephone Encounter (Signed)
Fax received from Dr. Drema Pry with [facility] to perform a Bilateral breast Reduction  on patient.  Patient needs surgery clearance. Surgery is TBD. Patient was seen on N/A. Office protocol is a risk assessment can be sent to surgeon if patient has been seen in 60 days or less.   Sending to Dr Elsworth Soho for risk assessment or recommendations if patient needs to be seen in office prior to surgical procedure.    Dr Elsworth Soho sir you have a consult with this patient on 01/26/2022

## 2022-01-25 ENCOUNTER — Encounter: Payer: Medicare HMO | Admitting: Physician Assistant

## 2022-01-26 ENCOUNTER — Institutional Professional Consult (permissible substitution) (HOSPITAL_BASED_OUTPATIENT_CLINIC_OR_DEPARTMENT_OTHER): Payer: Medicare HMO | Admitting: Pulmonary Disease

## 2022-01-27 ENCOUNTER — Telehealth (INDEPENDENT_AMBULATORY_CARE_PROVIDER_SITE_OTHER): Payer: Self-pay | Admitting: Physician Assistant

## 2022-01-27 ENCOUNTER — Telehealth: Payer: Self-pay | Admitting: Physician Assistant

## 2022-01-27 DIAGNOSIS — Z719 Counseling, unspecified: Secondary | ICD-10-CM

## 2022-01-27 NOTE — Telephone Encounter (Signed)
Patient called returning Herbert Spires call and would like a call back.

## 2022-01-27 NOTE — Telephone Encounter (Signed)
Garrett spoke with patient °

## 2022-01-27 NOTE — Telephone Encounter (Signed)
Called and spoke with patient.

## 2022-01-27 NOTE — Telephone Encounter (Signed)
Called patient to notify her that she would be cancelled for her upcoming surgery.  We received the surgical clearance from her rheumatologist and unfortunately she would need to continue prednisone perioperatively in context of her autoimmune disease.  Dr. Lovena Le cancelled the surgery for this reason.  She also missed her pulmonary appointment yesterday due to navigational issues but will be seeing him next week.  She was disappointed about the cancellation, but voiced understanding about our unwillingness to put her at increased risk for postoperative complications from an elective surgery.

## 2022-02-02 ENCOUNTER — Encounter: Payer: Medicare HMO | Admitting: Plastic Surgery

## 2022-02-02 ENCOUNTER — Institutional Professional Consult (permissible substitution) (HOSPITAL_BASED_OUTPATIENT_CLINIC_OR_DEPARTMENT_OTHER): Payer: Medicare HMO | Admitting: Pulmonary Disease

## 2022-02-07 DIAGNOSIS — Z1211 Encounter for screening for malignant neoplasm of colon: Secondary | ICD-10-CM | POA: Diagnosis not present

## 2022-02-07 DIAGNOSIS — R1310 Dysphagia, unspecified: Secondary | ICD-10-CM | POA: Diagnosis not present

## 2022-02-07 DIAGNOSIS — R053 Chronic cough: Secondary | ICD-10-CM | POA: Diagnosis not present

## 2022-02-07 DIAGNOSIS — Z8619 Personal history of other infectious and parasitic diseases: Secondary | ICD-10-CM | POA: Diagnosis not present

## 2022-02-07 DIAGNOSIS — R194 Change in bowel habit: Secondary | ICD-10-CM | POA: Diagnosis not present

## 2022-02-09 ENCOUNTER — Ambulatory Visit (INDEPENDENT_AMBULATORY_CARE_PROVIDER_SITE_OTHER): Payer: Medicare HMO | Admitting: Pulmonary Disease

## 2022-02-09 ENCOUNTER — Encounter (HOSPITAL_BASED_OUTPATIENT_CLINIC_OR_DEPARTMENT_OTHER): Payer: Self-pay | Admitting: Pulmonary Disease

## 2022-02-09 VITALS — BP 130/84 | HR 86 | Ht 64.0 in | Wt 155.0 lb

## 2022-02-09 DIAGNOSIS — J849 Interstitial pulmonary disease, unspecified: Secondary | ICD-10-CM

## 2022-02-09 DIAGNOSIS — R0902 Hypoxemia: Secondary | ICD-10-CM | POA: Diagnosis not present

## 2022-02-09 NOTE — Patient Instructions (Addendum)
CT-Interstitial lung disease - stable lung function --ORDER overnight oximetry on room air.  --ORDER echocardiogram to evaluate for possible PH  Peri-operative Assessment of Pulmonary Risk for Non-Thoracic Surgery:   For Ms. Crossland, risk of perioperative pulmonary complications is increased by:             [ X] Age greater than 58 years             '[ ]'$  COPD             '[ ]'$  Serum albumin <3.5             '[ ]'$  Smoking             '[ ]'$  Obstructive sleep apnea             Valu.Nieves ] Suspected NYHA Class II Pulmonary Hypertension   Based on ARISCAT score, patient is INTERMEDIATE RISK 13.3% risk of in-hospital post-op pulmonary complications (composite including respiratory failure, respiratory infection, pleural effusion, atelectasis, pneumothorax, bronchospasm, aspiration pneumonitis)   Respiratory complications generally occur in 1% of ASA Class I patients, 5% of ASA Class II and 10% of ASA Class III-IV patients These complications rarely result in mortality and include postoperative pneumonia, atelectasis, pulmonary embolism, ARDS and increased time requiring postoperative mechanical ventilation.   Overall, I recommend proceeding with the surgery if the risk for respiratory complications are outweighed by the potential benefits. This will need to be discussed between the patient and surgeon.   To reduce risks of respiratory complications, I recommend: --Pre- and post-operative incentive spirometry performed frequently while awake --Avoiding use of pancuronium during anesthesia.   I have discussed the risk factors and recommendations above with the patient.

## 2022-02-09 NOTE — Progress Notes (Signed)
Subjective:   PATIENT ID: Andrea Leblanc GENDER: female DOB: November 05, 1950, MRN: [id number]  Chief Complaint  Patient presents with   Consult    Surgical clearance for rt ankle     Reason for Visit: New consult for pulmonary pre-op   Andrea Leblanc is a 71 year old female former smoker with CT-ILD, hx RA and SLE on chronic prednisone and plaquenil, s/p IVIG 08/2021, HTN who presents pulmonary pre-op.  She was previously seen by Dr. Lake Bells in 2018 and presents as a new consult today. She presents for surgical clearance for right ankle surgery. Surgery date pending. She reports longstanding history of cough for >20 years after working in the [employer] Cough resolved in 2007 after relocating to [place]. Since then she denies shortness of breath, cough or wheezing. Not active at baseline due to her right ankle pain. Followed by [facility] for CT-ILD and combination of reflux.  Social History: Unclear pack-years. 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  I have personally reviewed patient's past medical/family/social history, allergies, current medications.  Past Medical History:  Diagnosis Date   Anemia    Asthma    Chronic kidney disease    Cough variant asthma    Diabetes mellitus, type II ([place])    Hammer toe    Uterine fibroid      Family History  Problem Relation Age of Onset   Healthy Mother      Social History   Occupational History   Occupation: Unemployed  Tobacco Use   Smoking status: Former    Types: Cigarettes    Quit date: 01/31/1986    Years since quitting: 36.0   Smokeless tobacco: Never   Tobacco comments:    pt unsure of how long or how much she smoked betofre quitting.   Vaping Use   Vaping Use: Not on file  Substance and Sexual Activity   Alcohol use: No    Alcohol/week: 0.0 standard drinks of alcohol   Drug use: No   Sexual activity: Not on file    No  Known Allergies   Outpatient Medications Prior to Visit  Medication Sig Dispense Refill   amLODipine (NORVASC) 5 MG tablet Take 5 mg by mouth daily.     benazepril-hydrochlorthiazide (LOTENSIN HCT) 20-12.5 MG tablet      betamethasone dipropionate 0.05 % lotion Apply topically 2 (two) times daily.     Biotin 5000 MCG CAPS Take by mouth daily.     calcium-vitamin D (OSCAL WITH D) 500-200 MG-UNIT tablet Take 1 tablet by mouth.     Cholecalciferol (VITAMIN D3 PO) Take 2,000 Int'l Units by mouth daily.     clobetasol (TEMOVATE) 0.05 % external solution Apply 1 application topically 2 (two) times daily.     co-enzyme Q-10 30 MG capsule Take 30 mg by mouth daily.     Darbepoetin Alfa (ARANESP) 100 MCG/0.5ML SOSY injection Inject 200 mcg into the skin every 8 (eight) weeks.      Darbepoetin Alfa (ARANESP) 200 MCG/0.4ML SOSY injection Inject 200 mcg into the skin every 8 (eight) weeks.      Darbepoetin Alfa (ARANESP) 40 MCG/0.4ML SOSY injection Inject 50 mcg into the skin once.     Darbepoetin Alfa (ARANESP) 60 MCG/0.3ML SOSY injection Inject 50 mcg into the skin.     diphenhydrAMINE (BENADRYL) 25 mg capsule Take 25 mg by mouth every 6 (six) hours as needed (one or two  tabs).     DULoxetine (CYMBALTA) 60 MG capsule Take 60 mg by mouth daily.     epoetin alfa-epbx (RETACRIT) 13086 UNIT/ML injection 20,000 Units every 30 (thirty) days.     Ferrous Sulfate (IRON) 325 (65 Fe) MG TABS Take by mouth daily.     furosemide (LASIX) 40 MG tablet Take 40 mg by mouth.     gabapentin (NEURONTIN) 300 MG capsule '600mg'$  qam, '300mg'$  at noon, '600mg'$  at night     gabapentin (NEURONTIN) 800 MG tablet Take 800 mg by mouth 3 (three) times daily.      halobetasol (ULTRAVATE) 0.05 % cream Apply topically 2 (two) times daily.     hydroxychloroquine (PLAQUENIL) 200 MG tablet Take by mouth 2 (two) times daily.     ibuprofen (ADVIL) 800 MG tablet Take 1 tablet (800 mg total) by mouth every 6 (six) hours as needed. 60 tablet 1    loratadine (CLARITIN) 10 MG tablet Take 10 mg by mouth daily.     magnesium oxide (MAG-OX) 400 MG tablet Take 400 mg by mouth daily.     METHOTREXATE PO Take by mouth.     MISC NATURAL PRODUCTS PO Take 2 tablets by mouth daily.     montelukast (SINGULAIR) 10 MG tablet Take 10 mg by mouth at bedtime.     mycophenolate (CELLCEPT) 500 MG tablet Take by mouth 3 (three) times daily.     Omega 3-6-9 Fatty Acids (TRIPLE OMEGA COMPLEX PO) '500mg'$  krill, '500mg'$  fish oil, 500iu vitamin D - Take two tablets daily.     Omega-3 Fatty Acids (OMEGA-3 FISH OIL PO) Take one capsule daily.     omeprazole (PRILOSEC) 40 MG capsule Take 40 mg by mouth daily.     oxyCODONE-acetaminophen (PERCOCET) 5-325 MG tablet Take 1 tablet by mouth every 4 (four) hours as needed for severe pain. 30 tablet 0   pantoprazole (PROTONIX) 40 MG tablet Take 40 mg by mouth every other day.     predniSONE (DELTASONE) 5 MG tablet Take 7 mg by mouth daily with breakfast.      RABEprazole (ACIPHEX) 20 MG tablet Take 20 mg by mouth daily.     sertraline (ZOLOFT) 25 MG tablet Take 25 mg by mouth daily.     traMADol (ULTRAM) 50 MG tablet Take 50 mg by mouth 3 (three) times daily as needed.     triamcinolone cream (KENALOG) 0.1 % Apply 1 application. topically 3 (three) times daily.     vitamin B-12 (CYANOCOBALAMIN) 1000 MCG tablet Take 1,000 mcg by mouth daily.     No facility-administered medications prior to visit.    Review of Systems  Constitutional:  Negative for chills, diaphoresis, fever, malaise/fatigue and weight loss.  HENT:  Negative for congestion.   Respiratory:  Negative for cough, hemoptysis, sputum production, shortness of breath and wheezing.   Cardiovascular:  Negative for chest pain, palpitations and leg swelling.  Musculoskeletal:  Positive for joint pain.     Objective:   Vitals:   02/09/22 1327  BP: 130/84  Pulse: 86  SpO2: 98%  Weight: 155 lb (70.3 kg)  Height: '5\' 4"'$  (1.626 m)   SpO2: 98 % O2 Device: None  (Room air)  Physical Exam: General: Well-appearing, no acute distress HENT: [place], AT Eyes: EOMI, no scleral icterus Respiratory: Bibasilar inspiratory crackles. No wheezing Cardiovascular: RRR, -M/R/G, no JVD Extremities:-Edema,-tenderness Neuro: AAO x4, CNII-XII grossly intact Psych: Normal mood, normal affect  Data Reviewed:  Imaging: CT Chest 03/10/16 - Subpleural reticulation and  septal thickening increased in bibasilar region with honeycombing and bronchiectasis. Consistent with UIP. Small pericardial effusion  CT Chest HR 04/14/21 [facility] (report only) -  Redemonstrated moderate pulmonary fibrosis in a pattern with  apical to basal gradient, featuring irregular peripheral  interstitial opacity, septal thickening, traction bronchiectasis,  subpleural bronchiolectasis, and extensive areas of honeycombing.  Fibrotic findings may be minimally worsened compared to prior  examination dated 03/13/2019, however are clearly worsened compared  to more remote prior dated 03/10/2016. Findings remain consistent  with UIP per consensus guidelines, and are in keeping with collagen  vascular disorder associated fibrosis:   PFT: 03/10/16 FVC 2.13 (64%) FEV1 1.81 (71%) Ratio 83  TLC 70% DLCO 35%. No significant bronchodilator response Interpretation: Mild restrictive defect with severely reduced gas exchange  07/25/21 [facility] FVC 1.98 (77%) FEV1 1.73 (86%) Ratio 87  TLC 65% DLCO 55% Interpretation: Mild restrictive defect with moderately reduced gas exchange  Labs: CBC    Component Value Date/Time   WBC 5.1 09/02/2020 1446   RBC 3.67 (L) 09/02/2020 1446   HGB 11.2 (L) 01/17/2022 1426   HCT 38.3 09/02/2020 1446   PLT 195 09/02/2020 1446   MCV 104.4 (H) 09/02/2020 1446   MCH 31.6 09/02/2020 1446   MCHC 30.3 09/02/2020 1446   RDW 13.2 09/02/2020 1446   LYMPHSABS 0.5 (L) 10/23/2019 1445   MONOABS 0.2 10/23/2019 1445   EOSABS 0.0 10/23/2019 1445   BASOSABS 0.0 10/23/2019 1445   Absolute  eos 10/23/19 - 0     Assessment & Plan:   Discussion: 71 year old female former smoker with CT-ILD, hx RA and SLE on chronic prednisone and plaquenil, s/p IVIG 08/2021, HTN who presents pulmonary pre-op. Denies respiratory symptoms however minimally ambulatory. Ambulatory walk with desaturation to 84% with recovery <1 min to >90% with rest. No continuous daytime oxygen needed at rest but consider oxygen for exertional hypoxemia  CT-ILD - stable lung function on PFTs and reportedly stable imaging since 02/2019 Exertional hypoxemia - patient declined oxygen since minimally ambulatory at this time --Reviewed CT 03/10/16 personally with findings consistent with UIP. CT report 04/14/21 commenting on worsened findings compared to 2018 but minimally worsened compared to 03/13/19, neither images available for me to review. --Reviewed PFTs which are overall stable with mild restrictive and improved DLCO to moderate severity --ORDER overnight oximetry on room air.  --ORDER echocardiogram to evaluate for possible PH   Peri-operative Assessment of Pulmonary Risk for Non-Thoracic Surgery:  For Ms. Halabi, risk of perioperative pulmonary complications is increased by:  [ X] Age greater than 39 years  '[ ]'$  COPD  '[ ]'$  Serum albumin <3.5  '[ ]'$  Smoking  '[ ]'$  Obstructive sleep apnea  Valu.Nieves ] Suspected NYHA Class II Pulmonary Hypertension  Based on ARISCAT score, patient is INTERMEDIATE RISK 13.3% risk of in-hospital post-op pulmonary complications (composite including respiratory failure, respiratory infection, pleural effusion, atelectasis, pneumothorax, bronchospasm, aspiration pneumonitis)  Respiratory complications generally occur in 1% of ASA Class I patients, 5% of ASA Class II and 10% of ASA Class III-IV patients These complications rarely result in mortality and include postoperative pneumonia, atelectasis, pulmonary embolism, ARDS and increased time requiring postoperative mechanical ventilation.  Overall, I  recommend proceeding with the surgery if the risk for respiratory complications are outweighed by the potential benefits. This will need to be discussed between the patient and surgeon.  To reduce risks of respiratory complications, I recommend: --Pre- and post-operative incentive spirometry performed frequently while awake --Avoiding use of pancuronium during  anesthesia.  I have discussed the risk factors and recommendations above with the patient.  Health Maintenance Immunization History  Administered Date(s) Administered   PFIZER(Purple Top)SARS-COV-2 Vaccination 04/20/2019, 05/19/2019   Pneumococcal Conjugate-13 05/01/2017   Pneumococcal Polysaccharide-23 02/19/2018   CT Lung Screen - not qualified. Quit >15 years ago. Insufficient tobacco history  Orders Placed This Encounter  Procedures   Pulse oximetry, overnight    Standing Status:   Future    Standing Expiration Date:   02/10/2023    Scheduling Instructions:     Joselyn Arrow on room air   ECHOCARDIOGRAM COMPLETE    Standing Status:   Future    Standing Expiration Date:   02/10/2023    Scheduling Instructions:     Patient requesting for test to be completed before the end of the year    Order Specific Question:   Where should this test be performed    Answer:   [facility]    Order Specific Question:   Perflutren DEFINITY (image enhancing agent) should be administered unless hypersensitivity or allergy exist    Answer:   Administer Perflutren    Order Specific Question:   Is a special reader required? (athlete or structural heart)    Answer:   No    Order Specific Question:   Does this study need to be read by the Structural team/Level 3 readers?    Answer:   No    Order Specific Question:   Reason for exam-Echo    Answer:   Other-Full Diagnosis List    Order Specific Question:   Full ICD-10/Reason for Exam    Answer:   Hypoxemia [799.02.ICD-9-CM]  No orders of the defined types were placed in this  encounter.   Return in about 1 month (around 03/12/2022).  I have spent a total time of 45-minutes on the day of the appointment reviewing prior documentation, coordinating care and discussing medical diagnosis and plan with the patient/family. Imaging, labs and tests included in this note have been reviewed and interpreted independently by me.  [place], MD [facility] 02/09/2022 1:39 PM  Office Number [phone]

## 2022-02-09 NOTE — Telephone Encounter (Signed)
OV notes and clearance form have been faxed back to [facility]. Nothing further needed at this time.

## 2022-02-10 ENCOUNTER — Telehealth (HOSPITAL_BASED_OUTPATIENT_CLINIC_OR_DEPARTMENT_OTHER): Payer: Self-pay | Admitting: *Deleted

## 2022-02-10 NOTE — Telephone Encounter (Signed)
In basket message sent to Vella Kohler at [facility] for insurance prior authorization on the Echocardiogram ordered by Dr. Loanne Drilling

## 2022-02-14 NOTE — Telephone Encounter (Signed)
Left message for patient to call and discuss scheduling the Echocardiogram ordered by Dr. Loanne Drilling

## 2022-02-15 ENCOUNTER — Encounter: Payer: Medicare HMO | Admitting: Physician Assistant

## 2022-02-16 ENCOUNTER — Ambulatory Visit (INDEPENDENT_AMBULATORY_CARE_PROVIDER_SITE_OTHER): Payer: Medicare HMO

## 2022-02-16 ENCOUNTER — Other Ambulatory Visit: Payer: Self-pay

## 2022-02-16 DIAGNOSIS — D631 Anemia in chronic kidney disease: Secondary | ICD-10-CM | POA: Insufficient documentation

## 2022-02-16 DIAGNOSIS — R0902 Hypoxemia: Secondary | ICD-10-CM

## 2022-02-16 LAB — ECHOCARDIOGRAM COMPLETE
Area-P 1/2: 3.17 cm2
MV M vel: 5.22 m/s
MV Peak grad: 108.8 mmHg
S' Lateral: 2.73 cm

## 2022-02-17 ENCOUNTER — Encounter: Payer: Self-pay | Admitting: Podiatry

## 2022-02-21 ENCOUNTER — Encounter (HOSPITAL_BASED_OUTPATIENT_CLINIC_OR_DEPARTMENT_OTHER): Payer: Self-pay | Admitting: Pulmonary Disease

## 2022-02-23 NOTE — Telephone Encounter (Signed)
Dr. Loanne Drilling, please see pt's message regarding her recent sx risk assessment OV. Thanks.

## 2022-02-24 ENCOUNTER — Encounter (HOSPITAL_BASED_OUTPATIENT_CLINIC_OR_DEPARTMENT_OTHER): Payer: Self-pay | Admitting: Pulmonary Disease

## 2022-02-24 DIAGNOSIS — R0683 Snoring: Secondary | ICD-10-CM | POA: Diagnosis not present

## 2022-02-24 DIAGNOSIS — L853 Xerosis cutis: Secondary | ICD-10-CM | POA: Diagnosis not present

## 2022-02-24 DIAGNOSIS — Z299 Encounter for prophylactic measures, unspecified: Secondary | ICD-10-CM | POA: Diagnosis not present

## 2022-02-24 DIAGNOSIS — Z7952 Long term (current) use of systemic steroids: Secondary | ICD-10-CM

## 2022-02-24 DIAGNOSIS — G4730 Sleep apnea, unspecified: Secondary | ICD-10-CM | POA: Diagnosis not present

## 2022-02-24 DIAGNOSIS — L930 Discoid lupus erythematosus: Secondary | ICD-10-CM | POA: Diagnosis not present

## 2022-02-24 DIAGNOSIS — H04123 Dry eye syndrome of bilateral lacrimal glands: Secondary | ICD-10-CM | POA: Diagnosis not present

## 2022-02-24 DIAGNOSIS — M069 Rheumatoid arthritis, unspecified: Secondary | ICD-10-CM | POA: Diagnosis not present

## 2022-02-24 NOTE — Telephone Encounter (Signed)
Called patient back. Patient agrees to Endocrine referral. Also will await my discussion with her Rheumatologist.

## 2022-02-24 NOTE — Telephone Encounter (Signed)
Mychart message sent by pt: Andrea Leblanc  P [facility] Clinical Pool (supporting Chi Rodman Pickle, MD)1 hour ago (12:59 PM)    Question from Dr. Manuella Ghazi [facility] - " please ask what her thoughts are on if you have interstitial lung disease and if that needs to be treated or evaluated given your history of lupus."   Thank you.    Dr. Loanne Drilling, please advise.

## 2022-02-24 NOTE — Telephone Encounter (Signed)
Pt returning [facility] call.

## 2022-02-24 NOTE — Telephone Encounter (Signed)
Dr. Loanne Drilling, please also see pt's most recent message regarding an endocrinology referral, in addition to her original message. Thanks.

## 2022-02-28 ENCOUNTER — Encounter (HOSPITAL_COMMUNITY): Payer: Medicare HMO

## 2022-03-01 ENCOUNTER — Encounter (HOSPITAL_COMMUNITY)
Admission: RE | Admit: 2022-03-01 | Discharge: 2022-03-01 | Disposition: A | Discharge status: Home / Self Care | Payer: Medicare HMO | Source: Ambulatory Visit | Attending: Internal Medicine | Admitting: Internal Medicine

## 2022-03-01 VITALS — BP 139/81 | HR 86 | Temp 97.8°F | Resp 16

## 2022-03-01 DIAGNOSIS — N183 Chronic kidney disease, stage 3 unspecified: Secondary | ICD-10-CM | POA: Insufficient documentation

## 2022-03-01 DIAGNOSIS — D631 Anemia in chronic kidney disease: Secondary | ICD-10-CM | POA: Diagnosis not present

## 2022-03-01 LAB — POCT HEMOGLOBIN-HEMACUE: Hemoglobin: 11.6 g/dL — ABNORMAL LOW (ref 12.0–15.0)

## 2022-03-01 LAB — IRON AND TIBC
Iron: 53 ug/dL (ref 28–170)
Saturation Ratios: 20 % (ref 10.4–31.8)
TIBC: 260 ug/dL (ref 250–450)
UIBC: 207 ug/dL

## 2022-03-01 LAB — FERRITIN: Ferritin: 465 ng/mL — ABNORMAL HIGH (ref 11–307)

## 2022-03-01 MED ORDER — DARBEPOETIN ALFA 60 MCG/0.3ML IJ SOSY
50.0000 ug | PREFILLED_SYRINGE | Freq: Once | INTRAMUSCULAR | Status: AC
  Administered 2022-03-01: 50 ug via SUBCUTANEOUS

## 2022-03-01 MED ORDER — CLONIDINE HCL 0.1 MG PO TABS: 0.1000 mg | ORAL_TABLET | ORAL | Status: DC | PRN

## 2022-03-01 NOTE — Progress Notes (Signed)
Diagnosis: Anemia in Chronic Kidney Disease  Provider:   Jannifer Hick, MD  Procedure: Injection  Aranesp (darbepoetin alfa) , Dose: 50 mg , Site: subcutaneous, Number of injections: 1 Hgb 11.6  Post Care: Patient declined observation  Discharge: Condition: Good, Destination: Home . AVS provided to patient.   Performed by:  Baxter Hire, RN

## 2022-03-03 ENCOUNTER — Encounter (HOSPITAL_BASED_OUTPATIENT_CLINIC_OR_DEPARTMENT_OTHER): Payer: Self-pay | Admitting: Pulmonary Disease

## 2022-03-03 ENCOUNTER — Ambulatory Visit (INDEPENDENT_AMBULATORY_CARE_PROVIDER_SITE_OTHER): Payer: Medicare HMO | Admitting: Neurology

## 2022-03-03 ENCOUNTER — Encounter: Payer: Self-pay | Admitting: Neurology

## 2022-03-03 VITALS — BP 120/76 | HR 109 | Ht 64.0 in | Wt 153.0 lb

## 2022-03-03 DIAGNOSIS — G629 Polyneuropathy, unspecified: Secondary | ICD-10-CM

## 2022-03-03 NOTE — Telephone Encounter (Signed)
Please request results of ONO from DME

## 2022-03-03 NOTE — Progress Notes (Unsigned)
[facility] Clinic Note - Initial Visit   Date: 03/03/2022   Andrea Leblanc MRN: [id number] DOB: 1950/11/19   Dear Dr. Manuella Ghazi:  Thank you for your kind referral of Andrea Leblanc for consultation of neuropathy. Although her history is well known to you, please allow [place] to reiterate it for the purpose of our medical record. The patient was accompanied to the clinic by self.   Andrea Leblanc is a 72 y.o. female with lupus and hypertension presenting for evaluation of neuropathy.   IMPRESSION/PLAN: Small fiber neuropathy, most likely related to lupus.  I had extensive discussion with the patient regarding the pathogenesis, etiology, management, and natural course of neuropathy. Neuropathy tends to be slowly progressive, especially if a treatable etiology is not identified and management is symptomatic.  Given no benefit with IVIG, I do not recommend any additional infusions.  Patient is seeking to find a "fix" for neuropathy, which unfortunately is not available.  I explained that her neuropathy symptoms will be indefinite and may actually progress over time.  Management is supportive.  PT for offered, she will think about it.  I do not see any role in repeating NCS/EMG as it would not change management. Continue gabapentin '800mg'$  TID and Cymbalta '60mg'$ /d for pain.    Return to clinic as needed   ------------------------------------------------------------- History of present illness: In 2017, she was hospitalized with pneumonia and deconditioning and discharged to rehab.  Ultimately, she was discharged home with a walker/cane. Since then, she has bilateral leg weakness and has had a few falls. She was doing well and able to walk without assistance until 2023.  She has not had any falls in the past year.  She has not done any recent PT. She lives alone and has 5-steps entering her one-level home.   She complains of constant numbness involving the toes and feet  "hurts", described as a bruise.  She denies pins-and-needle sensation.  She currently takes gabapentin '800mg'$  three times daily and cymbalta '60mg'$ .  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 [place] myopathy panel shows antibodies to TS HDS and FGFR3 and given her small fiber neuropathy, she was offered a trial of IVIG from July - November 2023 which she stopped because of no benefit. She had spinal cord stimulator but it did not help.  She also takes prednisone '7mg'$  which is prescribed by rheuatmology for lupus.   Out-side paper records, electronic medical record, and images have been reviewed where available and summarized as:  Lab Results  Component Value Date   HGBA1C 6.0 09/20/2012   Lab Results  Component Value Date   HERDEYCX44 818 (H) 12/10/2018   Lab Results  Component Value Date   TSH 0.954 08/24/2009   Lab Results  Component Value Date   ESRSEDRATE 85 (H) 10/23/2019    Past Medical History:  Diagnosis Date   Anemia    Asthma    Chronic kidney disease    Cough variant asthma    Diabetes mellitus, type II ([place])    Hammer toe    Uterine fibroid     Past Surgical History:  Procedure Laterality Date   SALIVARY GLAND SURGERY     UTERINE FIBROID SURGERY       Medications:  Outpatient Encounter Medications as of 03/03/2022  Medication Sig Note   amLODipine (NORVASC) 5 MG tablet  Take 5 mg by mouth daily.    calcium-vitamin D (OSCAL WITH D) 500-200 MG-UNIT tablet Take 1 tablet by mouth.    Darbepoetin Alfa (ARANESP) 100 MCG/0.5ML SOSY injection Inject 200 mcg into the skin every 8 (eight) weeks.     Darbepoetin Alfa (ARANESP) 200 MCG/0.4ML SOSY injection Inject 200 mcg into the skin every 8 (eight) weeks.     Darbepoetin Alfa (ARANESP) 40 MCG/0.4ML SOSY injection Inject 50 mcg into the skin once.    Darbepoetin  Alfa (ARANESP) 60 MCG/0.3ML SOSY injection Inject 50 mcg into the skin.    DULoxetine (CYMBALTA) 60 MG capsule Take 60 mg by mouth daily.    furosemide (LASIX) 40 MG tablet Take 40 mg by mouth.    gabapentin (NEURONTIN) 800 MG tablet Take 800 mg by mouth 3 (three) times daily.     hydroxychloroquine (PLAQUENIL) 200 MG tablet Take by mouth 2 (two) times daily.    mycophenolate (CELLCEPT) 500 MG tablet Take by mouth 3 (three) times daily.    Omega 3-6-9 Fatty Acids (TRIPLE OMEGA COMPLEX PO) '500mg'$  krill, '500mg'$  fish oil, 500iu vitamin D - Take two tablets daily.    Omega-3 Fatty Acids (OMEGA-3 FISH OIL PO) Take one capsule daily.    predniSONE (DELTASONE) 5 MG tablet Take 7 mg by mouth daily with breakfast.     triamcinolone cream (KENALOG) 0.1 % Apply 1 application. topically 3 (three) times daily.    Biotin 5000 MCG CAPS Take by mouth daily. (Patient not taking: Reported on 02/09/2022)    diphenhydrAMINE (BENADRYL) 25 mg capsule Take 25 mg by mouth every 6 (six) hours as needed (one or two tabs). (Patient not taking: Reported on 02/09/2022)    epoetin alfa-epbx (RETACRIT) 16606 UNIT/ML injection 20,000 Units every 30 (thirty) days. (Patient not taking: Reported on 02/09/2022)    Ferrous Sulfate (IRON) 325 (65 Fe) MG TABS Take by mouth daily. (Patient not taking: Reported on 02/09/2022)    gabapentin (NEURONTIN) 300 MG capsule '600mg'$  qam, '300mg'$  at noon, '600mg'$  at night (Patient not taking: Reported on 02/09/2022)    halobetasol (ULTRAVATE) 0.05 % cream Apply topically 2 (two) times daily. (Patient not taking: Reported on 02/09/2022)    loratadine (CLARITIN) 10 MG tablet Take 10 mg by mouth daily. (Patient not taking: Reported on 02/09/2022) 03/15/2018: Also known as Alavert   METHOTREXATE PO Take by mouth. (Patient not taking: Reported on 02/09/2022)    MISC NATURAL PRODUCTS PO Take 2 tablets by mouth daily. (Patient not taking: Reported on 02/09/2022)    montelukast (SINGULAIR) 10 MG tablet Take 10 mg  by mouth at bedtime. (Patient not taking: Reported on 02/09/2022)    omeprazole (PRILOSEC) 40 MG capsule Take 40 mg by mouth daily. (Patient not taking: Reported on 02/09/2022)    oxyCODONE-acetaminophen (PERCOCET) 5-325 MG tablet Take 1 tablet by mouth every 4 (four) hours as needed for severe pain. (Patient not taking: Reported on 02/09/2022)    pantoprazole (PROTONIX) 40 MG tablet Take 40 mg by mouth every other day. (Patient not taking: Reported on 02/09/2022)    RABEprazole (ACIPHEX) 20 MG tablet Take 20 mg by mouth daily. (Patient not taking: Reported on 02/09/2022) 07/28/2019: No longer taking   sertraline (ZOLOFT) 25 MG tablet Take 25 mg by mouth daily. (Patient not taking: Reported on 02/09/2022)    traMADol (ULTRAM) 50 MG tablet Take 50 mg by mouth 3 (three) times daily as needed. (Patient not taking: Reported on 02/09/2022)    vitamin B-12 (CYANOCOBALAMIN) 1000 MCG tablet  Take 1,000 mcg by mouth daily. (Patient not taking: Reported on 02/09/2022)    [DISCONTINUED] benazepril-hydrochlorthiazide (LOTENSIN HCT) 20-12.5 MG tablet  (Patient not taking: Reported on 02/09/2022)    [DISCONTINUED] betamethasone dipropionate 0.05 % lotion Apply topically 2 (two) times daily. (Patient not taking: Reported on 02/09/2022)    [DISCONTINUED] Cholecalciferol (VITAMIN D3 PO) Take 2,000 Int'l Units by mouth daily. (Patient not taking: Reported on 02/09/2022)    [DISCONTINUED] clobetasol (TEMOVATE) 0.05 % external solution Apply 1 application topically 2 (two) times daily. (Patient not taking: Reported on 02/09/2022)    [DISCONTINUED] co-enzyme Q-10 30 MG capsule Take 30 mg by mouth daily. (Patient not taking: Reported on 02/09/2022)    [DISCONTINUED] ibuprofen (ADVIL) 800 MG tablet Take 1 tablet (800 mg total) by mouth every 6 (six) hours as needed. (Patient not taking: Reported on 02/09/2022)    [DISCONTINUED] magnesium oxide (MAG-OX) 400 MG tablet Take 400 mg by mouth daily. (Patient not taking: Reported on  02/09/2022)    No facility-administered encounter medications on file as of 03/03/2022.    Allergies:  Allergies  Allergen Reactions   Lyrica [Pregabalin] Swelling    Family History: Family History  Problem Relation Age of Onset   Healthy Mother     Social History: Social History   Tobacco Use   Smoking status: Former    Types: Cigarettes    Quit date: 01/31/1986    Years since quitting: 36.1   Smokeless tobacco: Never   Tobacco comments:    pt unsure of how long or how much she smoked betofre quitting.   Substance Use Topics   Alcohol use: No    Alcohol/week: 0.0 standard drinks of alcohol   Drug use: No   Social History   Social History Narrative   Lives at home alone.   Right-handed.   No caffeine use.   Lives home alone   Lives in a one story home. Five steps lead to the front door.    Vital Signs:  BP 120/76   Pulse (!) 109   Ht '5\' 4"'$  (1.626 m)   Wt 153 lb (69.4 kg)   SpO2 97%   BMI 26.26 kg/m    Neurological Exam: MENTAL STATUS including orientation to time, place, person, recent and remote memory, attention span and concentration, language, and fund of knowledge is normal.  Speech is not dysarthric.  CRANIAL NERVES: II:  No visual field defects.   III-IV-VI: Pupils equal round and reactive to light.  Normal conjugate, extra-ocular eye movements in all directions of gaze.  No nystagmus.  No ptosis.   V:  Normal facial sensation.    VII:  Normal facial symmetry and movements.   VIII:  Normal hearing and vestibular function.   IX-X:  Normal palatal movement.   XI:  Normal shoulder shrug and head rotation.   XII:  Normal tongue strength and range of motion, no deviation or fasciculation.  MOTOR:  Generalized loss of muscle bulk throughout.  No focal atrophy, fasciculations or abnormal movements.  No pronator drift. Motor strength is 5-/5 throughout.  MSRs:                                           Right        Left brachioradialis 2+  2+  biceps  2+  2+  triceps 2+  2+  patellar 1+  1+  ankle jerk 0  0  Hoffman no  no  plantar response down  down   SENSORY:  Vibration and temperature reduced in the feet bilaterally.  Pin prick is intact throughout.  Sensation intact to all modalities in the hands.   COORDINATION/GAIT: Normal finger-to- nose-finger.  Intact rapid alternating movements bilaterally.  Gait is mildly wide-based, stable, assisted with cane.   Thank you for allowing me to participate in patient's care.  If I can answer any additional questions, I would be pleased to do so.    Sincerely,    Elizabelle Fite K. Posey Pronto, DO

## 2022-03-06 NOTE — Telephone Encounter (Signed)
Response sent by pt: Andrea Leblanc  P [facility] Clinical Pool (supporting Chi Rodman Pickle, MD)3 days ago    [employer] delivered the monitor to me on Thursday, 1.4.24 and retrieved it the next day on Friday, January 1.5.24. I of course wore the monitor the night of January 4th.     Paperwork of Dr. Cordelia Pen being dropped off by [facility]. Dr. Loanne Drilling, please advise if pt's results were in your paperwork.

## 2022-03-17 ENCOUNTER — Ambulatory Visit (INDEPENDENT_AMBULATORY_CARE_PROVIDER_SITE_OTHER): Payer: Medicare HMO | Admitting: Pulmonary Disease

## 2022-03-17 ENCOUNTER — Encounter (HOSPITAL_BASED_OUTPATIENT_CLINIC_OR_DEPARTMENT_OTHER): Payer: Self-pay | Admitting: Pulmonary Disease

## 2022-03-17 VITALS — BP 124/74 | HR 82 | Ht 64.0 in | Wt 149.0 lb

## 2022-03-17 DIAGNOSIS — J849 Interstitial pulmonary disease, unspecified: Secondary | ICD-10-CM | POA: Diagnosis not present

## 2022-03-17 NOTE — Progress Notes (Unsigned)
Subjective:   PATIENT ID: Andrea Leblanc GENDER: female DOB: [birth year], MRN: [id number]  No chief complaint on file.   Reason for Visit: Follow-up  [name] is a 72 year old female former smoker with CT-ILD, hx RA and SLE on chronic prednisone and plaquenil, s/p IVIG 08/2021, HTN who presents follow-up  Synopsis: She was previously seen by Dr. Lake Bells in 2018. She presents for surgical clearance for right ankle surgery. Surgery date pending. She reports longstanding history of cough for >20 years after working in the [employer] Cough resolved in 2007 after relocating to [place]. Since then she denies shortness of breath, cough or wheezing. Not active at baseline due to her right ankle pain. Followed by [facility] for CT-ILD and combination of reflux.  03/17/22 Since our last visit she reports no issues since our last visit. Denies shortness of breath, cough, wheezing. Denies unintentional weight loss, fatigue. Does have fatigue related to IDA however improves with iron infusions.  Social History: Unclear pack-years. 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  Past Medical History:  Diagnosis Date   Anemia    Asthma    Chronic kidney disease    Cough variant asthma    Diabetes mellitus, type II ([place])    Hammer toe    Uterine fibroid      Family History  Problem Relation Age of Onset   Healthy Mother      Social History   Occupational History   Occupation: Unemployed  Tobacco Use   Smoking status: Former    Types: Cigarettes    Quit date: 01/31/1986    Years since quitting: 36.1   Smokeless tobacco: Never   Tobacco comments:    pt unsure of how long or how much she smoked betofre quitting.   Vaping Use   Vaping Use: Not on file  Substance and Sexual Activity   Alcohol use: No    Alcohol/week: 0.0 standard drinks of alcohol   Drug use: No   Sexual activity: Not on file     Allergies  Allergen Reactions   Lyrica [Pregabalin] Swelling     Outpatient Medications Prior to Visit  Medication Sig Dispense Refill   amLODipine (NORVASC) 5 MG tablet Take 5 mg by mouth daily.     Biotin 5000 MCG CAPS Take by mouth daily. (Patient not taking: Reported on 02/09/2022)     calcium-vitamin D (OSCAL WITH D) 500-200 MG-UNIT tablet Take 1 tablet by mouth.     Darbepoetin Alfa (ARANESP) 100 MCG/0.5ML SOSY injection Inject 200 mcg into the skin every 8 (eight) weeks.      Darbepoetin Alfa (ARANESP) 200 MCG/0.4ML SOSY injection Inject 200 mcg into the skin every 8 (eight) weeks.      Darbepoetin Alfa (ARANESP) 40 MCG/0.4ML SOSY injection Inject 50 mcg into the skin once.     Darbepoetin Alfa (ARANESP) 60 MCG/0.3ML SOSY injection Inject 50 mcg into the skin.     diphenhydrAMINE (BENADRYL) 25 mg capsule Take 25 mg by mouth every 6 (six) hours as needed (one or two tabs). (Patient not taking: Reported on 02/09/2022)     DULoxetine (CYMBALTA) 60 MG capsule Take 60 mg by mouth daily.     epoetin alfa-epbx (RETACRIT) 78242 UNIT/ML injection 20,000 Units every 30 (thirty) days. (Patient not taking: Reported on 02/09/2022)     Ferrous Sulfate (IRON) 325 (65 Fe) MG TABS Take by mouth daily. (Patient not  taking: Reported on 02/09/2022)     furosemide (LASIX) 40 MG tablet Take 40 mg by mouth.     gabapentin (NEURONTIN) 300 MG capsule '600mg'$  qam, '300mg'$  at noon, '600mg'$  at night (Patient not taking: Reported on 02/09/2022)     gabapentin (NEURONTIN) 800 MG tablet Take 800 mg by mouth 3 (three) times daily.      halobetasol (ULTRAVATE) 0.05 % cream Apply topically 2 (two) times daily. (Patient not taking: Reported on 02/09/2022)     hydroxychloroquine (PLAQUENIL) 200 MG tablet Take by mouth 2 (two) times daily.     loratadine (CLARITIN) 10 MG tablet Take 10 mg by mouth daily. (Patient not taking: Reported on 02/09/2022)     METHOTREXATE PO Take by mouth. (Patient not taking: Reported on  02/09/2022)     MISC NATURAL PRODUCTS PO Take 2 tablets by mouth daily. (Patient not taking: Reported on 02/09/2022)     montelukast (SINGULAIR) 10 MG tablet Take 10 mg by mouth at bedtime. (Patient not taking: Reported on 02/09/2022)     mycophenolate (CELLCEPT) 500 MG tablet Take by mouth 3 (three) times daily.     Omega 3-6-9 Fatty Acids (TRIPLE OMEGA COMPLEX PO) '500mg'$  krill, '500mg'$  fish oil, 500iu vitamin D - Take two tablets daily.     Omega-3 Fatty Acids (OMEGA-3 FISH OIL PO) Take one capsule daily.     omeprazole (PRILOSEC) 40 MG capsule Take 40 mg by mouth daily. (Patient not taking: Reported on 02/09/2022)     oxyCODONE-acetaminophen (PERCOCET) 5-325 MG tablet Take 1 tablet by mouth every 4 (four) hours as needed for severe pain. (Patient not taking: Reported on 02/09/2022) 30 tablet 0   pantoprazole (PROTONIX) 40 MG tablet Take 40 mg by mouth every other day. (Patient not taking: Reported on 02/09/2022)     predniSONE (DELTASONE) 5 MG tablet Take 7 mg by mouth daily with breakfast.      RABEprazole (ACIPHEX) 20 MG tablet Take 20 mg by mouth daily. (Patient not taking: Reported on 02/09/2022)     sertraline (ZOLOFT) 25 MG tablet Take 25 mg by mouth daily. (Patient not taking: Reported on 02/09/2022)     traMADol (ULTRAM) 50 MG tablet Take 50 mg by mouth 3 (three) times daily as needed. (Patient not taking: Reported on 02/09/2022)     triamcinolone cream (KENALOG) 0.1 % Apply 1 application. topically 3 (three) times daily.     vitamin B-12 (CYANOCOBALAMIN) 1000 MCG tablet Take 1,000 mcg by mouth daily. (Patient not taking: Reported on 02/09/2022)     No facility-administered medications prior to visit.    Review of Systems  Constitutional:  Negative for chills, diaphoresis, fever, malaise/fatigue and weight loss.  HENT:  Negative for congestion.   Respiratory:  Negative for cough, hemoptysis, sputum production, shortness of breath and wheezing.   Cardiovascular:  Negative for chest  pain, palpitations and leg swelling.     Objective:   Vitals:   03/17/22 1348  BP: 124/74  Pulse: 82  SpO2: 100%  Weight: 67.6 kg  Height: '5\' 4"'$  (1.626 m)      Physical Exam: General: Well-appearing, no acute distress HENT: [place], AT Eyes: EOMI, no scleral icterus Respiratory: Bibasilar inspiratory crackles.  No wheezing  Cardiovascular: RRR, -M/R/G, no JVD Extremities:-Edema,-tenderness Neuro: AAO x4, CNII-XII grossly intact Psych: Normal mood, normal affect   Data Reviewed:  Imaging: CT Chest 03/10/16 - Subpleural reticulation and septal thickening increased in bibasilar region with honeycombing and bronchiectasis. Consistent with UIP. Small pericardial effusion  CT  Chest HR 04/14/21 [facility] (report only) -  Redemonstrated moderate pulmonary fibrosis in a pattern with  apical to basal gradient, featuring irregular peripheral  interstitial opacity, septal thickening, traction bronchiectasis,  subpleural bronchiolectasis, and extensive areas of honeycombing.  Fibrotic findings may be minimally worsened compared to prior  examination dated 03/13/2019, however are clearly worsened compared  to more remote prior dated 03/10/2016. Findings remain consistent  with UIP per consensus guidelines, and are in keeping with collagen  vascular disorder associated fibrosis:   PFT: 03/10/16 FVC 2.13 (64%) FEV1 1.81 (71%) Ratio 83  TLC 70% DLCO 35%. No significant bronchodilator response Interpretation: Mild restrictive defect with severely reduced gas exchange  07/25/21 [facility] FVC 1.98 (77%) FEV1 1.73 (86%) Ratio 87  TLC 65% DLCO 55% Interpretation: Mild restrictive defect with moderately reduced gas exchange  Labs: CBC    Component Value Date/Time   WBC 5.1 09/02/2020 1446   RBC 3.67 (L) 09/02/2020 1446   HGB 11.6 (L) 03/01/2022 1403   HCT 38.3 09/02/2020 1446   PLT 195 09/02/2020 1446   MCV 104.4 (H) 09/02/2020 1446   MCH 31.6 09/02/2020 1446   MCHC 30.3 09/02/2020 1446    RDW 13.2 09/02/2020 1446   LYMPHSABS 0.5 (L) 10/23/2019 1445   MONOABS 0.2 10/23/2019 1445   EOSABS 0.0 10/23/2019 1445   BASOSABS 0.0 10/23/2019 1445   Absolute eos 10/23/19 - 0  Sleep: Overnight Oximetry 02/24/22 - SpO2 <88% 28 sec. SpO2 nadir 86%. SpO2 baseline 95% Interpretation: No indication for oxygen therapy   Cardiac: TTE 02/16/22 - EF 55-60%. No WMA. Grade I DD. Normal PASP    Assessment & Plan:   Discussion: 72 year old female former smoker with CT-ILD, hx RA and SLE on chronic prednisone and plaquenil, s/p IVIG 08/2021, HTN who presents who follow-up. Denies respiratory symptoms however minimally ambulatory. ONO with no desaturations. Echo overall normal.  CT 03/10/16 consistent with UIP. CT report 04/14/21 commenting on worsened findings compared to 2018 but minimally worsened compared to 03/13/19, neither images available for me to review. Reviewed PFTs which are overall stable with mild restrictive and improved DLCO to moderate severity  Discussed care with her Rheumatologist. From a pulmonary standpoint, no indication for immunosuppressants at this time. Will continue serial monitoring of PFTs and CT imaging. Did discuss anti-fibrotics however patient declined as she is not interested in adding additional medications. Did not want to discuss potential risk or benefits.  CT-ILD - stable lung function on PFTs and reportedly stable imaging since 02/2019 --ORDER High resolution CT chest without contrast in 3 weeks --Encouraged regular aerobic activity five days a week. Declined pulmonary rehab for now.  Follow-up with me in 4 weeks    Health Maintenance Immunization History  Administered Date(s) Administered   PFIZER(Purple Top)SARS-COV-2 Vaccination 04/20/2019, 05/19/2019   Pneumococcal Conjugate-13 05/01/2017   Pneumococcal Polysaccharide-23 02/19/2018   CT Lung Screen - not qualified. Quit >15 years ago. Insufficient tobacco history  Orders Placed This Encounter   Procedures   CT Chest High Resolution    Standing Status:   Future    Standing Expiration Date:   03/18/2023    Scheduling Instructions:     Schedule in 3wks    Order Specific Question:   Preferred imaging location?    Answer:   [facility]  No orders of the defined types were placed in this encounter.   Return in about 4 weeks (around 04/14/2022).  I have spent a total time of 35-minutes on the day of  the appointment including chart review, data review, collecting history, coordinating care and discussing medical diagnosis and plan with the patient/family. Past medical history, allergies, medications were reviewed. Pertinent imaging, labs and tests included in this note have been reviewed and interpreted independently by me.  [place], MD [facility] 03/17/2022 1:43 PM  Office Number [phone]

## 2022-03-17 NOTE — Patient Instructions (Signed)
CT-ILD - stable lung function on PFTs and reportedly stable imaging since 02/2019 --No immunosuppressants for now --Declined anti-fibrotics --ORDER High resolution CT chest without contrast in 3 weeks --Encouraged regular aerobic activity five days a week. Declined pulmonary rehab for now  Follow-up with me in 4 weeks

## 2022-03-20 ENCOUNTER — Encounter (HOSPITAL_BASED_OUTPATIENT_CLINIC_OR_DEPARTMENT_OTHER): Payer: Self-pay | Admitting: Pulmonary Disease

## 2022-04-07 ENCOUNTER — Other Ambulatory Visit (HOSPITAL_BASED_OUTPATIENT_CLINIC_OR_DEPARTMENT_OTHER): Payer: Medicare HMO

## 2022-04-08 ENCOUNTER — Ambulatory Visit (HOSPITAL_BASED_OUTPATIENT_CLINIC_OR_DEPARTMENT_OTHER)
Admission: RE | Admit: 2022-04-08 | Discharge: 2022-04-08 | Disposition: A | Discharge status: Home / Self Care | Payer: Medicare HMO | Source: Ambulatory Visit | Attending: Pulmonary Disease | Admitting: Pulmonary Disease

## 2022-04-08 DIAGNOSIS — J479 Bronchiectasis, uncomplicated: Secondary | ICD-10-CM | POA: Diagnosis not present

## 2022-04-08 DIAGNOSIS — J849 Interstitial pulmonary disease, unspecified: Secondary | ICD-10-CM | POA: Insufficient documentation

## 2022-04-12 ENCOUNTER — Encounter (HOSPITAL_COMMUNITY)
Admission: RE | Admit: 2022-04-12 | Discharge: 2022-04-12 | Disposition: A | Discharge status: Home / Self Care | Payer: Medicare HMO | Source: Ambulatory Visit | Attending: Internal Medicine | Admitting: Internal Medicine

## 2022-04-12 VITALS — BP 141/78 | HR 94 | Temp 98.8°F | Resp 20

## 2022-04-12 DIAGNOSIS — N183 Chronic kidney disease, stage 3 unspecified: Secondary | ICD-10-CM | POA: Diagnosis not present

## 2022-04-12 DIAGNOSIS — D631 Anemia in chronic kidney disease: Secondary | ICD-10-CM | POA: Diagnosis not present

## 2022-04-12 LAB — POCT HEMOGLOBIN-HEMACUE: Hemoglobin: 11.9 g/dL — ABNORMAL LOW (ref 12.0–15.0)

## 2022-04-12 MED ORDER — DARBEPOETIN ALFA 60 MCG/0.3ML IJ SOSY
50.0000 ug | PREFILLED_SYRINGE | Freq: Once | INTRAMUSCULAR | Status: AC
  Administered 2022-04-12: 50 ug via SUBCUTANEOUS

## 2022-04-12 MED ORDER — CLONIDINE HCL 0.1 MG PO TABS: 0.1000 mg | ORAL_TABLET | ORAL | Status: DC | PRN

## 2022-04-12 NOTE — Progress Notes (Signed)
Diagnosis: Anemia in Chronic Kidney Disease  Provider:   Jannifer Hick MD  Procedure: Injection  Aranesp (darbepoetin alfa) , Dose: 38mg , Site: subcutaneous, Number of injections: 1  Hgb 11.9  Unable to obtain enough blood for labwork on first stick. Patient refused second stick for labwork.   Post Care: Patient declined observation  Discharge: Condition: Stable, Destination: Home . AVS Provided  Performed by:  EBinnie Kand RN

## 2022-04-17 ENCOUNTER — Telehealth: Payer: Self-pay | Admitting: Urology

## 2022-04-17 NOTE — Telephone Encounter (Signed)
DOS - 05/10/22  METATARSAL OSTEOTOMY 5TH RIGHT --- 28308 TARSAL EXOSTECTOMY RIGHT --- 28104  [employer]   PER [facility] [employer] FOR CPT CODE 69629 NO PRIOR AUTH IS REQUIRED. RECEIVED AN EMAIL FROM [employer] STATING  Thank you for submitting a Prior Authorization with [facility].  After reviewing the case, we have determined that Prior authorization not required for service. Therefore, we have withdrawn the request.   TRACKING ID # [id number]

## 2022-04-21 DIAGNOSIS — Z299 Encounter for prophylactic measures, unspecified: Secondary | ICD-10-CM | POA: Diagnosis not present

## 2022-04-21 DIAGNOSIS — E1165 Type 2 diabetes mellitus with hyperglycemia: Secondary | ICD-10-CM | POA: Diagnosis not present

## 2022-04-21 DIAGNOSIS — I10 Essential (primary) hypertension: Secondary | ICD-10-CM | POA: Diagnosis not present

## 2022-05-01 DIAGNOSIS — R768 Other specified abnormal immunological findings in serum: Secondary | ICD-10-CM | POA: Diagnosis not present

## 2022-05-01 DIAGNOSIS — R159 Full incontinence of feces: Secondary | ICD-10-CM | POA: Diagnosis not present

## 2022-05-01 DIAGNOSIS — N189 Chronic kidney disease, unspecified: Secondary | ICD-10-CM | POA: Diagnosis not present

## 2022-05-01 DIAGNOSIS — J849 Interstitial pulmonary disease, unspecified: Secondary | ICD-10-CM | POA: Diagnosis not present

## 2022-05-01 DIAGNOSIS — G629 Polyneuropathy, unspecified: Secondary | ICD-10-CM | POA: Diagnosis not present

## 2022-05-01 DIAGNOSIS — Z780 Asymptomatic menopausal state: Secondary | ICD-10-CM | POA: Diagnosis not present

## 2022-05-01 DIAGNOSIS — R531 Weakness: Secondary | ICD-10-CM | POA: Diagnosis not present

## 2022-05-01 DIAGNOSIS — R634 Abnormal weight loss: Secondary | ICD-10-CM | POA: Diagnosis not present

## 2022-05-01 DIAGNOSIS — M329 Systemic lupus erythematosus, unspecified: Secondary | ICD-10-CM | POA: Diagnosis not present

## 2022-05-01 DIAGNOSIS — Z13820 Encounter for screening for osteoporosis: Secondary | ICD-10-CM | POA: Diagnosis not present

## 2022-05-08 DIAGNOSIS — R159 Full incontinence of feces: Secondary | ICD-10-CM | POA: Diagnosis not present

## 2022-05-08 DIAGNOSIS — R933 Abnormal findings on diagnostic imaging of other parts of digestive tract: Secondary | ICD-10-CM | POA: Diagnosis not present

## 2022-05-09 DIAGNOSIS — I129 Hypertensive chronic kidney disease with stage 1 through stage 4 chronic kidney disease, or unspecified chronic kidney disease: Secondary | ICD-10-CM | POA: Diagnosis not present

## 2022-05-09 DIAGNOSIS — M329 Systemic lupus erythematosus, unspecified: Secondary | ICD-10-CM | POA: Diagnosis not present

## 2022-05-09 DIAGNOSIS — D631 Anemia in chronic kidney disease: Secondary | ICD-10-CM | POA: Diagnosis not present

## 2022-05-09 DIAGNOSIS — J84112 Idiopathic pulmonary fibrosis: Secondary | ICD-10-CM | POA: Diagnosis not present

## 2022-05-09 DIAGNOSIS — N183 Chronic kidney disease, stage 3 unspecified: Secondary | ICD-10-CM | POA: Diagnosis not present

## 2022-05-09 NOTE — Progress Notes (Signed)
Attempted to call patient for SDW. Patient states she is unable to write the information down, she is heading to another MD appointment.  Informed patient information will be left on her answering machine and she can call back with any questions.  Information regarding surgery date, arrival time, location, meds, NPO, visitors, showing and clothing left for patient.  Unable to verify any information with patient such as allergies etc at this time. Will attempt to call patient back later this evening to try to complete SDW call.

## 2022-05-09 NOTE — Anesthesia Preprocedure Evaluation (Signed)
Anesthesia Evaluation  Patient identified by MRN, date of birth, ID band Patient awake    Reviewed: Allergy & Precautions, NPO status , Patient's Chart, lab work & pertinent test results  Airway Mallampati: II  TM Distance: >3 FB Neck ROM: Full    Dental  (+) Dental Advisory Given   Pulmonary asthma , former smoker ILD   breath sounds clear to auscultation       Cardiovascular negative cardio ROS  Rhythm:Regular Rate:Normal     Neuro/Psych  Neuromuscular disease    GI/Hepatic negative GI ROS, Neg liver ROS,,,  Endo/Other  diabetes, Type 2    Renal/GU CRFRenal disease     Musculoskeletal   Abdominal   Peds  Hematology  (+) Blood dyscrasia, anemia   Anesthesia Other Findings   Reproductive/Obstetrics                             Anesthesia Physical Anesthesia Plan  ASA: 3  Anesthesia Plan: MAC   Post-op Pain Management: Tylenol PO (pre-op)*   Induction:   PONV Risk Score and Plan: 3 and Ondansetron, Treatment may vary due to age or medical condition and Propofol infusion  Airway Management Planned: Mask  Additional Equipment:   Intra-op Plan:   Post-operative Plan:   Informed Consent: I have reviewed the patients History and Physical, chart, labs and discussed the procedure including the risks, benefits and alternatives for the proposed anesthesia with the patient or authorized representative who has indicated his/her understanding and acceptance.     Dental advisory given  Plan Discussed with: CRNA  Anesthesia Plan Comments: (PAT note by Karoline Caldwell, PA-C:  72 year old female former smoker with CT-ILD, iron deficiency anemia (received iron infusion), RA and SLE on chronic prednisone and plaquenil, s/p IVIG 08/2021.  Echo 01/2022 showed EF 55 to 60%, normal wall motion, grade 1 DD, normal PASP.  Overnight oximetry 02/2022 showed no indication for oxygen therapy.  Last seen by  pulmonologist Dr. Loanne Drilling on 03/17/2022, noted to be stable at that time, no indication for immunosuppressants, patient declined antifibrotic's as she is not interested in adding additional medications at this time.  CBC 05/01/2022 in Care Everywhere reviewed, mild anemia hemoglobin 12.0, otherwise unremarkable.  Patient will need day of surgery labs and evaluation.  CT chest 04/08/2022: IMPRESSION: 1. Minimal progression of pulmonary fibrosis with a spectrum of imaging findings once again considered diagnostic of usual interstitial pneumonia (UIP) per current ATS guidelines. In this patient, this is likely a pulmonary manifestation of scleroderma given the patulous fluid-filled esophagus. 2. Aortic atherosclerosis, in addition to left main and 2 vessel coronary artery disease. Please note that although the presence of coronary artery calcium documents the presence of coronary artery disease, the severity of this disease and any potential stenosis cannot be assessed on this non-gated CT examination. Assessment for potential risk factor modification, dietary therapy or pharmacologic therapy may be warranted, if clinically indicated.  TTE 02/16/2022: 1. Left ventricular ejection fraction, by estimation, is 55 to 60%. Left  ventricular ejection fraction by PLAX is 59 %. The left ventricle has  normal function. The left ventricle has no regional wall motion  abnormalities. There is mild left ventricular  hypertrophy. Left ventricular diastolic parameters are consistent with  Grade I diastolic dysfunction (impaired relaxation).  2. Right ventricular systolic function is normal. The right ventricular  size is normal. There is normal pulmonary artery systolic pressure. The  estimated right ventricular systolic  pressure is 25.5 mmHg.  3. The mitral valve is grossly normal. Trivial mitral valve  regurgitation.  4. The aortic valve is tricuspid. Aortic valve regurgitation is not  visualized.   5. The inferior vena cava is normal in size with greater than 50%  respiratory variability, suggesting right atrial pressure of 3 mmHg.   PFT 07/25/21 [facility] FVC 1.98 (77%) FEV1 1.73 (86%) Ratio 87  TLC 65% DLCO 55% Interpretation: Mild restrictive defect with moderately reduced gas exchange    )        Anesthesia Quick Evaluation

## 2022-05-09 NOTE — Progress Notes (Signed)
Anesthesia Chart Review: Same-day workup  72 year old female former smoker with CT-ILD, iron deficiency anemia (received iron infusion), RA and SLE on chronic prednisone and plaquenil, s/p IVIG 08/2021.  Echo 01/2022 showed EF 55 to 60%, normal wall motion, grade 1 DD, normal PASP.  Overnight oximetry 02/2022 showed no indication for oxygen therapy.  Last seen by pulmonologist Dr. Loanne Drilling on 03/17/2022, noted to be stable at that time, no indication for immunosuppressants, patient declined antifibrotic's as she is not interested in adding additional medications at this time.  CBC 05/01/2022 in Care Everywhere reviewed, mild anemia hemoglobin 12.0, otherwise unremarkable.  Patient will need day of surgery labs and evaluation.  CT chest 04/08/2022: IMPRESSION: 1. Minimal progression of pulmonary fibrosis with a spectrum of imaging findings once again considered diagnostic of usual interstitial pneumonia (UIP) per current ATS guidelines. In this patient, this is likely a pulmonary manifestation of scleroderma given the patulous fluid-filled esophagus. 2. Aortic atherosclerosis, in addition to left main and 2 vessel coronary artery disease. Please note that although the presence of coronary artery calcium documents the presence of coronary artery disease, the severity of this disease and any potential stenosis cannot be assessed on this non-gated CT examination. Assessment for potential risk factor modification, dietary therapy or pharmacologic therapy may be warranted, if clinically indicated.  TTE 02/16/2022:  1. Left ventricular ejection fraction, by estimation, is 55 to 60%. Left  ventricular ejection fraction by PLAX is 59 %. The left ventricle has  normal function. The left ventricle has no regional wall motion  abnormalities. There is mild left ventricular  hypertrophy. Left ventricular diastolic parameters are consistent with  Grade I diastolic dysfunction (impaired relaxation).   2.  Right ventricular systolic function is normal. The right ventricular  size is normal. There is normal pulmonary artery systolic pressure. The  estimated right ventricular systolic pressure is [id number] mmHg.   3. The mitral valve is grossly normal. Trivial mitral valve  regurgitation.   4. The aortic valve is tricuspid. Aortic valve regurgitation is not  visualized.   5. The inferior vena cava is normal in size with greater than 50%  respiratory variability, suggesting right atrial pressure of 3 mmHg.   PFT 07/25/21 [facility] FVC 1.98 (77%) FEV1 1.73 (86%) Ratio 87  TLC 65% DLCO 55% Interpretation: Mild restrictive defect with moderately reduced gas exchange     Andrea Leblanc [facility] Short Stay Center/Anesthesiology Phone [phone] 05/09/2022 10:56 AM

## 2022-05-10 ENCOUNTER — Other Ambulatory Visit: Payer: Self-pay

## 2022-05-10 ENCOUNTER — Telehealth: Payer: Self-pay | Admitting: *Deleted

## 2022-05-11 ENCOUNTER — Ambulatory Visit (HOSPITAL_COMMUNITY)
Admission: RE | Admit: 2022-05-11 | Discharge: 2022-05-11 | Disposition: A | Discharge status: Home / Self Care | Payer: Medicare HMO | Attending: Podiatry | Admitting: Podiatry

## 2022-05-11 ENCOUNTER — Ambulatory Visit (HOSPITAL_BASED_OUTPATIENT_CLINIC_OR_DEPARTMENT_OTHER): Payer: Medicare HMO | Admitting: Physician Assistant

## 2022-05-11 ENCOUNTER — Encounter (HOSPITAL_COMMUNITY)
Admission: RE | Disposition: A | Discharge status: Home / Self Care | Payer: Self-pay | Source: Home / Self Care | Attending: Podiatry

## 2022-05-11 ENCOUNTER — Other Ambulatory Visit: Payer: Self-pay

## 2022-05-11 ENCOUNTER — Encounter (HOSPITAL_COMMUNITY): Payer: Self-pay | Admitting: Podiatry

## 2022-05-11 ENCOUNTER — Ambulatory Visit (HOSPITAL_COMMUNITY): Payer: Medicare HMO

## 2022-05-11 ENCOUNTER — Ambulatory Visit (HOSPITAL_COMMUNITY): Payer: Medicare HMO | Admitting: Physician Assistant

## 2022-05-11 ENCOUNTER — Other Ambulatory Visit: Payer: Self-pay | Admitting: Podiatry

## 2022-05-11 DIAGNOSIS — E1122 Type 2 diabetes mellitus with diabetic chronic kidney disease: Secondary | ICD-10-CM | POA: Insufficient documentation

## 2022-05-11 DIAGNOSIS — M898X7 Other specified disorders of bone, ankle and foot: Secondary | ICD-10-CM

## 2022-05-11 DIAGNOSIS — D631 Anemia in chronic kidney disease: Secondary | ICD-10-CM | POA: Diagnosis not present

## 2022-05-11 DIAGNOSIS — Z87891 Personal history of nicotine dependence: Secondary | ICD-10-CM | POA: Insufficient documentation

## 2022-05-11 DIAGNOSIS — M7731 Calcaneal spur, right foot: Secondary | ICD-10-CM | POA: Diagnosis not present

## 2022-05-11 DIAGNOSIS — E1149 Type 2 diabetes mellitus with other diabetic neurological complication: Secondary | ICD-10-CM | POA: Diagnosis not present

## 2022-05-11 DIAGNOSIS — M898X1 Other specified disorders of bone, shoulder: Secondary | ICD-10-CM | POA: Diagnosis not present

## 2022-05-11 DIAGNOSIS — D509 Iron deficiency anemia, unspecified: Secondary | ICD-10-CM | POA: Insufficient documentation

## 2022-05-11 DIAGNOSIS — J849 Interstitial pulmonary disease, unspecified: Secondary | ICD-10-CM | POA: Diagnosis not present

## 2022-05-11 DIAGNOSIS — M899 Disorder of bone, unspecified: Secondary | ICD-10-CM | POA: Insufficient documentation

## 2022-05-11 DIAGNOSIS — Z79899 Other long term (current) drug therapy: Secondary | ICD-10-CM | POA: Insufficient documentation

## 2022-05-11 DIAGNOSIS — Z7952 Long term (current) use of systemic steroids: Secondary | ICD-10-CM | POA: Diagnosis not present

## 2022-05-11 DIAGNOSIS — N189 Chronic kidney disease, unspecified: Secondary | ICD-10-CM | POA: Diagnosis not present

## 2022-05-11 DIAGNOSIS — M21271 Flexion deformity, right ankle and toes: Secondary | ICD-10-CM | POA: Diagnosis not present

## 2022-05-11 DIAGNOSIS — J45909 Unspecified asthma, uncomplicated: Secondary | ICD-10-CM | POA: Diagnosis not present

## 2022-05-11 DIAGNOSIS — M329 Systemic lupus erythematosus, unspecified: Secondary | ICD-10-CM | POA: Diagnosis not present

## 2022-05-11 DIAGNOSIS — M069 Rheumatoid arthritis, unspecified: Secondary | ICD-10-CM | POA: Insufficient documentation

## 2022-05-11 DIAGNOSIS — M21541 Acquired clubfoot, right foot: Secondary | ICD-10-CM | POA: Diagnosis not present

## 2022-05-11 HISTORY — PX: METATARSAL OSTEOTOMY: SHX1641

## 2022-05-11 HISTORY — PX: EXOSTECTECTOMY TOE: SHX6618

## 2022-05-11 LAB — CBC
HCT: 34.6 % — ABNORMAL LOW (ref 36.0–46.0)
Hemoglobin: 11.3 g/dL — ABNORMAL LOW (ref 12.0–15.0)
MCH: 32.8 pg (ref 26.0–34.0)
MCHC: 32.7 g/dL (ref 30.0–36.0)
MCV: 100.6 fL — ABNORMAL HIGH (ref 80.0–100.0)
Platelets: 204 10*3/uL (ref 150–400)
RBC: 3.44 MIL/uL — ABNORMAL LOW (ref 3.87–5.11)
RDW: 14.2 % (ref 11.5–15.5)
WBC: 6 10*3/uL (ref 4.0–10.5)
nRBC: 0 % (ref 0.0–0.2)

## 2022-05-11 LAB — BASIC METABOLIC PANEL
Anion gap: 11 (ref 5–15)
BUN: 19 mg/dL (ref 8–23)
CO2: 21 mmol/L — ABNORMAL LOW (ref 22–32)
Calcium: 8.9 mg/dL (ref 8.9–10.3)
Chloride: 107 mmol/L (ref 98–111)
Creatinine, Ser: 1.24 mg/dL — ABNORMAL HIGH (ref 0.44–1.00)
GFR, Estimated: 47 mL/min — ABNORMAL LOW (ref >60)
Glucose, Bld: 78 mg/dL (ref 70–99)
Potassium: 3.7 mmol/L (ref 3.5–5.1)
Sodium: 139 mmol/L (ref 135–145)

## 2022-05-11 LAB — GLUCOSE, CAPILLARY
Glucose-Capillary: 110 mg/dL — ABNORMAL HIGH (ref 70–99)
Glucose-Capillary: 68 mg/dL — ABNORMAL LOW (ref 70–99)
Glucose-Capillary: 54 mg/dL — ABNORMAL LOW (ref 70–99)
Glucose-Capillary: 69 mg/dL — ABNORMAL LOW (ref 70–99)
Glucose-Capillary: 102 mg/dL — ABNORMAL HIGH (ref 70–99)
Glucose-Capillary: 61 mg/dL — ABNORMAL LOW (ref 70–99)
Glucose-Capillary: 75 mg/dL (ref 70–99)
Glucose-Capillary: 64 mg/dL — ABNORMAL LOW (ref 70–99)
Glucose-Capillary: 132 mg/dL — ABNORMAL HIGH (ref 70–99)

## 2022-05-11 SURGERY — OSTEOTOMY, METATARSAL BONE
Anesthesia: Monitor Anesthesia Care | Site: Toe | Laterality: Right

## 2022-05-11 MED ORDER — LIDOCAINE 2% (20 MG/ML) 5 ML SYRINGE
INTRAMUSCULAR | Status: DC | PRN
  Administered 2022-05-11: 40 mg via INTRAVENOUS

## 2022-05-11 MED ORDER — ONDANSETRON HCL 4 MG/2ML IJ SOLN
INTRAMUSCULAR | Status: DC | PRN
  Administered 2022-05-11: 4 mg via INTRAVENOUS

## 2022-05-11 MED ORDER — DEXTROSE 50 % IV SOLN
INTRAVENOUS | Status: AC
  Administered 2022-05-11: 25 mL
  Filled 2022-05-11: qty 50

## 2022-05-11 MED ORDER — LIDOCAINE HCL 1 % IJ SOLN
INTRAMUSCULAR | Status: DC | PRN
  Administered 2022-05-11: 10 mL via INTRAMUSCULAR

## 2022-05-11 MED ORDER — LIDOCAINE HCL (PF) 1 % IJ SOLN
INTRAMUSCULAR | Status: AC
  Filled 2022-05-11: qty 30

## 2022-05-11 MED ORDER — PROPOFOL 10 MG/ML IV BOLUS
INTRAVENOUS | Status: AC
  Filled 2022-05-11: qty 20

## 2022-05-11 MED ORDER — CEFAZOLIN SODIUM-DEXTROSE 2-4 GM/100ML-% IV SOLN
2.0000 g | Freq: Once | INTRAVENOUS | Status: AC
  Administered 2022-05-11 (×2): 2 g via INTRAVENOUS
  Filled 2022-05-11: qty 100

## 2022-05-11 MED ORDER — DEXTROSE 50 % IV SOLN
INTRAVENOUS | Status: AC
  Administered 2022-05-11 (×2): 25 mL
  Filled 2022-05-11: qty 50

## 2022-05-11 MED ORDER — ACETAMINOPHEN 500 MG PO TABS
1000.0000 mg | ORAL_TABLET | Freq: Once | ORAL | Status: AC
  Administered 2022-05-11: 1000 mg via ORAL
  Filled 2022-05-11: qty 2

## 2022-05-11 MED ORDER — IBUPROFEN 800 MG PO TABS: 800.0000 mg | ORAL_TABLET | Freq: Four times a day (QID) | ORAL | 1 refills | Status: AC | PRN

## 2022-05-11 MED ORDER — LIDOCAINE 2% (20 MG/ML) 5 ML SYRINGE
INTRAMUSCULAR | Status: AC
  Filled 2022-05-11: qty 5

## 2022-05-11 MED ORDER — FENTANYL CITRATE (PF) 250 MCG/5ML IJ SOLN
INTRAMUSCULAR | Status: AC
  Filled 2022-05-11: qty 5

## 2022-05-11 MED ORDER — OXYCODONE-ACETAMINOPHEN 5-325 MG PO TABS: 1.0000 | ORAL_TABLET | ORAL | 0 refills | Status: AC | PRN

## 2022-05-11 MED ORDER — ONDANSETRON HCL 4 MG/2ML IJ SOLN
INTRAMUSCULAR | Status: AC
  Filled 2022-05-11: qty 2

## 2022-05-11 MED ORDER — CHLORHEXIDINE GLUCONATE 0.12 % MT SOLN
OROMUCOSAL | Status: AC
  Administered 2022-05-11: 15 mL via OROMUCOSAL
  Filled 2022-05-11: qty 15

## 2022-05-11 MED ORDER — AMISULPRIDE (ANTIEMETIC) 5 MG/2ML IV SOLN: 10.0000 mg | Freq: Once | INTRAVENOUS | Status: DC | PRN

## 2022-05-11 MED ORDER — DEXTROSE 50 % IV SOLN
12.5000 g | INTRAVENOUS | Status: AC
  Administered 2022-05-11: 12.5 g via INTRAVENOUS
  Filled 2022-05-11: qty 50

## 2022-05-11 MED ORDER — BUPIVACAINE HCL (PF) 0.5 % IJ SOLN
INTRAMUSCULAR | Status: AC
  Filled 2022-05-11: qty 30

## 2022-05-11 MED ORDER — FENTANYL CITRATE (PF) 100 MCG/2ML IJ SOLN: 25.0000 ug | INTRAMUSCULAR | Status: DC | PRN

## 2022-05-11 MED ORDER — PROPOFOL 500 MG/50ML IV EMUL
INTRAVENOUS | Status: DC | PRN
  Administered 2022-05-11: 125 ug/kg/min via INTRAVENOUS

## 2022-05-11 MED ORDER — FENTANYL CITRATE (PF) 250 MCG/5ML IJ SOLN
INTRAMUSCULAR | Status: DC | PRN
  Administered 2022-05-11: 50 ug via INTRAVENOUS

## 2022-05-11 MED ORDER — ORAL CARE MOUTH RINSE: 15.0000 mL | Freq: Once | OROMUCOSAL | Status: AC

## 2022-05-11 MED ORDER — CHLORHEXIDINE GLUCONATE 0.12 % MT SOLN: 15.0000 mL | Freq: Once | OROMUCOSAL | Status: AC

## 2022-05-11 MED ORDER — DEXTROSE 50 % IV SOLN
INTRAVENOUS | Status: AC
  Filled 2022-05-11: qty 50

## 2022-05-11 MED ORDER — LACTATED RINGERS IV SOLN: INTRAVENOUS | Status: DC

## 2022-05-11 SURGICAL SUPPLY — 47 items
APL PRP STRL LF DISP 70% ISPRP (MISCELLANEOUS) ×2
BANDAGE ESMARK 6X9 LF (GAUZE/BANDAGES/DRESSINGS) IMPLANT
BLADE AVERAGE 25X9 (BLADE) IMPLANT
BLADE SURG 15 STRL LF DISP TIS (BLADE) ×4 IMPLANT
BLADE SURG 15 STRL SS (BLADE) ×2
BNDG CMPR 5X3 KNIT ELC UNQ LF (GAUZE/BANDAGES/DRESSINGS)
BNDG CMPR 9X6 STRL LF SNTH (GAUZE/BANDAGES/DRESSINGS) ×2
BNDG ELASTIC 3INX 5YD STR LF (GAUZE/BANDAGES/DRESSINGS) IMPLANT
BNDG ELASTIC 4X5.8 VLCR STR LF (GAUZE/BANDAGES/DRESSINGS) ×2 IMPLANT
BNDG ESMARK 6X9 LF (GAUZE/BANDAGES/DRESSINGS) ×2
BNDG GAUZE DERMACEA FLUFF 4 (GAUZE/BANDAGES/DRESSINGS) ×4 IMPLANT
BNDG GZE DERMACEA 4 6PLY (GAUZE/BANDAGES/DRESSINGS) ×2
CHLORAPREP W/TINT 26 (MISCELLANEOUS) ×2 IMPLANT
COVER BACK TABLE 60X90IN (DRAPES) ×2 IMPLANT
CUFF TOURN SGL QUICK 34 (TOURNIQUET CUFF)
CUFF TRNQT CYL 34X4.125X (TOURNIQUET CUFF) IMPLANT
DRAPE EXTREMITY T 121X128X90 (DISPOSABLE) ×2 IMPLANT
DRAPE IMP U-DRAPE 54X76 (DRAPES) ×2 IMPLANT
DRAPE SURG 17X23 STRL (DRAPES) IMPLANT
DRAPE U-SHAPE 47X51 STRL (DRAPES) ×2 IMPLANT
DRSG EMULSION OIL 3X3 NADH (GAUZE/BANDAGES/DRESSINGS) ×2 IMPLANT
DRSG XEROFORM 1X8 (GAUZE/BANDAGES/DRESSINGS) IMPLANT
ELECT REM PT RETURN 9FT ADLT (ELECTROSURGICAL) ×2
ELECTRODE REM PT RTRN 9FT ADLT (ELECTROSURGICAL) ×2 IMPLANT
GAUZE SPONGE 4X4 12PLY STRL (GAUZE/BANDAGES/DRESSINGS) ×2 IMPLANT
GLOVE BIO SURGEON STRL SZ7 (GLOVE) ×2 IMPLANT
GLOVE BIOGEL PI IND STRL 7.5 (GLOVE) ×2 IMPLANT
GOWN STRL REUS W/ TWL LRG LVL3 (GOWN DISPOSABLE) ×2 IMPLANT
GOWN STRL REUS W/ TWL XL LVL3 (GOWN DISPOSABLE) ×2 IMPLANT
GOWN STRL REUS W/TWL LRG LVL3 (GOWN DISPOSABLE) ×2
GOWN STRL REUS W/TWL XL LVL3 (GOWN DISPOSABLE) ×2
KIT BASIN OR (CUSTOM PROCEDURE TRAY) ×2 IMPLANT
NDL HYPO 25X1 1.5 SAFETY (NEEDLE) ×2 IMPLANT
NEEDLE HYPO 25X1 1.5 SAFETY (NEEDLE) ×2 IMPLANT
NS IRRIG 1000ML POUR BTL (IV SOLUTION) IMPLANT
PADDING CAST ABS COTTON 4X4 ST (CAST SUPPLIES) ×4 IMPLANT
PENCIL SMOKE EVACUATOR (MISCELLANEOUS) ×2 IMPLANT
STOCKINETTE 6  STRL (DRAPES) ×2
STOCKINETTE 6 STRL (DRAPES) ×2 IMPLANT
SUT BONE WAX W31G (SUTURE) IMPLANT
SUT MNCRL AB 3-0 PS2 18 (SUTURE) IMPLANT
SUT MNCRL AB 4-0 PS2 18 (SUTURE) IMPLANT
SUT PROLENE 3 0 PS 2 (SUTURE) ×2 IMPLANT
SYR BULB EAR ULCER 3OZ GRN STR (SYRINGE) ×2 IMPLANT
SYR CONTROL 10ML LL (SYRINGE) IMPLANT
UNDERPAD 30X36 HEAVY ABSORB (UNDERPADS AND DIAPERS) ×2 IMPLANT
YANKAUER SUCT BULB TIP NO VENT (SUCTIONS) ×2 IMPLANT

## 2022-05-11 NOTE — Progress Notes (Signed)
Hypoglycemic Event  CBG: 69  Treatment: D50 25 mL (12.5 gm)  Symptoms: None  Follow-up CBG: Time:1330 CBG Result:  Possible Reasons for Event: Inadequate meal intake  Will continue to monitor    Andrea Leblanc Andrea Leblanc

## 2022-05-11 NOTE — H&P (View-Only) (Signed)
Hypoglycemic Event  CBG: 68  Treatment: D50 25 mL (12.5 gm)  Symptoms: None  Follow-up CBG: Time:1015 CBG Result: 110  Possible Reasons for Event: Inadequate meal intake  Comments/MD notified:Will notifiy Dr. Ermalene Postin     Andrea Leblanc [name]   Did not initiate glycemic protocol.  Patient stated she was not diabetic and does not check sugar at home or take any medication for diabetes at home. Will continue to monitor patient's CBG.

## 2022-05-11 NOTE — Transfer of Care (Signed)
Immediate Anesthesia Transfer of Care Note  Patient: Andrea Leblanc  Procedure(s) Performed: METATARSAL OSTEOTOMY (Right: Toe) TARSAL EXOSTECTECTOMY TOE (Right: Foot)  Patient Location: PACU  Anesthesia Type:MAC combined with regional for post-op pain  Level of Consciousness: awake, alert , and oriented  Airway & Oxygen Therapy: Patient Spontanous Breathing and Patient connected to nasal cannula oxygen  Post-op Assessment: Report given to RN and Post -op Vital signs reviewed and stable  Post vital signs: Reviewed and stable  Last Vitals:  Vitals Value Taken Time  BP    Temp    Pulse 71 05/11/22 1637  Resp 14 05/11/22 1637  SpO2 100 % 05/11/22 1637  Vitals shown include unvalidated device data.  Last Pain:  Vitals:   05/11/22 1015  TempSrc:   PainSc: 0-No pain         Complications: No notable events documented.

## 2022-05-11 NOTE — Discharge Instructions (Signed)
After Surgery Instructions   1) If you are recuperating from surgery anywhere other than home, please be sure to leave us the number where you can be reached.  2) Go directly home and rest.  3) Keep the operated foot(feet) elevated six inches above the hip when sitting or lying down. This will help control swelling and pain.  4) Support the elevated foot and leg with pillows. DO NOT PLACE PILLOWS UNDER THE KNEE.  5) DO NOT REMOVE or get your bandages WET, unless you were given different instructions by your doctor to do so. This increases the risk of infection.  6) Wear your surgical shoe or surgical boot at all times when you are up on your feet.  7) A limited amount of pain and swelling may occur. The skin may take on a bruised appearance. DO NOT BE ALARMED, THIS IS NORMAL.  8) For slight pain and swelling, apply an ice pack directly over the bandages for 15 minutes only out of each hour of the day. Continue until seen in the office for your first post op visit. DON NOT     APPLY ANY FORM OF HEAT TO THE AREA.  9) Have prescriptions filled immediately and take as directed.  10) Drink lots of liquids, water and juice to stay hydrated.  11) CALL IMMEDIATELY IF:  *Bleeding continues until the following day of surgery  *Pain increases and/or does not respond to medication  *Bandages or cast appears to tight  *If your bandage gets wet  *Trip, fall or stump your surgical foot  *If your temperature goes above 101  *If you have ANY questions at all  YOU NOW CONTROL THE EFFORT OF YOUR RECOVERY. ADHERING TO THESE INSTRUCTIONS WILL OFFER YOU THE MOST COMPLETE RESULTS  

## 2022-05-11 NOTE — Anesthesia Procedure Notes (Signed)
Procedure Name: MAC Date/Time: 05/11/2022 4:10 PM  Performed by: Alain Marion, CRNAPre-anesthesia Checklist: Patient identified, Emergency Drugs available, Suction available and Patient being monitored Oxygen Delivery Method: Nasal cannula Placement Confirmation: positive ETCO2

## 2022-05-11 NOTE — Progress Notes (Addendum)
Hypoglycemic Event  CBG: 68  Treatment: D50 25 mL (12.5 gm)  Symptoms: None  Follow-up CBG: Time:1015 CBG Result: 110  Possible Reasons for Event: Inadequate meal intake  Comments/MD notified:Will notifiy Dr. Ermalene Postin     Kameren Pargas [name]   Did not initiate glycemic protocol.  Patient stated she was not diabetic and does not check sugar at home or take any medication for diabetes at home. Will continue to monitor patient's CBG.

## 2022-05-11 NOTE — Interval H&P Note (Signed)
History and Physical Interval Note:  05/11/2022 12:16 PM  Andrea Leblanc  has presented today for surgery, with the diagnosis of Plantar flexed metatarsal, right Bony exostosis, right.  The various methods of treatment have been discussed with the patient and family. After consideration of risks, benefits and other options for treatment, the patient has consented to  Procedure(s): METATARSAL OSTEOTOMY (Right) TARSAL EXOSTECTECTOMY TOE (Right) as a surgical intervention.  The patient's history has been reviewed, patient examined, no change in status, stable for surgery.  I have reviewed the patient's chart and labs.  Questions were answered to the patient's satisfaction.     Andrea Leblanc

## 2022-05-11 NOTE — Progress Notes (Signed)
Patient had eaten chicken and green beans this morning at 0600 and came in chewing gum.  Dr. Ermalene Postin made aware.    Patient declined to answer my final questions about discharge planning.

## 2022-05-12 ENCOUNTER — Encounter (HOSPITAL_COMMUNITY): Payer: Self-pay | Admitting: Podiatry

## 2022-05-12 DIAGNOSIS — Z6826 Body mass index (BMI) 26.0-26.9, adult: Secondary | ICD-10-CM | POA: Diagnosis not present

## 2022-05-12 DIAGNOSIS — M25571 Pain in right ankle and joints of right foot: Secondary | ICD-10-CM | POA: Diagnosis not present

## 2022-05-12 DIAGNOSIS — E663 Overweight: Secondary | ICD-10-CM | POA: Diagnosis not present

## 2022-05-12 DIAGNOSIS — K047 Periapical abscess without sinus: Secondary | ICD-10-CM | POA: Diagnosis not present

## 2022-05-12 NOTE — Anesthesia Postprocedure Evaluation (Signed)
Anesthesia Post Note  Patient: Andrea Leblanc  Procedure(s) Performed: METATARSAL OSTEOTOMY (Right: Toe) TARSAL EXOSTECTECTOMY TOE (Right: Foot)     Patient location during evaluation: PACU Anesthesia Type: MAC Level of consciousness: awake and alert Pain management: pain level controlled Vital Signs Assessment: post-procedure vital signs reviewed and stable Respiratory status: spontaneous breathing, nonlabored ventilation, respiratory function stable and patient connected to nasal cannula oxygen Cardiovascular status: stable and blood pressure returned to baseline Postop Assessment: no apparent nausea or vomiting Anesthetic complications: no  No notable events documented.  Last Vitals:  Vitals:   05/11/22 1645 05/11/22 1700  BP: (!) 142/79 (!) 159/76  Pulse: 71 63  Resp: 18 15  Temp:  36.8 C  SpO2: 100% 100%    Last Pain:  Vitals:   05/11/22 1700  TempSrc:   PainSc: 3                  Tiajuana Amass

## 2022-05-15 ENCOUNTER — Encounter: Payer: Self-pay | Admitting: Podiatry

## 2022-05-15 NOTE — Op Note (Signed)
Surgeon: Surgeon(s): [name], DPM  Assistants: None Pre-operative diagnosis: Plantar flexed metatarsal, right Bony exostosis, right  Post-operative diagnosis: same Procedure: Procedure(s) (LRB): METATARSAL OSTEOTOMY (Right) TARSAL EXOSTECTECTOMY TOE (Right)  Pathology: * No specimens in log *  Pertinent Intra-op findings: Plantarflexed fifth metatarsal noted right and dorsal midfoot exostosis noted* Anesthesia: Monitor Anesthesia Care  Hemostasis:  Total Tourniquet Time Documented: Calf (Right) - 13 minutes Total: Calf (Right) - 13 minutes  EBL: Minimal Materials: 3-0 Prolene and 3-0 Monocryl Injectables: 10 cc of half percent Marcaine plain half percent lidocaine plain Complications: None  Indications for surgery: A 72 y.o. female presents with right painful plantarflexed fifth metatarsal and right dorsal midfoot exostosis. Patient has failed all conservative therapy including but not limited to shoe gear modification padding protecting offloading. He wishes to have surgical correction of the foot/deformity. It was determined that patient would benefit from right fifth metatarsal floating osteotomy with dorsal midfoot exostectomy Informed surgical risk consent was reviewed and read aloud to the patient.  I reviewed the films.  I have discussed my findings with the patient in great detail.  I have discussed all risks including but not limited to infection, stiffness, scarring, limp, disability, deformity, damage to blood vessels and nerves, numbness, poor healing, need for braces, arthritis, chronic pain, amputation, death.  All benefits and realistic expectations discussed in great detail.  I have made no promises as to the outcome.  I have provided realistic expectations.  I have offered the patient a 2nd opinion, which they have declined and assured me they preferred to proceed despite the risks   Procedure in detail: The patient was both verbally and visually identified by  myself, the nursing staff, and anesthesia staff in the preoperative holding area. They were then transferred to the operating room and placed on the operative table in supine position.  Attention was directed to the right dorsal lateral foot, a skin marker used to delineate the incision dorsal lateral about the fifth metatarsal.  Using #15 mid incision was carried down from epidermal dermal junction down to the level of the metatarsal.  Some periosteum was reflected to allow for sagittal saw cut.  Using sagittal saw close a reverse Wilson osteotomy was performed in standard technique.  No screw fixation showed indicated as this will be a floating osteotomy.  Reduction of submet 5 plantar pressure noted.  The incision was closed with 3-0 Monocryl.  Attention was directed to the right dorsal midfoot longitudinal incision was delineated using skin marker.  Using #15 mid incision was carried down from epidermal dermal junction down to the level of the bone.  All neurovascular structures were carefully retracted.  Exostectomy was resected using sagittal saw and rasp.  At this time reduction of dorsal exostosis noted.  Bone wax was used to prevent recurrence.  The wound was primarily closed with 3-0 Monocryl 3-0 Prolene.  All bony prominences were adequately padded the incisions were dressed with Xeroform Kerlix Ace bandage 4 x 4 gauze.  At the conclusion of the procedure the patient was awoken from anesthesia and found to have tolerated the procedure well any complications. There were transferred to PACU with vital signs stable and vascular status intact.  Boneta Lucks, DPM

## 2022-05-16 DIAGNOSIS — R159 Full incontinence of feces: Secondary | ICD-10-CM | POA: Diagnosis not present

## 2022-05-17 ENCOUNTER — Ambulatory Visit (INDEPENDENT_AMBULATORY_CARE_PROVIDER_SITE_OTHER): Payer: Medicare HMO | Admitting: Podiatry

## 2022-05-17 ENCOUNTER — Ambulatory Visit (INDEPENDENT_AMBULATORY_CARE_PROVIDER_SITE_OTHER): Payer: Medicare HMO

## 2022-05-17 DIAGNOSIS — M216X1 Other acquired deformities of right foot: Secondary | ICD-10-CM | POA: Diagnosis not present

## 2022-05-17 DIAGNOSIS — M3219 Other organ or system involvement in systemic lupus erythematosus: Secondary | ICD-10-CM | POA: Diagnosis not present

## 2022-05-17 DIAGNOSIS — Z9889 Other specified postprocedural states: Secondary | ICD-10-CM

## 2022-05-17 NOTE — Progress Notes (Signed)
Subjective:  Patient ID: Andrea Leblanc, female    DOB: [birth year],  MRN: [id number]  No chief complaint on file.   DOS: 05/11/2022 Procedure: Right fifth metatarsal osteotomy with right midfoot exostectomy  72 y.o. female returns for post-op check.  Patient states that she is doing well.  No acute complaints no strikethrough bandages clean dry and intact weightbearing as tolerated with Cam boot  Review of Systems: Negative except as noted in the HPI. Denies N/V/F/Ch.  Past Medical History:  Diagnosis Date   Anemia    Asthma    Chronic kidney disease    Cough variant asthma    Diabetes mellitus, type II (HCC)    Hammer toe    Uterine fibroid     Current Outpatient Medications:    amLODipine (NORVASC) 5 MG tablet, Take 5 mg by mouth daily., Disp: , Rfl:    amoxicillin (AMOXIL) 875 MG tablet, Take 875 mg by mouth 2 (two) times daily as needed (abscess)., Disp: , Rfl:    Darbepoetin Alfa (ARANESP) 100 MCG/0.5ML SOSY injection, Inject 200 mcg into the skin every 6 (six) weeks., Disp: , Rfl:    DULoxetine (CYMBALTA) 60 MG capsule, Take 60 mg by mouth daily., Disp: , Rfl:    epoetin alfa-epbx (RETACRIT) 09811 UNIT/ML injection, 20,000 Units every 30 (thirty) days., Disp: , Rfl:    furosemide (LASIX) 40 MG tablet, Take 40 mg by mouth daily as needed for edema., Disp: , Rfl:    gabapentin (NEURONTIN) 800 MG tablet, Take 800 mg by mouth 3 (three) times daily. , Disp: , Rfl:    hydroxychloroquine (PLAQUENIL) 200 MG tablet, Take 300 mg by mouth daily., Disp: , Rfl:    ibuprofen (ADVIL) 800 MG tablet, Take 1 tablet (800 mg total) by mouth every 6 (six) hours as needed., Disp: 60 tablet, Rfl: 1   oxyCODONE-acetaminophen (PERCOCET) 5-325 MG tablet, Take 1 tablet by mouth every 4 (four) hours as needed for severe pain., Disp: 30 tablet, Rfl: 0   predniSONE (DELTASONE) 1 MG tablet, Take 2 mg by mouth daily. Take with 5 mg to equal 7 mg daily, Disp: , Rfl:    predniSONE (DELTASONE) 5 MG tablet,  Take 5 mg by mouth daily. Take with 2 mg to equal 7 mg daily, Disp: , Rfl:    traMADol (ULTRAM) 50 MG tablet, Take 25-50 mg by mouth 2 (two) times daily as needed for moderate pain., Disp: , Rfl:    triamcinolone cream (KENALOG) 0.1 %, Apply 1 application  topically daily as needed (itching)., Disp: , Rfl:   Social History   Tobacco Use  Smoking Status Former   Types: Cigarettes   Quit date: 01/31/1986   Years since quitting: 36.3  Smokeless Tobacco Never  Tobacco Comments   pt unsure of how long or how much she smoked betofre quitting.     Allergies  Allergen Reactions   Lyrica [Pregabalin] Swelling   Objective:  There were no vitals filed for this visit. There is no height or weight on file to calculate BMI. Constitutional Well developed. Well nourished.  Vascular Foot warm and well perfused. Capillary refill normal to all digits.   Neurologic Normal speech. Oriented to person, place, and time. Epicritic sensation to light touch grossly present bilaterally.  Dermatologic Skin healing well without signs of infection. Skin edges well coapted without signs of infection.  Orthopedic: Tenderness to palpation noted about the surgical site.   Radiographs: 3 views of skeletally mature the right foot: Exostectomy of  the dorsal midfoot noted, floating osteotomy of the fifth metatarsal noted.  Good reduction of submet 5 pressure noted.  No other bony abnormalities identified Assessment:   1. Plantar flexed metatarsal, right   2. Status post foot surgery    Plan:  Patient was evaluated and treated and all questions answered.  S/p foot surgery right -Progressing as expected post-operatively. -XR: See above -WB Status: Weightbearing as tolerated in cam boot -Sutures: Intact.  No signs of Deis is no complication noted -Medications: None -Foot redressed.  No follow-ups on file.

## 2022-05-24 ENCOUNTER — Encounter (HOSPITAL_COMMUNITY): Admission: RE | Admit: 2022-05-24 | Payer: Medicare HMO | Source: Ambulatory Visit

## 2022-06-01 ENCOUNTER — Ambulatory Visit (INDEPENDENT_AMBULATORY_CARE_PROVIDER_SITE_OTHER): Payer: Medicare HMO

## 2022-06-01 DIAGNOSIS — M216X1 Other acquired deformities of right foot: Secondary | ICD-10-CM

## 2022-06-01 NOTE — Progress Notes (Signed)
Patient presents today for post op visit # 2, patient of Dr. Allena Katz.  POV #2 DOS 05/11/2022 RT 5TH METATARSAL OSTEOTOMY FLOATING & RT MIDFOOT EXOSTECTOM    POV # 2 DOS 05/11/22    Sutures removed today.  Incisions look good and no signs of infections. Patient states she is in a lot of pain when I try to remove sutures. Lidocraine cream was applied. Pt tolerated removal well after application. Pt was given a new post op shoe, shoe given at hospital was falling apart.    Reviewed icing and elevation. Patient will follow up with Dr. Allena Katz  for POV# 3.

## 2022-06-05 DIAGNOSIS — R531 Weakness: Secondary | ICD-10-CM | POA: Diagnosis not present

## 2022-06-05 DIAGNOSIS — Z7952 Long term (current) use of systemic steroids: Secondary | ICD-10-CM | POA: Diagnosis not present

## 2022-06-05 DIAGNOSIS — R159 Full incontinence of feces: Secondary | ICD-10-CM | POA: Diagnosis not present

## 2022-06-05 DIAGNOSIS — M329 Systemic lupus erythematosus, unspecified: Secondary | ICD-10-CM | POA: Diagnosis not present

## 2022-06-05 DIAGNOSIS — M533 Sacrococcygeal disorders, not elsewhere classified: Secondary | ICD-10-CM | POA: Diagnosis not present

## 2022-06-05 DIAGNOSIS — N189 Chronic kidney disease, unspecified: Secondary | ICD-10-CM | POA: Diagnosis not present

## 2022-06-14 ENCOUNTER — Ambulatory Visit (INDEPENDENT_AMBULATORY_CARE_PROVIDER_SITE_OTHER): Payer: Medicare HMO | Admitting: Podiatry

## 2022-06-14 ENCOUNTER — Encounter: Payer: Medicare HMO | Admitting: Podiatry

## 2022-06-14 ENCOUNTER — Ambulatory Visit (INDEPENDENT_AMBULATORY_CARE_PROVIDER_SITE_OTHER): Payer: Medicare HMO

## 2022-06-14 DIAGNOSIS — M79672 Pain in left foot: Secondary | ICD-10-CM | POA: Diagnosis not present

## 2022-06-14 DIAGNOSIS — M216X1 Other acquired deformities of right foot: Secondary | ICD-10-CM

## 2022-06-14 DIAGNOSIS — Z9889 Other specified postprocedural states: Secondary | ICD-10-CM | POA: Diagnosis not present

## 2022-06-14 DIAGNOSIS — M79671 Pain in right foot: Secondary | ICD-10-CM | POA: Diagnosis not present

## 2022-06-14 NOTE — Progress Notes (Signed)
Subjective:  Patient ID: Andrea Leblanc, female    DOB: 07-13-1950,  MRN: [id number]  Chief Complaint  Patient presents with   Routine Post Op    DOS: 05/11/2022 Procedure: Right fifth metatarsal osteotomy with right midfoot exostectomy  72 y.o. female returns for post-op check.  Patient states that she is doing well.  No acute complaints no strikethrough bandages clean dry and intact weightbearing as tolerated with Cam boot  Review of Systems: Negative except as noted in the HPI. Denies N/V/F/Ch.  Past Medical History:  Diagnosis Date   Anemia    Asthma    Chronic kidney disease    Cough variant asthma    Diabetes mellitus, type II (HCC)    Hammer toe    Uterine fibroid     Current Outpatient Medications:    amLODipine (NORVASC) 5 MG tablet, Take 5 mg by mouth daily., Disp: , Rfl:    amoxicillin (AMOXIL) 875 MG tablet, Take 875 mg by mouth 2 (two) times daily as needed (abscess)., Disp: , Rfl:    Darbepoetin Alfa (ARANESP) 100 MCG/0.5ML SOSY injection, Inject 200 mcg into the skin every 6 (six) weeks., Disp: , Rfl:    DULoxetine (CYMBALTA) 60 MG capsule, Take 60 mg by mouth daily., Disp: , Rfl:    epoetin alfa-epbx (RETACRIT) 40981 UNIT/ML injection, 20,000 Units every 30 (thirty) days., Disp: , Rfl:    furosemide (LASIX) 40 MG tablet, Take 40 mg by mouth daily as needed for edema., Disp: , Rfl:    gabapentin (NEURONTIN) 800 MG tablet, Take 800 mg by mouth 3 (three) times daily. , Disp: , Rfl:    hydroxychloroquine (PLAQUENIL) 200 MG tablet, Take 300 mg by mouth daily., Disp: , Rfl:    ibuprofen (ADVIL) 800 MG tablet, Take 1 tablet (800 mg total) by mouth every 6 (six) hours as needed., Disp: 60 tablet, Rfl: 1   oxyCODONE-acetaminophen (PERCOCET) 5-325 MG tablet, Take 1 tablet by mouth every 4 (four) hours as needed for severe pain., Disp: 30 tablet, Rfl: 0   predniSONE (DELTASONE) 1 MG tablet, Take 2 mg by mouth daily. Take with 5 mg to equal 7 mg daily, Disp: , Rfl:     predniSONE (DELTASONE) 5 MG tablet, Take 5 mg by mouth daily. Take with 2 mg to equal 7 mg daily, Disp: , Rfl:    traMADol (ULTRAM) 50 MG tablet, Take 25-50 mg by mouth 2 (two) times daily as needed for moderate pain., Disp: , Rfl:    triamcinolone cream (KENALOG) 0.1 %, Apply 1 application  topically daily as needed (itching)., Disp: , Rfl:   Social History   Tobacco Use  Smoking Status Former   Types: Cigarettes   Quit date: 01/31/1986   Years since quitting: 36.4  Smokeless Tobacco Never  Tobacco Comments   pt unsure of how long or how much she smoked betofre quitting.     Allergies  Allergen Reactions   Lyrica [Pregabalin] Swelling   Objective:  There were no vitals filed for this visit. There is no height or weight on file to calculate BMI. Constitutional Well developed. Well nourished.  Vascular Foot warm and well perfused. Capillary refill normal to all digits.   Neurologic Normal speech. Oriented to person, place, and time. Epicritic sensation to light touch grossly present bilaterally.  Dermatologic Skin completely epithelialized.  Reduction of deformity noted.  Reduction of submetatarsal 5 noted  Orthopedic: No further tenderness to palpation noted about the surgical site.   Radiographs: 3 views  of skeletally mature the right foot: Exostectomy of the dorsal midfoot noted, floating osteotomy of the fifth metatarsal noted.  Good reduction of submet 5 pressure noted.  No other bony abnormalities identified Assessment:   1. Status post foot surgery    Plan:  Patient was evaluated and treated and all questions answered.  S/p foot surgery right -Clinically healed and officially discharged from my care if any foot and ankle issues on future she will come back and see me.  She states understanding  No follow-ups on file.

## 2022-06-27 ENCOUNTER — Other Ambulatory Visit (HOSPITAL_COMMUNITY): Payer: Self-pay | Admitting: Internal Medicine

## 2022-06-27 DIAGNOSIS — Z1231 Encounter for screening mammogram for malignant neoplasm of breast: Secondary | ICD-10-CM

## 2022-06-29 ENCOUNTER — Ambulatory Visit (HOSPITAL_COMMUNITY): Payer: Medicare HMO

## 2022-07-26 ENCOUNTER — Ambulatory Visit (HOSPITAL_COMMUNITY)
Admission: RE | Admit: 2022-07-26 | Discharge: 2022-07-26 | Disposition: A | Discharge status: Home / Self Care | Payer: Medicare HMO | Source: Ambulatory Visit | Attending: Internal Medicine | Admitting: Internal Medicine

## 2022-07-26 DIAGNOSIS — N189 Chronic kidney disease, unspecified: Secondary | ICD-10-CM | POA: Diagnosis not present

## 2022-07-26 DIAGNOSIS — Z1231 Encounter for screening mammogram for malignant neoplasm of breast: Secondary | ICD-10-CM | POA: Diagnosis not present

## 2022-08-08 ENCOUNTER — Other Ambulatory Visit (HOSPITAL_COMMUNITY): Payer: Self-pay | Admitting: Internal Medicine

## 2022-08-08 DIAGNOSIS — R159 Full incontinence of feces: Secondary | ICD-10-CM

## 2022-08-08 DIAGNOSIS — M533 Sacrococcygeal disorders, not elsewhere classified: Secondary | ICD-10-CM

## 2022-08-08 DIAGNOSIS — K220 Achalasia of cardia: Secondary | ICD-10-CM | POA: Diagnosis not present

## 2022-08-09 DIAGNOSIS — N182 Chronic kidney disease, stage 2 (mild): Secondary | ICD-10-CM | POA: Diagnosis not present

## 2022-08-09 DIAGNOSIS — I10 Essential (primary) hypertension: Secondary | ICD-10-CM | POA: Diagnosis not present

## 2022-08-16 ENCOUNTER — Encounter: Payer: Self-pay | Admitting: Podiatry

## 2022-08-28 DIAGNOSIS — E7800 Pure hypercholesterolemia, unspecified: Secondary | ICD-10-CM | POA: Diagnosis not present

## 2022-08-28 DIAGNOSIS — M069 Rheumatoid arthritis, unspecified: Secondary | ICD-10-CM | POA: Diagnosis not present

## 2022-08-28 DIAGNOSIS — Z1339 Encounter for screening examination for other mental health and behavioral disorders: Secondary | ICD-10-CM | POA: Diagnosis not present

## 2022-08-28 DIAGNOSIS — Z299 Encounter for prophylactic measures, unspecified: Secondary | ICD-10-CM | POA: Diagnosis not present

## 2022-08-28 DIAGNOSIS — I10 Essential (primary) hypertension: Secondary | ICD-10-CM | POA: Diagnosis not present

## 2022-08-28 DIAGNOSIS — Z1331 Encounter for screening for depression: Secondary | ICD-10-CM | POA: Diagnosis not present

## 2022-08-28 DIAGNOSIS — Z7189 Other specified counseling: Secondary | ICD-10-CM | POA: Diagnosis not present

## 2022-08-28 DIAGNOSIS — Z0000 Encounter for general adult medical examination without abnormal findings: Secondary | ICD-10-CM | POA: Diagnosis not present

## 2022-08-28 DIAGNOSIS — R5383 Other fatigue: Secondary | ICD-10-CM | POA: Diagnosis not present

## 2022-08-29 DIAGNOSIS — Z79899 Other long term (current) drug therapy: Secondary | ICD-10-CM | POA: Diagnosis not present

## 2022-08-29 DIAGNOSIS — R5383 Other fatigue: Secondary | ICD-10-CM | POA: Diagnosis not present

## 2022-08-29 DIAGNOSIS — E7800 Pure hypercholesterolemia, unspecified: Secondary | ICD-10-CM | POA: Diagnosis not present

## 2022-08-31 DIAGNOSIS — Z780 Asymptomatic menopausal state: Secondary | ICD-10-CM | POA: Diagnosis not present

## 2022-08-31 DIAGNOSIS — Z13820 Encounter for screening for osteoporosis: Secondary | ICD-10-CM | POA: Diagnosis not present

## 2022-08-31 DIAGNOSIS — N189 Chronic kidney disease, unspecified: Secondary | ICD-10-CM | POA: Diagnosis not present

## 2022-08-31 DIAGNOSIS — Z0189 Encounter for other specified special examinations: Secondary | ICD-10-CM | POA: Diagnosis not present

## 2022-08-31 DIAGNOSIS — M329 Systemic lupus erythematosus, unspecified: Secondary | ICD-10-CM | POA: Diagnosis not present

## 2022-08-31 DIAGNOSIS — R159 Full incontinence of feces: Secondary | ICD-10-CM | POA: Diagnosis not present

## 2022-08-31 DIAGNOSIS — J849 Interstitial pulmonary disease, unspecified: Secondary | ICD-10-CM | POA: Diagnosis not present

## 2022-09-06 ENCOUNTER — Other Ambulatory Visit: Payer: Self-pay

## 2022-10-06 DIAGNOSIS — D472 Monoclonal gammopathy: Secondary | ICD-10-CM | POA: Diagnosis not present

## 2022-10-20 DIAGNOSIS — M533 Sacrococcygeal disorders, not elsewhere classified: Secondary | ICD-10-CM | POA: Diagnosis not present

## 2022-10-20 DIAGNOSIS — R159 Full incontinence of feces: Secondary | ICD-10-CM | POA: Diagnosis not present

## 2022-11-03 DIAGNOSIS — N189 Chronic kidney disease, unspecified: Secondary | ICD-10-CM | POA: Diagnosis not present

## 2022-11-06 ENCOUNTER — Telehealth: Payer: Self-pay | Admitting: Pulmonary Disease

## 2022-11-06 IMAGING — MG MM DIGITAL SCREENING BILAT W/ TOMO AND CAD
6 of 12 series · 6 of 36 positions shown · non-contrast
Comparison: Previous exam(s).

CLINICAL DATA: Screening.

EXAM:
DIGITAL SCREENING BILATERAL MAMMOGRAM WITH TOMOSYNTHESIS AND CAD
TECHNIQUE: Bilateral screening digital craniocaudal and mediolateral oblique
mammograms were obtained. Bilateral screening digital breast
tomosynthesis was performed. The images were evaluated with
computer-aided detection.

[L CC synth-2D]
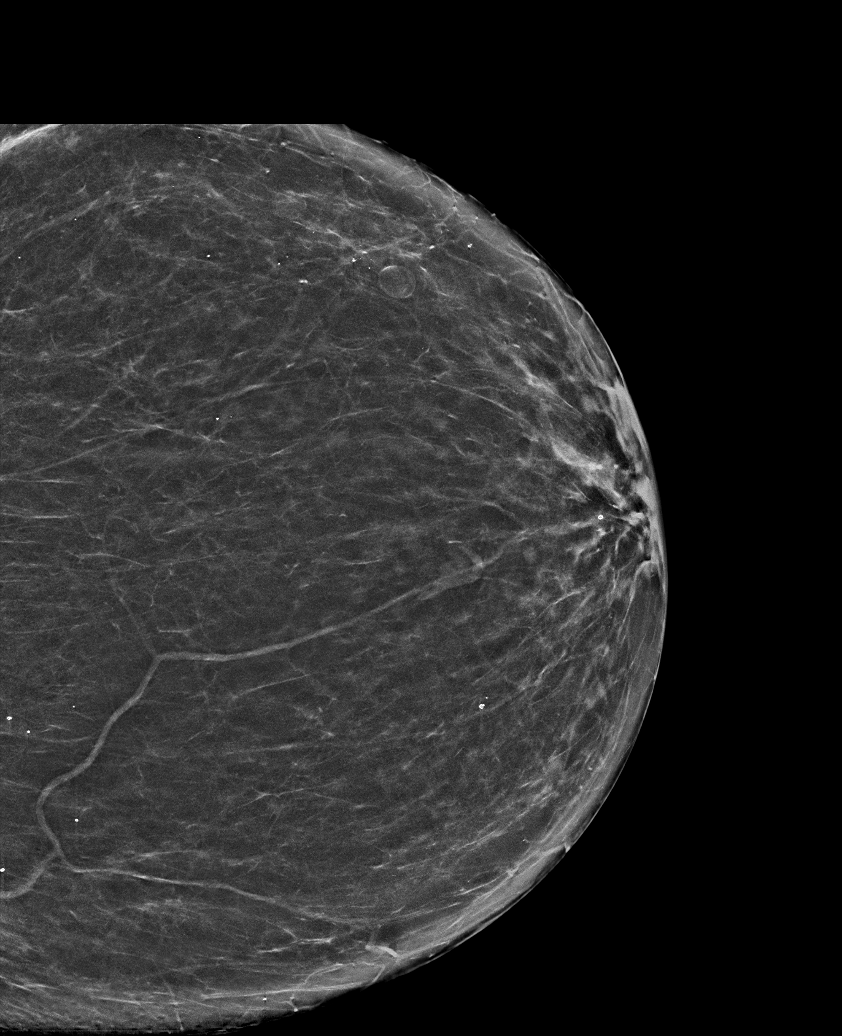

[R MLO synth-2D (1 of 2)]
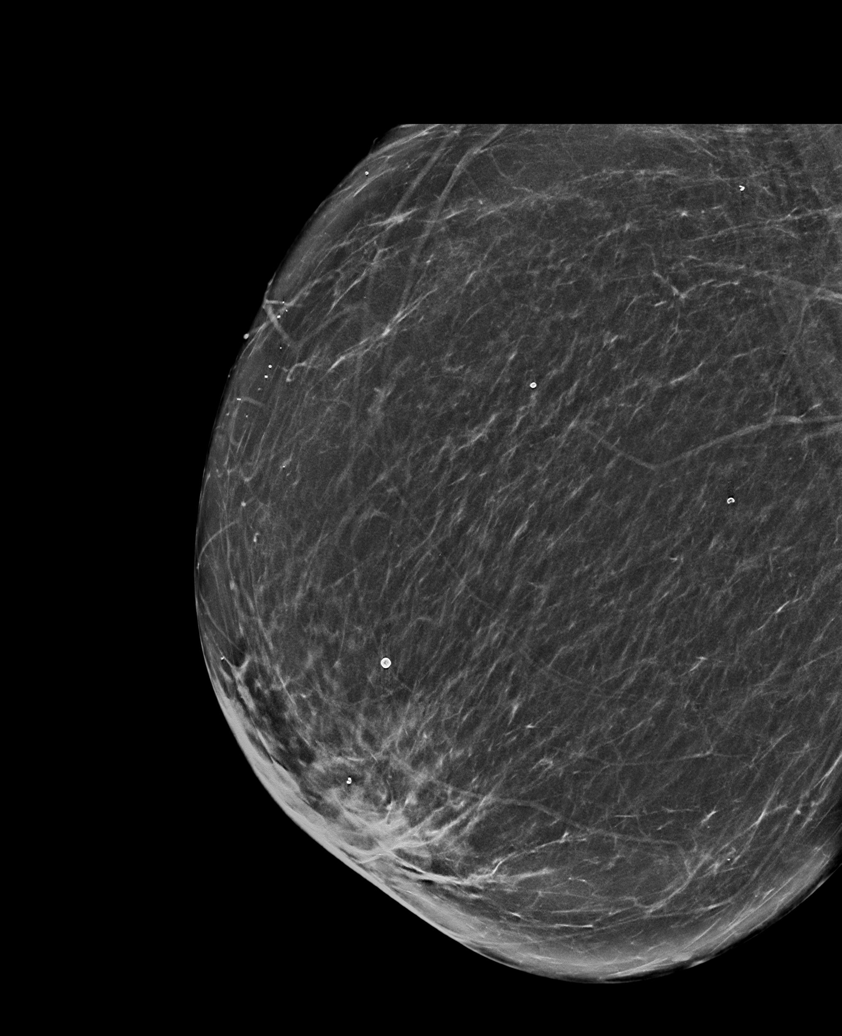

[L MLO synth-2D (1 of 2)]
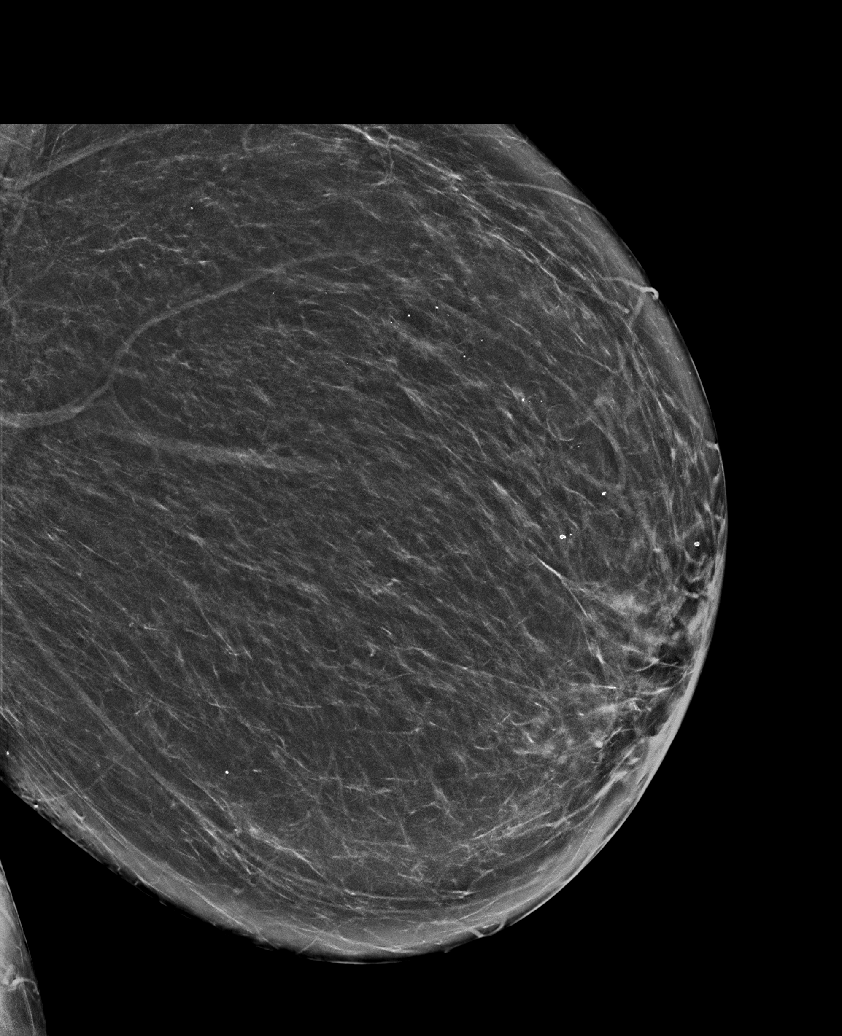

[R MLO synth-2D (2 of 2)]
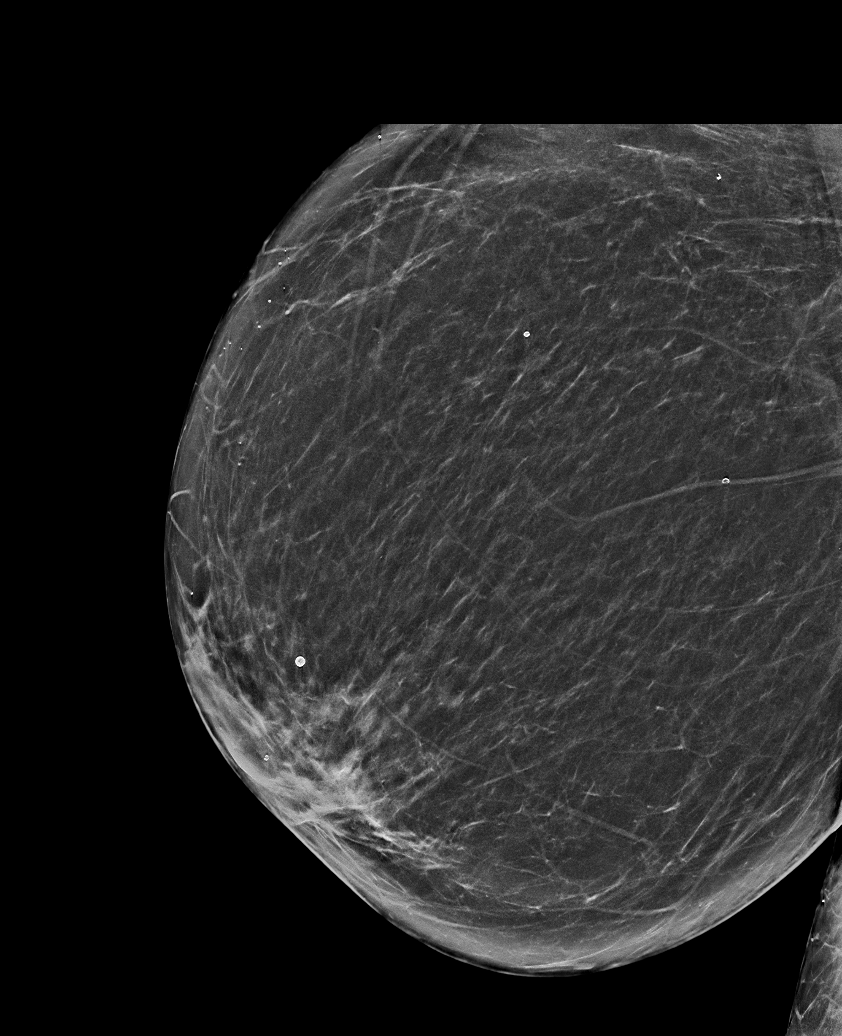

[L MLO synth-2D (2 of 2)]
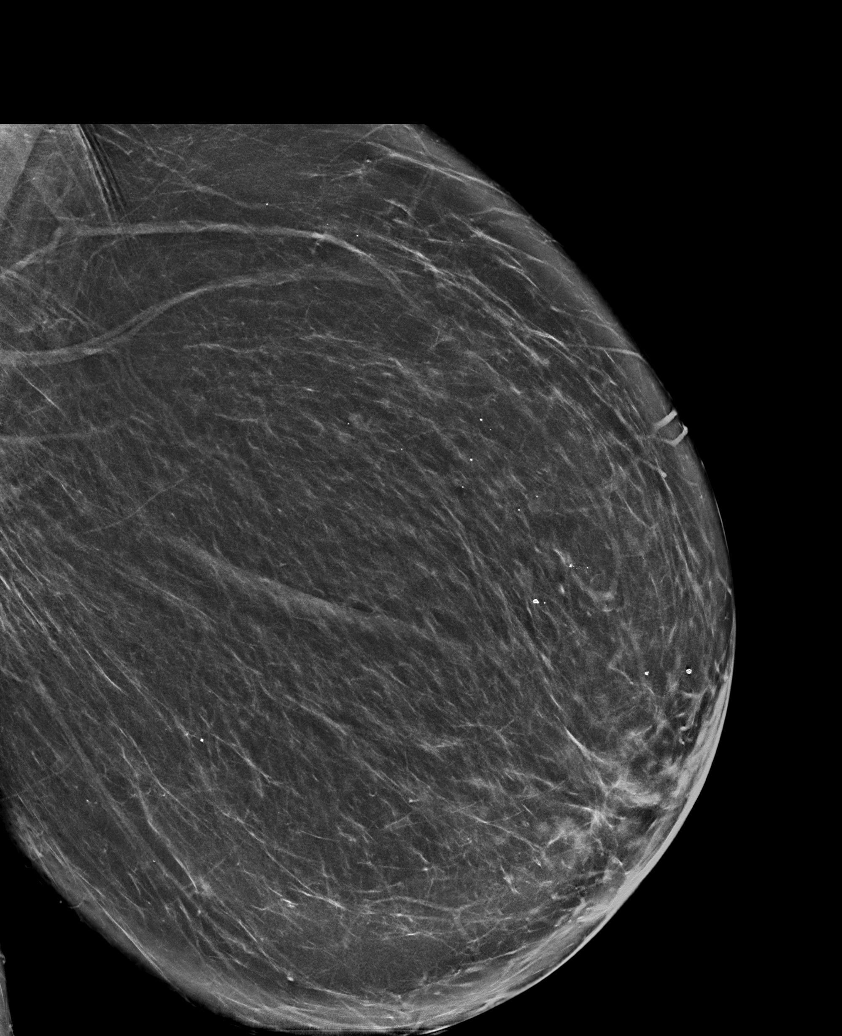

[R CC synth-2D]
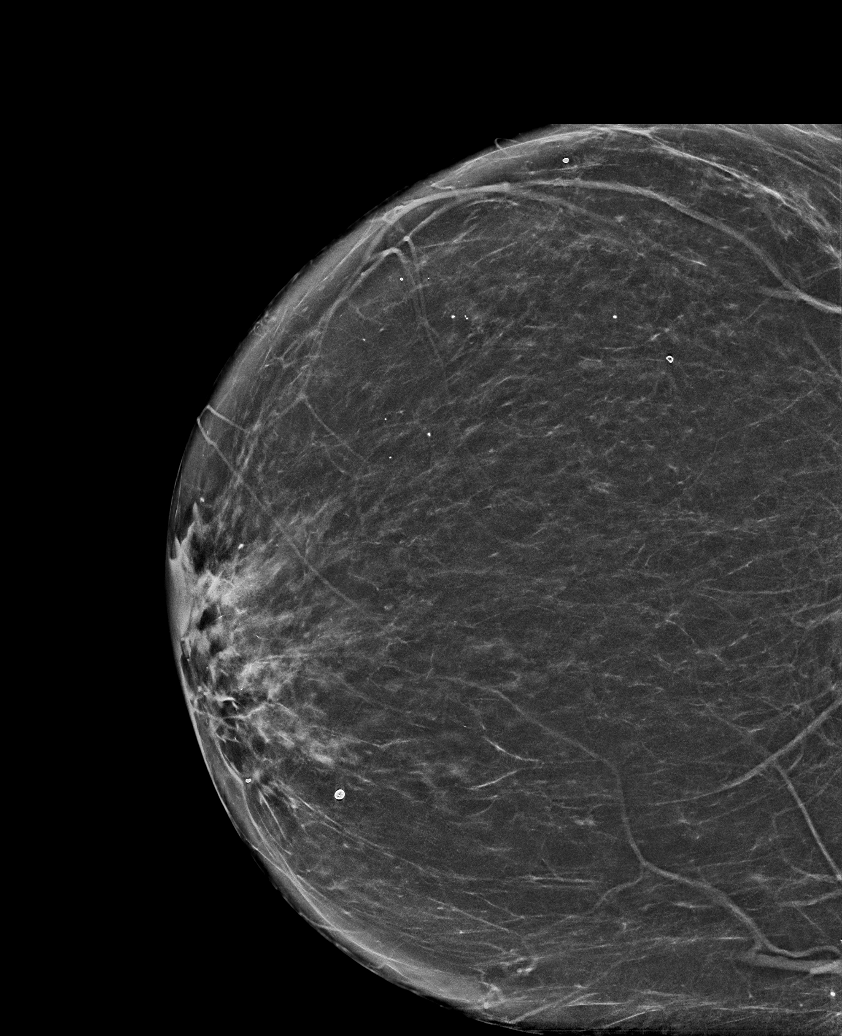

[6 of 36 positions shown; findings below may reference images not displayed]

ACR Breast Density Category b: There are scattered areas of
fibroglandular density.
FINDINGS: There are no findings suspicious for malignancy. The images were
evaluated with computer-aided detection.
IMPRESSION: No mammographic evidence of malignancy. A result letter of this
screening mammogram will be mailed directly to the patient.

RECOMMENDATION:
Screening mammogram in one year. (Code:WJ-I-BG6)

BI-RADS CATEGORY  1: Negative.

## 2022-11-06 NOTE — Telephone Encounter (Signed)
Andrea Leblanc was called to schedule her followup with Dr Everardo All. Andrea Leblanc could not remember who JE was even after giving our office details and her last visit date. Andrea Leblanc declined to schedule as she stated she was doing fine. Nothing further needed.

## 2022-11-07 DIAGNOSIS — M069 Rheumatoid arthritis, unspecified: Secondary | ICD-10-CM | POA: Diagnosis not present

## 2022-11-07 DIAGNOSIS — J8410 Pulmonary fibrosis, unspecified: Secondary | ICD-10-CM | POA: Diagnosis not present

## 2022-11-07 DIAGNOSIS — R159 Full incontinence of feces: Secondary | ICD-10-CM | POA: Diagnosis not present

## 2022-11-15 ENCOUNTER — Telehealth: Payer: Self-pay

## 2022-11-15 ENCOUNTER — Other Ambulatory Visit: Payer: Self-pay

## 2022-11-15 NOTE — Telephone Encounter (Signed)
Auth Submission: APPROVED Site of care: Site of care: [facility] Payer: [employer] Medication & CPT/J Code(s) submitted: Feraheme (ferumoxytol) [id number] Route of submission (phone, fax, portal): portal Phone # Fax # Auth type: Buy/Bill PB Units/visits requested: 2 doses, weekly Reference number: [id number] Approval from: 03/03/21 to 02/20/23

## 2022-11-19 DIAGNOSIS — Z1212 Encounter for screening for malignant neoplasm of rectum: Secondary | ICD-10-CM | POA: Diagnosis not present

## 2022-11-19 DIAGNOSIS — Z1211 Encounter for screening for malignant neoplasm of colon: Secondary | ICD-10-CM | POA: Diagnosis not present

## 2022-11-24 ENCOUNTER — Encounter: Payer: Medicare HMO | Attending: Internal Medicine | Admitting: *Deleted

## 2022-11-24 VITALS — BP 130/75 | HR 84 | Temp 97.7°F | Resp 16

## 2022-11-24 DIAGNOSIS — D631 Anemia in chronic kidney disease: Secondary | ICD-10-CM | POA: Diagnosis not present

## 2022-11-24 DIAGNOSIS — N183 Chronic kidney disease, stage 3 unspecified: Secondary | ICD-10-CM | POA: Diagnosis not present

## 2022-11-24 MED ORDER — ACETAMINOPHEN 325 MG PO TABS
650.0000 mg | ORAL_TABLET | Freq: Once | ORAL | Status: AC
  Administered 2022-11-24: 650 mg via ORAL
  Filled 2022-11-24: qty 2

## 2022-11-24 MED ORDER — SODIUM CHLORIDE 0.9 % IV SOLN
510.0000 mg | Freq: Once | INTRAVENOUS | Status: AC
  Administered 2022-11-24: 510 mg via INTRAVENOUS
  Filled 2022-11-24: qty 17

## 2022-11-24 MED ORDER — DIPHENHYDRAMINE HCL 25 MG PO CAPS
25.0000 mg | ORAL_CAPSULE | Freq: Once | ORAL | Status: AC
  Administered 2022-11-24: 25 mg via ORAL
  Filled 2022-11-24: qty 1

## 2022-11-24 NOTE — Progress Notes (Signed)
Diagnosis: Iron Deficiency Anemia  Provider:   Estill Bakes, MD  Procedure: IV Infusion  IV Type: Peripheral, IV Location: L Antecubital  Feraheme (Ferumoxytol), Dose: 510 mg  Infusion Start Time: 1306  Infusion Stop Time: 1325  Post Infusion IV Care: Observation period completed  Discharge: Condition: Good, Destination: Home . AVS Provided  Performed by:  Daleen Squibb, RN

## 2022-11-26 LAB — COLOGUARD: COLOGUARD: NEGATIVE

## 2022-11-27 DIAGNOSIS — H524 Presbyopia: Secondary | ICD-10-CM | POA: Diagnosis not present

## 2022-11-27 DIAGNOSIS — H35371 Puckering of macula, right eye: Secondary | ICD-10-CM | POA: Diagnosis not present

## 2022-11-30 DIAGNOSIS — R531 Weakness: Secondary | ICD-10-CM | POA: Diagnosis not present

## 2022-11-30 DIAGNOSIS — E2749 Other adrenocortical insufficiency: Secondary | ICD-10-CM | POA: Diagnosis not present

## 2022-11-30 DIAGNOSIS — M898X9 Other specified disorders of bone, unspecified site: Secondary | ICD-10-CM | POA: Diagnosis not present

## 2022-11-30 DIAGNOSIS — M329 Systemic lupus erythematosus, unspecified: Secondary | ICD-10-CM | POA: Diagnosis not present

## 2022-11-30 DIAGNOSIS — M79672 Pain in left foot: Secondary | ICD-10-CM | POA: Diagnosis not present

## 2022-11-30 DIAGNOSIS — M79671 Pain in right foot: Secondary | ICD-10-CM | POA: Diagnosis not present

## 2022-11-30 DIAGNOSIS — R159 Full incontinence of feces: Secondary | ICD-10-CM | POA: Diagnosis not present

## 2022-11-30 DIAGNOSIS — M8580 Other specified disorders of bone density and structure, unspecified site: Secondary | ICD-10-CM | POA: Diagnosis not present

## 2022-11-30 DIAGNOSIS — N189 Chronic kidney disease, unspecified: Secondary | ICD-10-CM | POA: Diagnosis not present

## 2022-12-01 ENCOUNTER — Encounter: Payer: Medicare HMO | Admitting: Internal Medicine

## 2022-12-01 VITALS — BP 163/81 | HR 60 | Temp 97.7°F | Resp 16

## 2022-12-01 DIAGNOSIS — D631 Anemia in chronic kidney disease: Secondary | ICD-10-CM

## 2022-12-01 DIAGNOSIS — N183 Chronic kidney disease, stage 3 unspecified: Secondary | ICD-10-CM

## 2022-12-01 MED ORDER — SODIUM CHLORIDE 0.9 % IV SOLN
510.0000 mg | Freq: Once | INTRAVENOUS | Status: AC
  Administered 2022-12-01: 510 mg via INTRAVENOUS
  Filled 2022-12-01: qty 17

## 2022-12-01 NOTE — Progress Notes (Signed)
Diagnosis: Iron Deficiency Anemia  Provider:   Tyler Pita, MD  Procedure: IV Infusion  IV Type: Peripheral, IV Location: R Hand  Feraheme (Ferumoxytol), Dose: 510 mg  Infusion Start Time: 1326  Infusion Stop Time: 1338  Post Infusion IV Care: Observation period completed  Discharge: Condition: Good, Destination: Home . AVS Provided  Performed by:  Feliberto Harts, LPN

## 2022-12-11 ENCOUNTER — Encounter: Payer: Self-pay | Admitting: Internal Medicine

## 2022-12-12 ENCOUNTER — Telehealth (INDEPENDENT_AMBULATORY_CARE_PROVIDER_SITE_OTHER): Payer: Self-pay | Admitting: *Deleted

## 2022-12-12 NOTE — Telephone Encounter (Signed)
Thanks Toniann Fail. This is actively being reviewed by the infusion center Thanks

## 2022-12-12 NOTE — Telephone Encounter (Signed)
Vm from [place] with [employer] resolution team calledasking to speak with office manger to discuss grievance patient reported on Dr. Levon Hedger. ( I do not see in chart where this patient has ever seen Dr. Levon Hedger)   Left number [phone] ext [phone] Reference # [id number]

## 2022-12-12 NOTE — Telephone Encounter (Signed)
I did call [employer] resolution team to let them know office manager is out of office til next week and to let them know I do no see where patient has seen Dr. Levon Hedger. And was told the date of incident was 11/24/22. I see she was seen at infusion clinic that day and Dr. Levon Hedger signed off on visit.

## 2022-12-13 ENCOUNTER — Ambulatory Visit (INDEPENDENT_AMBULATORY_CARE_PROVIDER_SITE_OTHER): Payer: Medicare HMO | Admitting: Podiatry

## 2022-12-13 ENCOUNTER — Encounter: Payer: Self-pay | Admitting: Podiatry

## 2022-12-13 DIAGNOSIS — M19071 Primary osteoarthritis, right ankle and foot: Secondary | ICD-10-CM

## 2022-12-13 DIAGNOSIS — M778 Other enthesopathies, not elsewhere classified: Secondary | ICD-10-CM | POA: Diagnosis not present

## 2022-12-13 MED ORDER — MELOXICAM 15 MG PO TABS: 15.0000 mg | ORAL_TABLET | Freq: Every day | ORAL | 0 refills | Status: AC

## 2022-12-13 NOTE — Progress Notes (Signed)
Subjective:  Patient ID: Andrea Leblanc, female    DOB: Aug 16, 1950,  MRN: [id number]  Chief Complaint  Patient presents with   Routine Post Op    PATIENT STATES THAT HER RF HAS NOT BEEN BETTER , AND WANTS IT TO GET BETTER, PATIENT STATES THAT HAS A BIG THING ON TOP AND IN THE MIDDLE OF HER RF , HER CALLOUSES IS NOT BETTER AND IT HAS BEEN SINCE 03/24    72 y.o. female presents with the above complaint.  Patient presents with complaint of right dorsal midfoot exostosis pain.  Patient states that started coming back and is hurting her.  She was doing good for a while.  She has not seen MRIs prior to seeing me denies any other acute complaints.   Review of Systems: Negative except as noted in the HPI. Denies N/V/F/Ch.  Past Medical History:  Diagnosis Date   Anemia    Asthma    Chronic kidney disease    Cough variant asthma    Diabetes mellitus, type II (HCC)    Hammer toe    Uterine fibroid     Current Outpatient Medications:    amLODipine (NORVASC) 5 MG tablet, Take 5 mg by mouth daily., Disp: , Rfl:    amoxicillin (AMOXIL) 875 MG tablet, Take 875 mg by mouth 2 (two) times daily as needed (abscess)., Disp: , Rfl:    Darbepoetin Alfa (ARANESP) 100 MCG/0.5ML SOSY injection, Inject 200 mcg into the skin every 6 (six) weeks., Disp: , Rfl:    DULoxetine (CYMBALTA) 60 MG capsule, Take 60 mg by mouth daily., Disp: , Rfl:    epoetin alfa-epbx (RETACRIT) 32440 UNIT/ML injection, 20,000 Units every 30 (thirty) days., Disp: , Rfl:    furosemide (LASIX) 40 MG tablet, Take 40 mg by mouth daily as needed for edema., Disp: , Rfl:    gabapentin (NEURONTIN) 800 MG tablet, Take 800 mg by mouth 3 (three) times daily. , Disp: , Rfl:    hydroxychloroquine (PLAQUENIL) 200 MG tablet, Take 300 mg by mouth daily., Disp: , Rfl:    ibuprofen (ADVIL) 800 MG tablet, Take 1 tablet (800 mg total) by mouth every 6 (six) hours as needed., Disp: 60 tablet, Rfl: 1   meloxicam (MOBIC) 15 MG tablet, Take 1 tablet  (15 mg total) by mouth daily., Disp: 30 tablet, Rfl: 0   oxyCODONE-acetaminophen (PERCOCET) 5-325 MG tablet, Take 1 tablet by mouth every 4 (four) hours as needed for severe pain., Disp: 30 tablet, Rfl: 0   predniSONE (DELTASONE) 1 MG tablet, Take 2 mg by mouth daily. Take with 5 mg to equal 7 mg daily, Disp: , Rfl:    predniSONE (DELTASONE) 5 MG tablet, Take 5 mg by mouth daily. Take with 2 mg to equal 7 mg daily, Disp: , Rfl:    traMADol (ULTRAM) 50 MG tablet, Take 25-50 mg by mouth 2 (two) times daily as needed for moderate pain., Disp: , Rfl:    triamcinolone cream (KENALOG) 0.1 %, Apply 1 application  topically daily as needed (itching)., Disp: , Rfl:   Social History   Tobacco Use  Smoking Status Former   Current packs/day: 0.00   Types: Cigarettes   Quit date: 01/31/1986   Years since quitting: 36.9  Smokeless Tobacco Never  Tobacco Comments   pt unsure of how long or how much she smoked betofre quitting.     Allergies  Allergen Reactions   Lyrica [Pregabalin] Swelling   Objective:  There were no vitals filed for  this visit. There is no height or weight on file to calculate BMI. Constitutional Well developed. Well nourished.  Vascular Dorsalis pedis pulses palpable bilaterally. Posterior tibial pulses palpable bilaterally. Capillary refill normal to all digits.  No cyanosis or clubbing noted. Pedal hair growth normal.  Neurologic Normal speech. Oriented to person, place, and time. Epicritic sensation to light touch grossly present bilaterally.  Dermatologic Nails well groomed and normal in appearance. No open wounds. No skin lesions.  Orthopedic:  Pain on palpation right dorsal midfoot exostosis positive numbness and tingling noted due to compression of the dorsal nerve.   Radiographs: 3 views of skeletally mature adult right foot:Midfoot arthritis noted moderate to severe in nature.  Plantar and posterior heel spurring noted.  Plantarflexed fifth metatarsal  noted. Assessment:   No diagnosis found.  Plan:  Patient was evaluated and treated and all questions answered.  Right midfoot exostosis recurred once -All questions and concerns were discussed with the patient in extensive detail.  Patient had surgery done by me previously which seems to have regressed and leading to more exostosis.  At this time I discussed with her she will benefit from steroid injection help decrease inflammatory component associate with pain.  Patient agrees with plan to proceed with steroid injection. -A steroid injection was performed at right dorsal midfoot using 1% plain Lidocaine and 10 mg of Kenalog. This was well tolerated. -Shoe gear modification was discussed   No follow-ups on file.

## 2022-12-21 DIAGNOSIS — R159 Full incontinence of feces: Secondary | ICD-10-CM | POA: Diagnosis not present

## 2022-12-27 DIAGNOSIS — H35341 Macular cyst, hole, or pseudohole, right eye: Secondary | ICD-10-CM | POA: Diagnosis not present

## 2023-01-11 DIAGNOSIS — R152 Fecal urgency: Secondary | ICD-10-CM | POA: Diagnosis not present

## 2023-01-11 DIAGNOSIS — R159 Full incontinence of feces: Secondary | ICD-10-CM | POA: Diagnosis not present

## 2023-01-11 DIAGNOSIS — K5909 Other constipation: Secondary | ICD-10-CM | POA: Diagnosis not present

## 2023-01-17 ENCOUNTER — Ambulatory Visit: Payer: Medicare HMO | Admitting: Podiatry

## 2023-01-30 DIAGNOSIS — N183 Chronic kidney disease, stage 3 unspecified: Secondary | ICD-10-CM | POA: Diagnosis not present

## 2023-02-19 ENCOUNTER — Ambulatory Visit (INDEPENDENT_AMBULATORY_CARE_PROVIDER_SITE_OTHER): Payer: Medicare HMO

## 2023-02-19 ENCOUNTER — Ambulatory Visit: Payer: Medicare HMO | Admitting: Podiatry

## 2023-02-19 DIAGNOSIS — M898X9 Other specified disorders of bone, unspecified site: Secondary | ICD-10-CM | POA: Diagnosis not present

## 2023-02-19 DIAGNOSIS — Z4542 Encounter for adjustment and management of neuropacemaker (brain) (peripheral nerve) (spinal cord): Secondary | ICD-10-CM | POA: Diagnosis not present

## 2023-02-19 DIAGNOSIS — Z9889 Other specified postprocedural states: Secondary | ICD-10-CM | POA: Diagnosis not present

## 2023-02-19 NOTE — Progress Notes (Signed)
Subjective:   Patient ID: Andrea Leblanc, female   DOB: 72 y.o.   MRN: [id number]   HPI Patient states still having pain to some degree in my right foot but my left foot is worse and I would like to get that one corrected.  Patient had the right foot corrected in March   ROS      Objective:  Physical Exam  Neurovascular status intact with patient found to have prominence of the midtarsal joint left well-healed incision site right that still has some inflammation but I did not feel any prominence with a history of 1/5 metatarsal elevating procedure right by Dr. Allena Katz.  The left fifth metatarsal is very sore and keratotic lesion formation and rigid elevation of the second toe left     Assessment:  Hammertoe deformity digit to left rigid in nature tailor's bunion deformity with plantarflexed fifth metatarsal and midtarsal joint exostosis left painful with inability to wear shoe gear comfortably with right foot showing improvement but it was difficult for me to say whether or not there was any symptoms     Plan:  8 MP reviewed both conditions and we discussed correction she is very motivated to get the left 1 fax and I have recommended digital fusion we will go ahead and do 1/5 metatarsal head resection and also removal of dorsal spurring and I explained absolutely that there is no guarantee that this will solve all her problems and that ultimately she may need other things done but I feel this gives her the best chance.  She wants to have it done I allowed her to read consent form going over alternative treatments complications and patient scheduled for outpatient surgery with all questions answered today  X-rays indicate there is rigid contracture digit to left there is dorsal spurring and prominence of the metatarsal with history of fracture of the fifth metatarsal right by Dr. Allena Katz which did very well and healing

## 2023-02-20 ENCOUNTER — Telehealth: Payer: Self-pay | Admitting: Urology

## 2023-02-20 NOTE — Telephone Encounter (Signed)
Called pt and LM to call back when she is ready to schedule surgery with Dr. Charlsie Merles.

## 2023-02-23 ENCOUNTER — Encounter: Payer: Self-pay | Admitting: Internal Medicine

## 2023-02-26 ENCOUNTER — Telehealth: Payer: Self-pay | Admitting: Podiatry

## 2023-02-26 NOTE — Telephone Encounter (Signed)
 DOS-03/13/23  MET HEAD RESECTION 5TH [id number] TARSAL EXOSTECTOMY [id number] HAMMER TOE REPAIR 2ND [id number]  [facility] EFFECTIVE DATE- 02/21/23  DEDUCTIBLE- $0.00 WITH REMAINING $0.00 OOP-$3900.00 WITH REMAINING $3900.00 COINSURANCE- 0%  PER THE [employer] WEBSITE PORTAL, PRIOR AUTH IS NOT REQUIRED FOR CPT CODES [id number] AND [phone].  AUTH Decision ID #: [id number]

## 2023-03-07 ENCOUNTER — Telehealth: Payer: Self-pay | Admitting: Urology

## 2023-03-07 NOTE — Telephone Encounter (Signed)
 Pt called stating that she wants to cxl her sx with Dr. Celia Coles on 03/13/23 due to getting over the flu. She doesn't want to reschedule at this time, she stated she will call back to reschedule. I have informed Adah Acron with [facility] and Dr. Celia Coles of this change.

## 2023-03-19 ENCOUNTER — Encounter: Payer: Medicare Other | Admitting: Podiatry

## 2023-03-23 DIAGNOSIS — L259 Unspecified contact dermatitis, unspecified cause: Secondary | ICD-10-CM | POA: Diagnosis not present

## 2023-03-23 DIAGNOSIS — W19XXXA Unspecified fall, initial encounter: Secondary | ICD-10-CM | POA: Diagnosis not present

## 2023-03-23 DIAGNOSIS — K589 Irritable bowel syndrome without diarrhea: Secondary | ICD-10-CM | POA: Diagnosis not present

## 2023-03-23 DIAGNOSIS — Z299 Encounter for prophylactic measures, unspecified: Secondary | ICD-10-CM | POA: Diagnosis not present

## 2023-03-23 DIAGNOSIS — M25551 Pain in right hip: Secondary | ICD-10-CM | POA: Diagnosis not present

## 2023-04-02 ENCOUNTER — Encounter: Payer: Medicare Other | Admitting: Podiatry

## 2023-04-26 DIAGNOSIS — K5909 Other constipation: Secondary | ICD-10-CM | POA: Diagnosis not present

## 2023-04-30 DIAGNOSIS — N189 Chronic kidney disease, unspecified: Secondary | ICD-10-CM | POA: Diagnosis not present

## 2023-04-30 DIAGNOSIS — Z79899 Other long term (current) drug therapy: Secondary | ICD-10-CM | POA: Diagnosis not present

## 2023-04-30 DIAGNOSIS — M328 Other forms of systemic lupus erythematosus: Secondary | ICD-10-CM | POA: Diagnosis not present

## 2023-04-30 DIAGNOSIS — M329 Systemic lupus erythematosus, unspecified: Secondary | ICD-10-CM | POA: Diagnosis not present

## 2023-04-30 DIAGNOSIS — R531 Weakness: Secondary | ICD-10-CM | POA: Diagnosis not present

## 2023-05-04 DIAGNOSIS — N189 Chronic kidney disease, unspecified: Secondary | ICD-10-CM | POA: Diagnosis not present

## 2023-05-09 DIAGNOSIS — M069 Rheumatoid arthritis, unspecified: Secondary | ICD-10-CM | POA: Diagnosis not present

## 2023-05-09 DIAGNOSIS — I129 Hypertensive chronic kidney disease with stage 1 through stage 4 chronic kidney disease, or unspecified chronic kidney disease: Secondary | ICD-10-CM | POA: Diagnosis not present

## 2023-05-09 DIAGNOSIS — N189 Chronic kidney disease, unspecified: Secondary | ICD-10-CM | POA: Diagnosis not present

## 2023-05-09 DIAGNOSIS — N183 Chronic kidney disease, stage 3 unspecified: Secondary | ICD-10-CM | POA: Diagnosis not present

## 2023-05-09 DIAGNOSIS — M329 Systemic lupus erythematosus, unspecified: Secondary | ICD-10-CM | POA: Diagnosis not present

## 2023-05-09 DIAGNOSIS — D631 Anemia in chronic kidney disease: Secondary | ICD-10-CM | POA: Diagnosis not present

## 2023-05-09 DIAGNOSIS — J84112 Idiopathic pulmonary fibrosis: Secondary | ICD-10-CM | POA: Diagnosis not present

## 2023-06-04 DIAGNOSIS — E2749 Other adrenocortical insufficiency: Secondary | ICD-10-CM | POA: Diagnosis not present

## 2023-06-04 DIAGNOSIS — E538 Deficiency of other specified B group vitamins: Secondary | ICD-10-CM | POA: Diagnosis not present

## 2023-06-04 DIAGNOSIS — Z7952 Long term (current) use of systemic steroids: Secondary | ICD-10-CM | POA: Diagnosis not present

## 2023-07-05 DIAGNOSIS — R768 Other specified abnormal immunological findings in serum: Secondary | ICD-10-CM | POA: Diagnosis not present

## 2023-07-05 DIAGNOSIS — D649 Anemia, unspecified: Secondary | ICD-10-CM | POA: Diagnosis not present

## 2023-07-05 DIAGNOSIS — M329 Systemic lupus erythematosus, unspecified: Secondary | ICD-10-CM | POA: Diagnosis not present

## 2023-07-05 DIAGNOSIS — D472 Monoclonal gammopathy: Secondary | ICD-10-CM | POA: Diagnosis not present

## 2023-07-05 DIAGNOSIS — E538 Deficiency of other specified B group vitamins: Secondary | ICD-10-CM | POA: Diagnosis not present

## 2023-07-05 DIAGNOSIS — R700 Elevated erythrocyte sedimentation rate: Secondary | ICD-10-CM | POA: Diagnosis not present

## 2023-07-09 ENCOUNTER — Ambulatory Visit (HOSPITAL_BASED_OUTPATIENT_CLINIC_OR_DEPARTMENT_OTHER): Admitting: Pulmonary Disease

## 2023-07-09 ENCOUNTER — Encounter (HOSPITAL_BASED_OUTPATIENT_CLINIC_OR_DEPARTMENT_OTHER): Payer: Self-pay | Admitting: Pulmonary Disease

## 2023-07-09 VITALS — BP 142/88 | HR 71 | Ht 64.0 in | Wt 138.9 lb

## 2023-07-09 DIAGNOSIS — J849 Interstitial pulmonary disease, unspecified: Secondary | ICD-10-CM | POA: Diagnosis not present

## 2023-07-09 NOTE — Progress Notes (Signed)
 Subjective:    Patient ID: Andrea Leblanc, female    DOB: [birth year], 73 y.o.   MRN: [id number]  HPI  73 yo  former smoker for FU of  CT-ILD  PMhx RA and SLE on chronic prednisone and plaquenil, s/p IVIG 2017  for small fiber neuropathy , HTN  She reports longstanding history of cough for >20 years after working in the [employer]. Cough resolved in 2007 after relocating to [place].   Last Ov with JE 02/2022 >> Discussed care with her Rheumatologist. From a pulmonary standpoint, no indication for immunosuppressants at this time. Will continue serial monitoring of PFTs and CT imaging. Did discuss anti-fibrotics however patient declined   She has been on chronic prednisone and Plaquenil for lupus and rheumatoid arthritis. She experienced a severe cough with significant mucus production for years, which resolved after moving. She has no current swallowing issues, though it was problematic in the past. Her SCL 70 test is negative, and a barium swallow in December 2023 showed no strictures.  Her medication regimen includes a reduced dose of prednisone at 5 mg and Plaquenil. A new medication for lupus was initially denied by insurance but later approved. She also takes an injection, possibly for energy, due to low hemoglobin levels, with a recent level of 11.1.  She recalls a significant health event in 2017 involving hospitalization for pneumonia, a blood transfusion, and paralysis from the waist down, attributed to neuropathy. She aims to regain full function of her legs and feet.  Significant tests/ events reviewed  PFT: 03/10/16 FVC 2.13 (64%) FEV1 1.81 (71%) Ratio 83  TLC 70% DLCO 35%. No significant bronchodilator response Interpretation: Mild restrictive defect with severely reduced gas exchange   07/25/21 [facility] FVC 1.98 (77%) FEV1 1.73 (86%) Ratio 87  TLC 65% DLCO 55% Interpretation: Mild restrictive defect with moderately reduced gas exchange  Imaging: CT Chest 03/10/16 -  Subpleural reticulation and septal thickening increased in bibasilar region with honeycombing and bronchiectasis. Consistent with UIP. Small pericardial effusion   CT Chest HR 04/14/21 [facility] (report only) -  Redemonstrated moderate pulmonary fibrosis in a pattern with  apical to basal gradient, featuring irregular peripheral  interstitial opacity, septal thickening, traction bronchiectasis,  subpleural bronchiolectasis, and extensive areas of honeycombing.  Fibrotic findings may be minimally worsened compared to prior  examination dated 03/13/2019, however are clearly worsened compared  to more remote prior dated 03/10/2016. Findings remain consistent  with UIP  HRCT 03/2022 Minimal progression of pulmonary fibrosis -UIP pattern, patulous fluid-filled esophagus suggesting scleroerma  Barium swallow: no stricture (01/2022)  Review of Systems neg for any significant sore throat, dysphagia, itching, sneezing, nasal congestion or excess/ purulent secretions, fever, chills, sweats, unintended wt loss, pleuritic or exertional cp, hempoptysis, orthopnea pnd or change in chronic leg swelling. Also denies presyncope, palpitations, heartburn, abdominal pain, nausea, vomiting, diarrhea or change in bowel or urinary habits, dysuria,hematuria, rash, arthralgias, visual complaints, headache, numbness weakness or ataxia.     Objective:   Physical Exam  Gen. Pleasant, well-nourished, in no distress ENT - no thrush, no pallor/icterus,no post nasal drip Neck: No JVD, no thyromegaly, no carotid bruits Lungs: no use of accessory muscles, no dullness to percussion, BB dry rales no rhonchi  Cardiovascular: Rhythm regular, heart sounds  normal, no murmurs or gallops, no peripheral edema Musculoskeletal: No deformities, no cyanosis or clubbing        Assessment & Plan:    CT -Interstitial lung disease Interstitial lung disease is well-managed with minimal  progression on recent high-resolution CT chest  compared to 2018. Pulmonary scarring remains stable. - Order pulmonary function tests (PFTs) and CT chest to monitor for changes.  Lupus/Rheumatoid arthritis - well-controlled with chronic prednisone and Plaquenil. Prednisone dose reduced from 7 mg to 5 mg. A new medication /Saphnelo for lupus is now covered by insurance after initial denial.   Small fiber neuropathy Small fiber neuropathy persists with constant numbness in toes. She is on gabapentin for neuropathic pain. Follow-up with neurologist is scheduled.  Esophageal dysmotility Esophageal dysmotility indicated by fluid-filled esophagus on CT scan. No strictures on barium swallow in December 2023. Scleroderma considered but SCL 70 negative and no classic skin changes present.

## 2023-07-09 NOTE — Patient Instructions (Addendum)
  VISIT SUMMARY:  Today, we reviewed your ongoing conditions, including interstitial lung disease, lupus, rheumatoid arthritis, small fiber neuropathy, and esophageal dysmotility. We discussed your current medications and recent test results, and we made plans for further monitoring and follow-up.  YOUR PLAN:  -INTERSTITIAL LUNG DISEASE: Interstitial lung disease involves scarring of the lung tissue, which can affect your breathing. Your condition is stable with minimal progression since 2018. We will order pulmonary function tests (PFTs) and a CT scan of your chest to monitor for any changes.

## 2023-07-25 ENCOUNTER — Telehealth: Payer: Self-pay | Admitting: Podiatry

## 2023-07-25 NOTE — Telephone Encounter (Signed)
 Pt left message stating she was to have surgery a few months ago but cxled and she would like to know how much she would have to pay.   I returned call and explained that I would have to send it to the surgery center and they can give her there quote. I also explained that I would send it to our billing dept for the doctors portion and she will call pt. I did tell her our billing person is out of the office this week so she will not hear back until next week.

## 2023-07-26 DIAGNOSIS — I517 Cardiomegaly: Secondary | ICD-10-CM | POA: Diagnosis not present

## 2023-07-30 DIAGNOSIS — N183 Chronic kidney disease, stage 3 unspecified: Secondary | ICD-10-CM | POA: Diagnosis not present

## 2023-07-31 DIAGNOSIS — M25572 Pain in left ankle and joints of left foot: Secondary | ICD-10-CM | POA: Diagnosis not present

## 2023-07-31 DIAGNOSIS — Z79899 Other long term (current) drug therapy: Secondary | ICD-10-CM | POA: Diagnosis not present

## 2023-07-31 DIAGNOSIS — M329 Systemic lupus erythematosus, unspecified: Secondary | ICD-10-CM | POA: Diagnosis not present

## 2023-08-21 ENCOUNTER — Ambulatory Visit: Admitting: Neurology

## 2023-08-21 DIAGNOSIS — I10 Essential (primary) hypertension: Secondary | ICD-10-CM | POA: Diagnosis not present

## 2023-08-21 DIAGNOSIS — E1165 Type 2 diabetes mellitus with hyperglycemia: Secondary | ICD-10-CM | POA: Diagnosis not present

## 2023-08-21 DIAGNOSIS — M069 Rheumatoid arthritis, unspecified: Secondary | ICD-10-CM | POA: Diagnosis not present

## 2023-08-21 DIAGNOSIS — Z299 Encounter for prophylactic measures, unspecified: Secondary | ICD-10-CM | POA: Diagnosis not present

## 2023-08-21 DIAGNOSIS — E1140 Type 2 diabetes mellitus with diabetic neuropathy, unspecified: Secondary | ICD-10-CM | POA: Diagnosis not present

## 2023-08-21 DIAGNOSIS — L905 Scar conditions and fibrosis of skin: Secondary | ICD-10-CM | POA: Diagnosis not present

## 2023-10-05 DIAGNOSIS — Z79899 Other long term (current) drug therapy: Secondary | ICD-10-CM | POA: Diagnosis not present

## 2023-10-10 DIAGNOSIS — L989 Disorder of the skin and subcutaneous tissue, unspecified: Secondary | ICD-10-CM | POA: Diagnosis not present

## 2023-10-10 DIAGNOSIS — M329 Systemic lupus erythematosus, unspecified: Secondary | ICD-10-CM | POA: Diagnosis not present

## 2023-10-10 DIAGNOSIS — I10 Essential (primary) hypertension: Secondary | ICD-10-CM | POA: Diagnosis not present

## 2023-10-10 DIAGNOSIS — G629 Polyneuropathy, unspecified: Secondary | ICD-10-CM | POA: Diagnosis not present

## 2023-10-10 DIAGNOSIS — R21 Rash and other nonspecific skin eruption: Secondary | ICD-10-CM | POA: Diagnosis not present

## 2023-10-10 DIAGNOSIS — Z1211 Encounter for screening for malignant neoplasm of colon: Secondary | ICD-10-CM | POA: Diagnosis not present

## 2023-10-10 DIAGNOSIS — R531 Weakness: Secondary | ICD-10-CM | POA: Diagnosis not present

## 2023-10-10 DIAGNOSIS — Z1231 Encounter for screening mammogram for malignant neoplasm of breast: Secondary | ICD-10-CM | POA: Diagnosis not present

## 2023-10-24 DIAGNOSIS — L989 Disorder of the skin and subcutaneous tissue, unspecified: Secondary | ICD-10-CM | POA: Diagnosis not present

## 2023-10-24 DIAGNOSIS — Z8639 Personal history of other endocrine, nutritional and metabolic disease: Secondary | ICD-10-CM | POA: Diagnosis not present

## 2023-10-24 DIAGNOSIS — I10 Essential (primary) hypertension: Secondary | ICD-10-CM | POA: Diagnosis not present

## 2023-10-24 DIAGNOSIS — M329 Systemic lupus erythematosus, unspecified: Secondary | ICD-10-CM | POA: Diagnosis not present

## 2023-10-24 DIAGNOSIS — M533 Sacrococcygeal disorders, not elsewhere classified: Secondary | ICD-10-CM | POA: Diagnosis not present

## 2023-10-25 DIAGNOSIS — C44622 Squamous cell carcinoma of skin of right upper limb, including shoulder: Secondary | ICD-10-CM | POA: Diagnosis not present

## 2023-11-06 DIAGNOSIS — D0461 Carcinoma in situ of skin of right upper limb, including shoulder: Secondary | ICD-10-CM | POA: Diagnosis not present

## 2023-11-06 DIAGNOSIS — L988 Other specified disorders of the skin and subcutaneous tissue: Secondary | ICD-10-CM | POA: Diagnosis not present

## 2023-11-07 DIAGNOSIS — R2689 Other abnormalities of gait and mobility: Secondary | ICD-10-CM | POA: Diagnosis not present

## 2023-11-07 DIAGNOSIS — G629 Polyneuropathy, unspecified: Secondary | ICD-10-CM | POA: Diagnosis not present

## 2023-11-07 DIAGNOSIS — R29898 Other symptoms and signs involving the musculoskeletal system: Secondary | ICD-10-CM | POA: Diagnosis not present

## 2023-11-15 DIAGNOSIS — N183 Chronic kidney disease, stage 3 unspecified: Secondary | ICD-10-CM | POA: Diagnosis not present

## 2023-11-27 ENCOUNTER — Ambulatory Visit (HOSPITAL_COMMUNITY)

## 2023-11-27 ENCOUNTER — Other Ambulatory Visit (HOSPITAL_COMMUNITY)

## 2024-02-04 ENCOUNTER — Encounter (HOSPITAL_BASED_OUTPATIENT_CLINIC_OR_DEPARTMENT_OTHER)

## 2024-02-04 ENCOUNTER — Ambulatory Visit (HOSPITAL_BASED_OUTPATIENT_CLINIC_OR_DEPARTMENT_OTHER): Admitting: Pulmonary Disease

## 2024-02-12 NOTE — H&P (Signed)
 [facility]   History and Physical   Assessment / Plan  - Neurological:  1) Subacute left SDH with focal brain compression, without LOC. SDH unrelated to circulating anticoagulants.    - Admit to: Neuroscience Intensive Care Unit - Neurosurgical consult to: Dr Juanice - Obtain Neuroimaging: Head CT @ 4am - Control Systolic blood pressure to goal: <160 - Reverse anticoagulation with: N/A - DVT prophylaxis: Sequential Compression Devices - Vital Signs per unit routine - Code Status Full Code - No AEDs at this time  2) Acute Headache - PRN Tylenol    3) Anxiolysis/Agitation  - Will monitor - Continue Cymbalta after home med reconciliation completed   4) Impaired Mobility - PT/OT when appropriate  5) Social / Ethics - Case management consult  - Cardiovascular:  1) Hypertension, hx - Goal SBP < 160 - Cardene gtt if indicated for goal - Home medication reconciliation pending - PRN hydralazine and labetalol  for goal - EKG ordered  - Echo ordered   - Respiratory:  1) Asthma, hx - No active issues  - 97% on RA - CXR: no acute abnormality  - Gastroenteric:  1) GERD, hx - Nurse to perform bedside dysphagia screening  - NPO x meds - Oral Intake - PPI: N/A - CCM Bowel Protocol - Last BM Date: -- (PTA)   - Endocrine:  1) Diabetes mellitis, hx - A1c ordered  - BG on unit admission  - Glucose surveillance   - Renal/Electrolytes:  1) Chronic kidney disease, hx - CMP ordered  - BUN 19, Cr 0.90, GFR 68 - I&O  - IVF: NS @ 50 - BMP, Mag, Phos daily   - Infectious:  - No acute abnormality - Abx: Rocephin 1 g adm at OSH for dx pneumonia - WBC 6.7 - Afebrile, Tmax 97.6 - Culture with temp > = 101.5 - CBC daily   - Hematologic:  - No acute issues - Hgb 11.7, Plts 258 - CBC daily   - Prophylaxis: Sequential Compression Device  - Mobility: CCM Mobility Protocol  - Lines: PIVs  - This patient is critically ill due to SDH with numerous  comorbidities and at significant risk of neurological worsening, respiratory failure, bleeding, infection, and Cerebral edema, Hematoma expansion, Seizures, Cerebral vasospasm, and Hemodynamic instability. This patient's care requires constant monitoring of vital signs, hemodynamics, respiratory and cardiac monitoring, review of multiple databases, neurological assessment, discussion with family, other specialists and medical decision making of high complexity.  History  History of Present Illness Andrea Leblanc is a 73 y.o. female who  has no past medical history on file. Past history from OSH includes lupus (SLE),  chronic kidney disease, hypertension, diabetes mellitus, GERD, neuropathy, asthma, IBS. She reportedly lives alone with family checking on her periodically. She presented to [facility] after family found her to be altered. Her last known well was 12/16. She was found on 12/23 with decreased verbal, unknown previous oral intake.  A CT head was obtained, showing a left SDH with 6 mm midline shift. She was transferred to [facility] for increased level of care. No family present.   The patient is unable to provide an HPI or a full system review due to altered level of consciousness.   HPI: Chief Complaint: SDH  No past medical history on file. No past surgical history on file.  Allergies[1]. Prior to Admission medications  Not on File   Social History[2] Family History[3] Review of Systems - Unable to assess due to AMS  Physical Examination  Temp:  [97.6 F (36.4 C)-97.7 F (36.5 C)] 97.6 F (36.4 C) Heart Rate:  [73-90] 73 Resp:  [11-26] 13 BP: (125-146)/(60-79) 145/69 SpO2:  [75 %-100 %] 95 % CPOT Score: 0 O2 Device: None (Room air) O2 Flow Rate (L/min): 2 L/min No data recorded  No chief complaint on file.   BP 145/69   Pulse 73   Temp 97.6 F (36.4 C) (Oral)   Resp 13   Ht 1.651 m (5' 5)   Wt 56 kg (123 lb 7.3 oz)   SpO2 95%   BMI 20.54 kg/m   GCS: Eye  Opening: To sound Verbal Response: Confused Best Motor Response: Obey commands Glasgow Coma Scale Score: 13 Glasgow Coma Scale Breakdown: E=3, V=4, M=6  General:  Critically ill, Well Developed, Well Nourished, and No acute distress Head, Eyes, Ears, Nose, Throat: Normocephalic, atraumatic, Conjunctiva normal, Oropharynx moist and clear Neck:  Supple with normal range of motion, No lymphadenopathy  Cardiovascular:  Regular, rate, and rhythm Lungs:  Clear to auscultation bilaterally Abdomen:  Bowel sounds Present, Hypoactive, Soft, and Non-tender Skin:  No Focal Rashes  Extremities:  No clubbing, cyanosis, or edema.  Neurologic: Mental Status: Awake, Confused, Lethargic but Arousable, and Follows commands Speech: Dysarthria Orientation to: Person and Time CN II-XII: normal Visual: Full to threat Pupils: Equal, Right Pupil 3 mm, Left Pupil 3 mm, and Reactive EOM: Unable to assess Corneal Reflex: Not tested in this awake patient Face: Symmetric Cough/gag Reflex: Not tested in this awake patient Motor: Antigravity throughout, strength grossly intact. Follows commands throughout Sensation: Left normal and Right normal Reflexes: Not Tested Toes: Not tested Coordination: Unable to assess Gait: Unable to assess  Results  Labs:  Recent Results (from the past 24 hours)  ECG 12 lead   Collection Time: 02/12/24  8:47 PM  Result Value Ref Range   Acquisition Device D3K    Systolic BP 144 mmHg   Diastolic BP 74 mmHg   Ventricular Rate 83 BPM   Atrial Rate 83 BPM   P-R Interval 186 ms   QRS Duration 138 ms   Q-T Interval 402 ms   QTC Calculation(Bazett) 472 ms   Calculated P Axis 52 degrees   Calculated R Axis -49 degrees   Calculated T Axis 21 degrees   ECG Diagnosis      Normal sinus rhythm Right bundle branch block Left anterior fascicular block ** * Bifascicular block ** * Moderate voltage criteria for LVH, may be normal variant Cannot rule out Inferior infarct (masked by  fascicular block?) , age undetermined Abnormal ECG No previous ECGs available Tannenbaum, Jordan ([id number]) on 02/12/2024 9:38:44 PM certifies that he/she has reviewed the ECG tracing and confirms the independent interpretation is correct.   POCT Glucose   Collection Time: 02/12/24  8:53 PM  Result Value Ref Range   Glucose, POC 118 (H) 70 - 99 mg/dL   OPERATOR ID [id number]    INSTRUMENT ID [id number]   Comprehensive metabolic panel   Collection Time: 02/12/24 10:16 PM  Result Value Ref Range   Na 140 136 - 146 mmol/L   Potassium 3.9 3.7 - 5.4 mmol/L   Cl 109 (H) 97 - 108 mmol/L   CO2 17 (L) 20 - 32 mmol/L   AGAP 14 7 - 16 mmol/L   Glucose 107 (H) 65 - 99 mg/dL   BUN 19 8 - 27 mg/dL   Creatinine 9.09 9.42 - 1.00 mg/dL   Ca 9.7 8.6 - 10.2  mg/dL   ALK PHOS 66 25 - 834 U/L   T Bili 0.5 0.0 - 1.2 mg/dL   Total Protein 8.1 6.0 - 8.5 gm/dL   Alb 3.2 (L) 3.5 - 4.8 gm/dL   GLOBULIN 4.9 (H) 1.5 - 4.5 gm/dL   ALBUMIN/GLOBULIN RATIO 0.7 (L) 1.1 - 2.5   BUN/CREAT RATIO 21.1 11.0 - 26.0   ALT 26 0 - 40 U/L   AST 44 (H) 0 - 40 U/L   eGFR 68 >=60 mL/min/1.37m2  Magnesium   Collection Time: 02/12/24 10:16 PM  Result Value Ref Range   Mg 1.9 1.6 - 2.6 mg/dL  Phosphorus   Collection Time: 02/12/24 10:16 PM  Result Value Ref Range   Phos 2.5 2.5 - 4.5 mg/dL  Previous Blood Bank History?   Collection Time: 02/12/24 10:16 PM  Result Value Ref Range   Prev Pt History? NO   ABORh   Collection Time: 02/12/24 10:16 PM  Result Value Ref Range   ABO Rh Type B POS   Antibody Screen   Collection Time: 02/12/24 10:16 PM  Result Value Ref Range   Antibody Screen NEG   CBC   Collection Time: 02/12/24 10:17 PM  Result Value Ref Range   WBC 6.7 4.0 - 10.5 thou/mcL   RBC 3.70 (L) 3.93 - 5.22 million/mcL   HGB 11.7 11.2 - 15.7 gm/dL   HCT 61.6 65.8 - 55.0 %   MCV 103.5 (H) 79.4 - 94.8 fL   MCH 31.6 25.6 - 32.2 pg   MCHC 30.5 (L) 32.2 - 35.5 gm/dL   Plt Ct 741 849 - 599 thou/mcL   RDW SD 48.8  (H) 35.1 - 46.3 fL   MPV 10.2 9.4 - 12.4 fL   NRBC% 0.0 0.0 - 0.2 /100WBC   Absolute NRBC Count 0.00 0.00 - 0.01 thou/mcL  Protime-INR   Collection Time: 02/12/24 10:17 PM  Result Value Ref Range   PT 15.9 (H) 11.8 - 14.3 second(s)   INR 1.3 See Therapeutic ranges  PTT   Collection Time: 02/12/24 10:17 PM  Result Value Ref Range   PTT 27 22 - 35 second(s)  Gen5 Cardiac Troponin T (TnT5) Baseline Series at: baseline and 3 hour   Collection Time: 02/12/24 10:17 PM  Result Value Ref Range   TnT-Gen5 (0hr) 26 (H) <14 ng/L  ECG 12 lead   Collection Time: 02/13/24 12:45 AM  Result Value Ref Range   Acquisition Device D3K    Systolic BP 134 mmHg   Diastolic BP 64 mmHg   Ventricular Rate 80 BPM   Atrial Rate 80 BPM   P-R Interval 184 ms   QRS Duration 128 ms   Q-T Interval 398 ms   QTC Calculation(Bazett) 459 ms   Calculated P Axis 38 degrees   Calculated R Axis -49 degrees   Calculated T Axis -5 degrees   ECG Diagnosis      Sinus rhythm with frequent premature ventricular complexes Left axis deviation Right bundle branch block Voltage criteria for left ventricular hypertrophy Inferior infarct (cited on or before 12-Feb-2024) Anterior infarct , age undetermined Abnormal ECG When compared with ECG of 12-Feb-2024 20:47, premature ventricular complexes are now present Anterior infarct is now present Questionable change in initial forces of Inferior leads   CBC   Collection Time: 02/13/24  1:56 AM  Result Value Ref Range   WBC 7.3 4.0 - 10.5 thou/mcL   RBC 3.67 (L) 3.93 - 5.22 million/mcL   HGB 11.7 11.2 -  15.7 gm/dL   HCT 62.8 65.8 - 55.0 %   MCV 101.1 (H) 79.4 - 94.8 fL   MCH 31.9 25.6 - 32.2 pg   MCHC 31.5 (L) 32.2 - 35.5 gm/dL   Plt Ct 710 849 - 599 thou/mcL   RDW SD 47.8 (H) 35.1 - 46.3 fL   MPV 10.6 9.4 - 12.4 fL   NRBC% 0.0 0.0 - 0.2 /100WBC   Absolute NRBC Count 0.00 0.00 - 0.01 thou/mcL  Basic metabolic panel   Collection Time: 02/13/24  1:56 AM  Result  Value Ref Range   Na 141 136 - 146 mmol/L   Potassium 4.3 3.7 - 5.4 mmol/L   Cl 107 97 - 108 mmol/L   CO2 20 20 - 32 mmol/L   AGAP 14 7 - 16 mmol/L   Glucose 93 65 - 99 mg/dL   BUN 20 8 - 27 mg/dL   Creatinine 9.05 9.42 - 1.00 mg/dL   Ca 9.7 8.6 - 89.7 mg/dL   BUN/CREAT RATIO 78.6 11.0 - 26.0   eGFR 65 >=60 mL/min/1.26m2  Magnesium   Collection Time: 02/13/24  1:56 AM  Result Value Ref Range   Mg 1.8 1.6 - 2.6 mg/dL  Phosphorus   Collection Time: 02/13/24  1:56 AM  Result Value Ref Range   Phos 2.6 2.5 - 4.5 mg/dL  TSH   Collection Time: 02/13/24  1:56 AM  Result Value Ref Range   TSH 1.07 0.45 - 4.50 mcIU/mL  ABO and RH Confirmation   Collection Time: 02/13/24  1:56 AM  Result Value Ref Range   ABO Rh Type B POS   Gen5 Cardiac Troponin T (TnT5) 3H   Collection Time: 02/13/24  1:56 AM  Result Value Ref Range   TnT-Gen5 (3hr) 29 (H) <14 ng/L   Delta 3 Hour 3 <7 ng/L   Imaging: CT Head WO Contrast Result Date: 02/13/2024 INDICATION:    Intracranial Hemorrhage, COMPARISON:   02/12/2024 TECHNIQUE:    Multiple axial images obtained from the skull base to the vertex without IV contrast  were obtained on  02/13/2024 2:10 AM FINDINGS: There is a complexed mixed attenuation hematoma over the left greater convexity the appearance of which is most suggestive of a epidural hematoma. There is mass effect upon the left cerebral hemisphere and left ventricle with a left-to-right midline shift estimated at 5.7 mm. Paranasal sinuses: Clear. Mastoid air cells: Clear. Calvarium: Intact.   IMPRESSION: Mixed attenuation convexed hematoma over the left greater convexity consistent with an epidural hematoma with mass effect upon the left cerebral hemisphere ventricle and a 5.7 mm left right midline shift. Paranasal similar to prior outside CT scan of 02/12/2024. Electronically Signed by: Marinda Fleming, MD on 02/13/2024 2:45 AM  XR Chest Ap Portable Result Date: 02/13/2024 Single view of the  chest. HISTORY: Weakness. The patient has taken a poor inspiration. There is no acute infiltrate. There is no pleural effusion. The cardiac silhouette cannot be properly evaluated due to points patient and AP technique. The visualized osseous structures demonstrate no gross abnormality.   IMPRESSION: No acute pulmonary disease. Electronically Signed by: Marinda Fleming, MD on 02/13/2024 1:08 AM  CT Outside Images Head Result Date: 02/12/2024 Patient Name:  Andrea Leblanc Date of Birth:  09/16/1950  female Interpretation Procedure was imported into PACS for comparison purposes only.   XR Outside Images Chest Result Date: 02/12/2024 Patient Name:  Andrea Leblanc Date of Birth:  11/25/1950  female Interpretation Procedure was imported into PACS for comparison  purposes only.   ECG: No results found.  All labs and images reviewed by Dr. Tamar  Plan d/w and agreed by Dr. Tamar  Total Critical Care time spent: 40 minutes.  This time includes review of data, examination of the patient, formulation of plan.  This time is separate from any billable procedures.  Electronically signed: Asberry Samuels, FNP 02/13/2024 / 4:20 AM        [1] Allergies Allergen Reactions   Pregabalin [Lyrica] Itching and Swelling  [2]   [3] No family history on file.

## 2024-02-28 NOTE — Nursing Note (Signed)
 Report called to Powell, LPN at [facility].  Brittany Plyler, LPN

## 2024-02-28 NOTE — Nursing Note (Signed)
 No IV present at this time. Pt personal belongings packed into bag at bedside. Pt stated she has all personal belongings. Primary nurse called report to facility.  Dorn FORBES Sicks 02/28/2024 / 2:50 PM

## 2024-02-28 NOTE — Progress Notes (Signed)
 [facility] Case Management Discharge Note   Patient:   Andrea Leblanc MR Number:  [id number] Patient Date of Birth: 02-May-1950 Age/Sex:  74 y.o./female   Discharge Plan   Case Management interviewed: Patient, Decision maker Phone Number: [phone] Disposition: Skilled nursing facility    Skilled nursing facility Is this a new placement to SNF?: Yes CM discussed geographic area, insurance, NH finanical affiliation, NH PACN, patient choice and facility will discuss cost involved, if applicable.: Yes Skilled Nursing Facility (SNF) list was: made available to patient/caregiver to review at their convenience CM discussed with interviewee options for home support should patient progress or have barriers that prevent placement.: Yes SNF referral agreed upon by interviewee will be sent to: [facility], all facilities within 30 miles of patient's zipcode and expansion if no offers available SNF/FL2 Referral Coordination Status: CM requested the [facility] to send referral to above preference(s) Insurance Authorization: is needed prior to discharge Date SNF Auth Submitted: 02/22/24 SNF Auth Status: Approved SNF Auth Expiration Date: 03/05/24 SNF Auth Number: [employer] Auth #[id number] Is placement for short term rehab or long term care: Short term care SNF Liaison Name:: [facility] SNF Liaison Phone Number:: [phone] SNF referral accepted, selected and confirmation received through NH Link: Yes Nurse to call report to facility at: [phone]    Coordinated Support Services: No Needs    Transportation   Does the patient need discharge transport arranged?: Yes Discharge Transportation: Wheelchair Has discharge transport been arranged?: Yes Agency Name: [facility] Agency Phone Number: [phone] Date of transport: 02/28/24 What time is transport scheduled?: 1500  Brother Elsie has agreed to pay for [facility] transport rather  than transporting the patient himself.  [facility] provided with brother's contact information for payment arrangement.   Accepted Agency   Selected Continued Care - Admitted Since 02/12/2024     Destination Coordination complete.    Service Provider Services Address Phone   [facility]  Skilled Nursing [address] [place] [address] [phone]           Patient is medically ready for discharge to [facility] today.  IM reviewed with patient and signature obtained.  Patient, brother Elsie, MD, RN, ADT, MUR, and Christy/[facility] are all aware of discharge plan and transportation arrangements.  Level of care algorithm used for discharge planning.  Electronically signed: Parris Bame, RN, BSN 02/28/2024 10:25 AM

## 2024-02-28 NOTE — Discharge Summary (Signed)
 [facility]  [facility] Discharge Summary  PCP: Lynwood Con Gasman, MD Discharge Details   Admit date:         02/12/2024 Discharge date:        02/28/2024  Hospital Days:    16 days  Code Status:   Full Code Advanced Directives on file: No Directive        Discharge Diagnoses:  Principal Problem:   Subdural hematoma (*) Active Problems:   Other forms of systemic lupus erythematosus (*)   Neuropathy   Sciatic nerve pain, left   Primary hypertension   Uncomplicated asthma (*)   Gastroesophageal reflux disease without esophagitis   IBS (irritable bowel syndrome)   Severe malnutrition (*)   Task list for follow-up:    Follow-Up Appointments Suggested: [facility] [address]   [address] [phone]    Ambulatory referral to Neurology     Lynwood Con Gasman, MD [address] [place] [address] [phone]     Follow up with Primary Care Physician:   Hospital DC f/u  Follow-Up Appointments Already Scheduled: Future Appointments  Date Time Provider Department Center  03/03/2024 10:00 AM Mallory Kurt [place], ACNP [facility] None  03/25/2024  1:40 PM Almarie Tery Real, MD [facility] None    Discharge Medications:   Current Discharge Medication List     PAUSE taking these medications as directed      Details  meloxicam  15 mg tablet Wait to take this until your doctor or other care provider tells you to start again. Commonly known as: MOBIC   Take one tablet (15 mg dose) by mouth daily. Pause reason: Post-procedure       START taking these medications      Details  cholecalciferol 1,000 units (25 mcg) tablet Commonly known as: VITAMIN D-3 Start taking on: February 29, 2024  Take one tablet (1,000 Units dose) by mouth daily. Quantity: 60 tablet   * gabapentin 300 mg capsule Commonly known as: NEURONTIN  Take one capsule (300 mg  dose) by mouth at bedtime. Quantity: 90 capsule   * gabapentin 100 mg capsule Commonly known as: NEURONTIN Start taking on: February 29, 2024 Replaces: gabapentin 800 mg tablet  Take one capsule (100 mg dose) by mouth daily. Quantity: 90 capsule      * * This list has 2 medication(s) that are the same as other medications prescribed for you. Read the directions carefully, and ask your doctor or other care provider to review them with you.          CONTINUE these medications which have NOT CHANGED      Details  amLODIPine besylate 5 mg tablet Commonly known as: NORVASC  Take one tablet (5 mg dose) by mouth daily.   DULoxetine HCl 60 mg capsule Commonly known as: CYMBALTA  Take one capsule (60 mg dose) by mouth daily.   furosemide 40 mg tablet Commonly known as: LASIX  Take one tablet (40 mg dose) by mouth daily as needed.   hydroxychloroquine 200 MG tablet Commonly known as: PLAQUENIL  Take one and a half tablets (300 mg dose) by mouth daily.   mycophenolate mofetil 500 mg tablet Commonly known as: CELLCEPT  Take one tablet (500 mg dose) by mouth 2 (two) times daily.   traMADol  50 mg tablet Commonly known as: ULTRAM   Take one tablet (50 mg dose) by mouth daily for 7 days. Max Daily  Amount: 50 mg Quantity: 7 tablet   triamcinolone  acetonide 0.1% cream Commonly known as: KENalog  Apply one g (1 Application dose) topically 3 (three) times a day.      * You might also be taking other medications not listed above. If you have questions about any of your other medications, talk to the person who prescribed them or your Primary Care Provider.          STOP taking these medications    gabapentin 800 mg tablet Commonly known as: NEURONTIN Replaced by: gabapentin 100 mg capsule        Allergies: Allergies[1]  Consultations this Admission: IP CONSULT TO CASE MANAGEMENT, RN/SW IP CONSULT TO CASE MANAGEMENT, RN/SW IP CONSULT TO NEUROSURGERY IP CONSULT TO IV  TEAM IP CONSULT TO NUTRITION SERVICES IP CONSULT TO IV TEAM IP CONSULT TO HOSPITALIST  Procedures/Imaging: Procedures (LRB): left burr holes for evacuation of hematoma (Left)    XR Chest Ap Portable  Final Result  IMPRESSION:  1. Mild atelectasis or infiltrate left base    Electronically Signed by: Glendia Guillaume, MD on 02/14/2024 9:04 AM    CT Head WO Contrast  Final Result  IMPRESSION:  1.  Interval placement of a left subdural drain with decreased size of the left subdural hemorrhage. Decreased left to right midline shift now measures 4 mm (previously 6 mm).      Electronically Signed by: Valma Companion, MD on 02/14/2024 9:15 AM    Echocardiogram Complete W Enhancing Agent  Final Result  Left Ventricle: Systolic function is normal. EF: 60-65%.    Left Atrium: Injection of agitated saline documents no interatrial   shunt.      CT Outside Images Head  Final Result    XR Outside Images Chest  Final Result  Andrea Leblanc  Date of Birth:  08-Dec-1950  female      Interpretation    Procedure was imported into PACS for comparison purposes only.          XR Hip 2-3 Views Left  Final Result  IMPRESSION:   No acute osseous abnormalities.            Electronically Signed by: Marinda Fleming, MD on 02/13/2024 5:39 AM    CT Head WO Contrast  Final Result  IMPRESSION:      Mixed attenuation convexed hematoma over the left greater convexity consistent with an epidural hematoma with mass effect upon the left cerebral hemisphere ventricle and a 5.7 mm left right midline shift.    Paranasal similar to prior outside CT scan of 02/12/2024.        Electronically Signed by: Marinda Fleming, MD on 02/13/2024 2:45 AM    XR Chest Ap Portable  Final Result  IMPRESSION:    No acute pulmonary disease.    Electronically Signed by: Marinda Fleming, MD on 02/13/2024 1:08 AM    XR Outside Images Chest  Final Result    CT Outside Images Head  Final Result       Pertinent Labs:  Cardiac Labs: No results for input(s): CK, CKMB, CTNI, BNP in the last 168 hours. CBC: Recent Labs    Units 02/24/24 0155 02/22/24 0100  WBC thou/mcL 4.0 4.4  HGB gm/dL 9.9* 9.3*  PLT thou/mcL 272 285   BMP: Recent Labs    Units 02/24/24 0155 02/22/24 0058  NA mmol/L 134* 134*  K mmol/L 4.4 4.7  CL mmol/L 102 102  CO2 mmol/L 24 21  BUN mg/dL 9 11  CREATININE  mg/dL 9.14 9.09   Lipid Panel: No results for input(s): CHOL, TRIG, HDL, LDL in the last 168 hours. Liver Enzymes: No results for input(s): INR, AST, ALT, ALKPHOS, BILITOT in the last 168 hours. Endocrine Panels: Recent Labs    Units 02/24/24 0155 02/22/24 0058  GLUCOSE mg/dL 91 80    Hospital Course   Physicians involved in care during this hospitalization Attending Provider: Burman Paullette GAILS, MD Attending Provider: Glendia Rose, MD Attending Provider: Almarie Tery Real, MD Admitting Provider: Burman Paullette GAILS, MD Consulting Physician: Almarie Tery Real, MD Anesthesiologist: Belvie JINNY Civatte, MD Consulting Physician: [employer] [facility] [employer] Consulting Physician: Keitha Deed, DO Consulting Physician: Latisha Aho, MD Consulting Physician: Norleen JENEANE Hom[place], MD Consulting Physician: Herlene JINNY Nail, DO Consulting Physician: Duayne Feller, MD   Admit date: 02/12/2024 Discharge date : 02/28/2024  Admission Diagnosis: Subdural hematoma (*) Discharge diagnosis: Subdural hematoma (*)  Admission condition: good Discharge condition: good  Complications: none  After discussion with Almarie Real, MD regarding the risks, benefits, indications, and alternatives, the patient decided to proceed with the above named procedure.  Hospital Course: The patient was admitted for surgery. Postoperatively, the patient was recovered in the PACU and transferred to the Acute Care floor. Over the course of their  hospitalization neurosurgical milestones were met. Transitioned to oral pain medications. Drain out put decreased and was removed. Incision is healing well without  induration, drainage, or erythema. The patient is tolerating a regular diet and is ambulating safely. The patient is voiding urine and passing gas. The patient is afebrile with no evidence of DVT or other intercurrent morbidity. Denies chest pain, shortness of breath, nausea, vomiting, or abdominal pain. The patient was evaluated by Physical and Occupational therapy and deemed appropriate for SNF. Motor exam continues to improve. As such, the patient is ready for discharge. Discussion of home care with patient included home prescriptions, wound care, postoperative restrictions, mobility, and follow up. Encouraged patient to call our office as needed before then.   On day of discharge, patient reports she is doing well and ready to get out of the hospital..   Discharge exam   Blood pressure 131/71, pulse 71, temperature 98 F (36.7 C), temperature source Oral, resp. rate 17, height 1.651 m (5' 5), weight 56 kg (123 lb 7.3 oz), SpO2 99%.   General Appearance: well nourished, no distress  HEENT: Normocephalic, atraumatic. Neck supple, trachea midline, hearing intact Lung: no SOB, BBS clear Heart: RRR Abdomen: Abdomen soft, non-tender. BS normal.  Skin: warm and dry.    Neuro Exam:Patient is awake and alert and oriented x 3.  CN II-XII grossly intact. Face symmetrical. Tongue midline. Pupils equal and reactive. Extra ocular movements intact. Speech clear Sensory Exam: Sensation grossly intact to light touch Proprioception intact Motor Exam: Right upper extremity 4/5 strength through bicep, tricep, deltoid, grip Left upper extremity 4/5 strength through bicep, tricep, deltoid, grip, Right lower extremity 4/5 strength through ilipsoas, quads,  dorsi/plantar flexion Left lower extremity 4/5 strength through ilipsoas,  quads,dorsi/plantar flexion Gait: did not assess     BP 131/71 (BP Location: Right Upper Arm, Patient Position: Lying)   Pulse 71   Temp 98 F (36.7 C) (Oral)   Resp 17   Ht 1.651 m (5' 5)   Wt 56 kg (123 lb 7.3 oz)   SpO2 99%   BMI 20.54 kg/m     Post Hospital Care   Activity:   Weight Bearing Status:  Oxygen  Orders for Discharge: O2 Device: None (Room air) SpO2: 99 %  Diet: Diet and Nourishment Orders (From admission, onward)     Start       02/20/24 1700  Dietary nutrition supplements Other  With Lunch & Dinner       Comments: Fortified Pudding  Question:  Select Supplement  Answer:  Other     02/20/24 1346  Regular Diet  Diet effective now                   Wound Care Recommendations:    Lines/Drains/Airways: Patient Lines/Drains/Airways Status     Active LDAs     None            Therapy Recommendations:  PT: Anticipated PT needs at discharge: Moderate intensity therapy - less than 3 hours of therapy per day Anticipated Caregiver Needs at Discharge: Physical hands-on assistance DME Equipment Recommendations: Bedside commode, Other (comment) (defer to the next LOC)     AM-PAC Basic Mobility Raw Score (out of 24): 17 Routine Mobility Goal: Standing 1 OR More Minutes Wash Face, Comb Hair, Shave, Brush Teeth 5  OT: Anticipated OT needs at discharge: Moderate intensity therapy - less than 3 hours of therapy per day Anticipated caregiver needs at discharge: Physical hands-on assistance   DME Equipment Recommendations: Drop-arm bedside commode, Rolling walker, Wheelchair - manual, Other (comment)  SLP:              Home Health Orders: DME Orders (From admission, onward)    None      Home Health Agency     None       I spent 35 minutes performing discharge services.   Electronically signed: Lamarr LITTIE Ferrier, NP 02/28/2024 / 9:41 AM       [1] Allergies Allergen Reactions   Pregabalin [Lyrica]  Itching and Swelling  *Some images could not be shown.

## 2024-03-04 ENCOUNTER — Emergency Department (HOSPITAL_COMMUNITY): Payer: Medicare (Managed Care)

## 2024-03-04 ENCOUNTER — Encounter (HOSPITAL_COMMUNITY): Payer: Self-pay | Admitting: Emergency Medicine

## 2024-03-04 ENCOUNTER — Emergency Department (HOSPITAL_COMMUNITY)
Admission: EM | Admit: 2024-03-04 | Discharge: 2024-03-04 | Disposition: A | Discharge status: Other Acute Inpatient Hospital | Payer: Medicare (Managed Care) | Attending: Emergency Medicine | Admitting: Emergency Medicine

## 2024-03-04 ENCOUNTER — Other Ambulatory Visit: Payer: Self-pay

## 2024-03-04 DIAGNOSIS — E1122 Type 2 diabetes mellitus with diabetic chronic kidney disease: Secondary | ICD-10-CM | POA: Diagnosis not present

## 2024-03-04 DIAGNOSIS — R4182 Altered mental status, unspecified: Secondary | ICD-10-CM | POA: Diagnosis present

## 2024-03-04 DIAGNOSIS — R0600 Dyspnea, unspecified: Secondary | ICD-10-CM | POA: Insufficient documentation

## 2024-03-04 DIAGNOSIS — I6200 Nontraumatic subdural hemorrhage, unspecified: Secondary | ICD-10-CM | POA: Insufficient documentation

## 2024-03-04 DIAGNOSIS — G9389 Other specified disorders of brain: Secondary | ICD-10-CM | POA: Insufficient documentation

## 2024-03-04 DIAGNOSIS — S065XAA Traumatic subdural hemorrhage with loss of consciousness status unknown, initial encounter: Secondary | ICD-10-CM

## 2024-03-04 DIAGNOSIS — N189 Chronic kidney disease, unspecified: Secondary | ICD-10-CM | POA: Insufficient documentation

## 2024-03-04 DIAGNOSIS — Z79899 Other long term (current) drug therapy: Secondary | ICD-10-CM | POA: Diagnosis not present

## 2024-03-04 LAB — TROPONIN T, HIGH SENSITIVITY
Troponin T High Sensitivity: 26 ng/L — ABNORMAL HIGH (ref 0–19)
Troponin T High Sensitivity: 24 ng/L — ABNORMAL HIGH (ref 0–19)

## 2024-03-04 LAB — BLOOD GAS, VENOUS
Acid-base deficit: 3.6 mmol/L — ABNORMAL HIGH (ref 0.0–2.0)
Bicarbonate: 23.1 mmol/L (ref 20.0–28.0)
Drawn by: 65579
O2 Saturation: 16.4 %
Patient temperature: 36.7
pCO2, Ven: 46 mmHg (ref 44–60)
pH, Ven: 7.3 (ref 7.25–7.43)
pO2, Ven: <31 mmHg — CL (ref 32–45)

## 2024-03-04 LAB — CBC WITH DIFFERENTIAL/PLATELET
Abs Immature Granulocytes: 0.02 K/uL (ref 0.00–0.07)
Basophils Absolute: 0 K/uL (ref 0.0–0.1)
Basophils Relative: 0 %
Eosinophils Absolute: 0.1 K/uL (ref 0.0–0.5)
Eosinophils Relative: 2 %
HCT: 35.5 % — ABNORMAL LOW (ref 36.0–46.0)
Hemoglobin: 11.2 g/dL — ABNORMAL LOW (ref 12.0–15.0)
Immature Granulocytes: 0 %
Lymphocytes Relative: 6 %
Lymphs Abs: 0.5 K/uL — ABNORMAL LOW (ref 0.7–4.0)
MCH: 32.2 pg (ref 26.0–34.0)
MCHC: 31.5 g/dL (ref 30.0–36.0)
MCV: 102 fL — ABNORMAL HIGH (ref 80.0–100.0)
Monocytes Absolute: 0.6 K/uL (ref 0.1–1.0)
Monocytes Relative: 8 %
Neutro Abs: 6.4 K/uL (ref 1.7–7.7)
Neutrophils Relative %: 84 %
Platelets: 313 K/uL (ref 150–400)
RBC: 3.48 MIL/uL — ABNORMAL LOW (ref 3.87–5.11)
RDW: 12.5 % (ref 11.5–15.5)
WBC: 7.6 K/uL (ref 4.0–10.5)
nRBC: 0 % (ref 0.0–0.2)

## 2024-03-04 LAB — LACTIC ACID, PLASMA
Lactic Acid, Venous: 1.8 mmol/L (ref 0.5–1.9)
Lactic Acid, Venous: 1.4 mmol/L (ref 0.5–1.9)

## 2024-03-04 LAB — COMPREHENSIVE METABOLIC PANEL WITH GFR
ALT: 12 U/L (ref 0–44)
AST: 23 U/L (ref 15–41)
Albumin: 3.7 g/dL (ref 3.5–5.0)
Alkaline Phosphatase: 80 U/L (ref 38–126)
Anion gap: 17 — ABNORMAL HIGH (ref 5–15)
BUN: 17 mg/dL (ref 8–23)
CO2: 20 mmol/L — ABNORMAL LOW (ref 22–32)
Calcium: 9.8 mg/dL (ref 8.9–10.3)
Chloride: 106 mmol/L (ref 98–111)
Creatinine, Ser: 0.82 mg/dL (ref 0.44–1.00)
GFR, Estimated: >60 mL/min
Glucose, Bld: 79 mg/dL (ref 70–99)
Potassium: 4.6 mmol/L (ref 3.5–5.1)
Sodium: 143 mmol/L (ref 135–145)
Total Bilirubin: 0.5 mg/dL (ref 0.0–1.2)
Total Protein: 8.1 g/dL (ref 6.5–8.1)

## 2024-03-04 LAB — RESP PANEL BY RT-PCR (RSV, FLU A&B, COVID)  RVPGX2
Influenza A by PCR: NEGATIVE
Influenza B by PCR: NEGATIVE
Resp Syncytial Virus by PCR: NEGATIVE
SARS Coronavirus 2 by RT PCR: NEGATIVE

## 2024-03-04 LAB — MAGNESIUM: Magnesium: 2.5 mg/dL — ABNORMAL HIGH (ref 1.7–2.4)

## 2024-03-04 LAB — PRO BRAIN NATRIURETIC PEPTIDE: Pro Brain Natriuretic Peptide: 192 pg/mL

## 2024-03-04 LAB — PROTIME-INR
INR: 1.2 (ref 0.8–1.2)
Prothrombin Time: 16.1 s — ABNORMAL HIGH (ref 11.4–15.2)

## 2024-03-04 MED ORDER — LEVETIRACETAM (KEPPRA) 500 MG/5 ML ADULT IV PUSH
500.0000 mg | Freq: Once | INTRAVENOUS | Status: AC
  Administered 2024-03-04: 500 mg via INTRAVENOUS
  Filled 2024-03-04: qty 5

## 2024-03-04 MED ORDER — LACTATED RINGERS IV BOLUS (SEPSIS)
500.0000 mL | Freq: Once | INTRAVENOUS | Status: AC
  Administered 2024-03-04: 500 mL via INTRAVENOUS

## 2024-03-04 MED ORDER — LEVETIRACETAM (KEPPRA) 500 MG/5 ML ADULT IV PUSH
1500.0000 mg | Freq: Once | INTRAVENOUS | Status: AC
  Administered 2024-03-04: 1500 mg via INTRAVENOUS
  Filled 2024-03-04: qty 15

## 2024-03-04 MED ORDER — LABETALOL HCL 5 MG/ML IV SOLN
5.0000 mg | Freq: Once | INTRAVENOUS | Status: AC
  Administered 2024-03-04: 5 mg via INTRAVENOUS
  Filled 2024-03-04: qty 4

## 2024-03-04 NOTE — ED Triage Notes (Signed)
 Pt in by [facility] from [facility]. Per facility and family pt has been altered and more lethargic since Sunday. Family stated to EMS she was normal on Saturday but was not on Sunday.

## 2024-03-04 NOTE — ED Provider Notes (Signed)
 " [place] [facility] AT [facility] Provider Note   CSN: [id number] Arrival date & time: 03/04/24  1125     Patient presents with: Altered Mental Status   Andrea Leblanc is a 74 y.o. female.   HPI Patient presents for altered mental status.  Medical history includes anemia, asthma, CKD, DM, SLE.  3 weeks ago, she was found down at home.  She was admitted to [facility] for subdural hematoma.  She underwent evacuation of left-sided hematoma on 12/24.  Subdural drain was placed.  Repeat CT scan on postop day 1 showed improved mass effect.  Drain was removed on postop day 2.  She made good progress with physical therapy at hospital while awaiting placement.  She is conversant at baseline.  She was discharged 5 days ago.  She arrives today from nursing facility.  Her family member noticed an change in her mental status 2 days ago.  EMS was called today due to ongoing altered mental state.    Prior to Admission medications  Medication Sig Start Date End Date Taking? Authorizing Provider  amLODipine (NORVASC) 5 MG tablet Take 5 mg by mouth daily.    [provider]  amoxicillin (AMOXIL) 875 MG tablet Take 875 mg by mouth 2 (two) times daily as needed (abscess).    [provider]  Darbepoetin Alfa  (ARANESP ) 100 MCG/0.5ML SOSY injection Inject 200 mcg into the skin every 6 (six) weeks. Patient not taking: Reported on 07/09/2023    Betsey Channel, MD  DULoxetine (CYMBALTA) 60 MG capsule Take 60 mg by mouth daily.    [provider]  epoetin alfa-epbx (RETACRIT) 10000 UNIT/ML injection 20,000 Units every 30 (thirty) days.    Betsey Channel, MD  furosemide (LASIX) 40 MG tablet Take 40 mg by mouth daily as needed for edema.    [provider]  gabapentin (NEURONTIN) 800 MG tablet Take 800 mg by mouth 3 (three) times daily.  11/27/19   [provider]  hydroxychloroquine (PLAQUENIL) 200 MG tablet Take 300 mg by mouth daily.     [provider]  ibuprofen  (ADVIL ) 800 MG tablet Take 1 tablet (800 mg total) by mouth every 6 (six) hours as needed. 05/11/22   Tobie Franky SQUIBB, DPM  meloxicam  (MOBIC ) 15 MG tablet Take 1 tablet (15 mg total) by mouth daily. 12/13/22   Tobie Franky SQUIBB, DPM  oxyCODONE -acetaminophen  (PERCOCET) 5-325 MG tablet Take 1 tablet by mouth every 4 (four) hours as needed for severe pain. 05/11/22   Tobie Franky SQUIBB, DPM  predniSONE (DELTASONE) 1 MG tablet Take 2 mg by mouth daily. Take with 5 mg to equal 7 mg daily Patient not taking: Reported on 07/09/2023    [provider]  predniSONE (DELTASONE) 5 MG tablet Take 5 mg by mouth daily. Take with 2 mg to equal 7 mg daily    [provider]  traMADol  (ULTRAM ) 50 MG tablet Take 25-50 mg by mouth 2 (two) times daily as needed for moderate pain. 01/08/20   [provider]  triamcinolone  cream (KENALOG) 0.1 % Apply 1 application  topically daily as needed (itching).    [provider]    Allergies: Lyrica [pregabalin]    Review of Systems  Unable to perform ROS: Mental status change    Updated Vital Signs BP (!) 141/78   Pulse 97   Temp 98 F (36.7 C) (Axillary)   Resp 16   Ht 5' 4 (1.626 m)   Wt 63  kg   SpO2 100%   BMI 23.84 kg/m   Physical Exam Vitals and nursing note reviewed.  Constitutional:      General: She is not in acute distress.    Appearance: Normal appearance. She is well-developed. She is ill-appearing. She is not toxic-appearing or diaphoretic.  HENT:     Head: Normocephalic and atraumatic.     Right Ear: External ear normal.     Left Ear: External ear normal.     Nose: Nose normal.     Mouth/Throat:     Mouth: Mucous membranes are dry.  Eyes:     Conjunctiva/sclera: Conjunctivae normal.  Cardiovascular:     Rate and Rhythm: Normal rate and regular rhythm.     Heart sounds: No murmur heard. Pulmonary:     Effort: Pulmonary effort is normal. No respiratory distress.     Breath  sounds: Normal breath sounds. No wheezing or rales.  Chest:     Chest wall: No tenderness.  Abdominal:     General: There is no distension.     Palpations: Abdomen is soft.     Tenderness: There is no abdominal tenderness.  Musculoskeletal:        General: No swelling. Normal range of motion.     Cervical back: Neck supple.  Skin:    General: Skin is warm and dry.     Coloration: Skin is not jaundiced or pale.  Neurological:     Mental Status: She is alert.     GCS: GCS eye subscore is 3. GCS verbal subscore is 4. GCS motor subscore is 6.     Motor: Weakness (Right hemibody) present.     (all labs ordered are listed, but only abnormal results are displayed) Labs Reviewed  COMPREHENSIVE METABOLIC PANEL WITH GFR - Abnormal; Notable for the following components:      Result Value   CO2 20 (*)    Anion gap 17 (*)    All other components within normal limits  CBC WITH DIFFERENTIAL/PLATELET - Abnormal; Notable for the following components:   RBC 3.48 (*)    Hemoglobin 11.2 (*)    HCT 35.5 (*)    MCV 102.0 (*)    Lymphs Abs 0.5 (*)    All other components within normal limits  BLOOD GAS, VENOUS - Abnormal; Notable for the following components:   pO2, Ven <31 (*)    Acid-base deficit 3.6 (*)    All other components within normal limits  PROTIME-INR - Abnormal; Notable for the following components:   Prothrombin Time 16.1 (*)    All other components within normal limits  MAGNESIUM - Abnormal; Notable for the following components:   Magnesium 2.5 (*)    All other components within normal limits  TROPONIN T, HIGH SENSITIVITY - Abnormal; Notable for the following components:   Troponin T High Sensitivity 26 (*)    All other components within normal limits  RESP PANEL BY RT-PCR (RSV, FLU A&B, COVID)  RVPGX2  CULTURE, BLOOD (ROUTINE X 2)  CULTURE, BLOOD (ROUTINE X 2)  LACTIC ACID, PLASMA  PRO BRAIN NATRIURETIC PEPTIDE  LACTIC ACID, PLASMA  URINALYSIS, W/ REFLEX TO CULTURE  (INFECTION SUSPECTED)  TROPONIN T, HIGH SENSITIVITY    EKG: EKG Interpretation Date/Time:  Tuesday March 04 2024 11:38:52 EST Ventricular Rate:  81 PR Interval:  217 QRS Duration:  162 QT Interval:  413 QTC Calculation: 480 R Axis:   -43  Text Interpretation: Sinus or ectopic atrial rhythm Borderline prolonged PR  interval Right bundle branch block Left ventricular hypertrophy Confirmed by Melvenia Motto [id number]) on 03/04/2024 11:57:08 AM  Radiology: CT Cervical Spine Wo Contrast Result Date: 03/04/2024 EXAM: CT CERVICAL SPINE WITHOUT CONTRAST 03/04/2024 12:28:24 PM TECHNIQUE: CT of the cervical spine was performed without the administration of intravenous contrast. Multiplanar reformatted images are provided for review. Automated exposure control, iterative reconstruction, and/or weight based adjustment of the mA/kV was utilized to reduce the radiation dose to as low as reasonably achievable. COMPARISON: None available. CLINICAL HISTORY: Neck pain, acute, no red flags. Acute neck pain without red flag symptoms. FINDINGS: BONES AND ALIGNMENT: There is mild straightening of the normal cervical lordosis. No acute fracture or traumatic malalignment. DEGENERATIVE CHANGES: There is mild chronic degenerative disc disease and facet arthrosis throughout the cervical spine, most pronounced at C5-C6, where there is mild-to-moderate central spinal canal stenosis. SOFT TISSUES: No prevertebral soft tissue swelling. LUNGS: There are biapical pulmonary blebs present, worse on the right. IMPRESSION: 1. Mild straightening of the normal cervical lordosis with chronic degenerative disc disease and facet arthrosis throughout the cervical spine, most pronounced at C5-6, where there is mild-to-moderate central spinal canal stenosis. 2. Incidental biapical pulmonary blebs, right greater than left. Electronically signed by: Evalene Coho MD MD 03/04/2024 12:41 PM EST RP Workstation: [id number]   CT Head Wo  Contrast Result Date: 03/04/2024 EXAM: CT HEAD WITHOUT CONTRAST 03/04/2024 12:28:24 PM TECHNIQUE: CT of the head was performed without the administration of intravenous contrast. Automated exposure control, iterative reconstruction, and/or weight based adjustment of the mA/kV was utilized to reduce the radiation dose to as low as reasonably achievable. COMPARISON: None available. CLINICAL HISTORY: The patient presents with a mental status change of unknown cause. FINDINGS: BRAIN AND VENTRICLES: Complex mixed attenuation, primarily hypodense fluid collection along left frontal convexity measuring up to 2.5 cm, likely subacute subdural hematoma. 1.2 cm rightward midline shift. Pneumocephalus. Effacement of left lateral ventricle with early entrapment of right lateral ventricle. No evidence of acute infarct. ORBITS: Bilateral lens replacement. SINUSES: No acute abnormality. SOFT TISSUES AND SKULL: Left frontal and parietal craniotomy. No acute soft tissue abnormality. No skull fracture. IMPRESSION: 1. Complex, likely subacute on chronic left frontal convexity subdural hematoma measuring up to 2.5 cm with associated 1.2 cm rightward midline shift, effacement of the left lateral ventricle, and early entrapment of the right lateral ventricle. 2. Pneumocephalus. 3. Status post left frontal and parietal craniotomy. Electronically signed by: Evalene Coho MD MD 03/04/2024 12:38 PM EST RP Workstation: [id number]   DG Chest Port 1 View Result Date: 03/04/2024 CLINICAL DATA:  Possible sepsis. EXAM: PORTABLE CHEST 1 VIEW COMPARISON:  02/01/2016, CT 02/06/2023 FINDINGS: Lungs are hypoinflated with mild opacification over the left base/retrocardiac region which may be due to small effusion with associated atelectasis although infection is possible. Evidence of known mild patchy fibrotic change. Cardiomediastinal silhouette and remainder of the exam is unchanged. IMPRESSION: Hypoinflation with mild opacification over the  left base/retrocardiac region which may be due to small effusion with associated atelectasis, although infection is possible. Electronically Signed   By: Toribio Agreste M.D.   On: 03/04/2024 12:13     Procedures   Medications Ordered in the ED  labetalol  (NORMODYNE ) injection 5 mg (has no administration in time range)  lactated ringers  bolus 500 mL (0 mLs Intravenous Stopped 03/04/24 1334)  levETIRAcetam  (KEPPRA ) undiluted injection 500 mg (500 mg Intravenous Given 03/04/24 1341)  levETIRAcetam  (KEPPRA ) undiluted injection 1,500 mg (1,500 mg Intravenous Given 03/04/24 1436)  Medical Decision Making Amount and/or Complexity of Data Reviewed Labs: ordered. Radiology: ordered.  Risk Prescription drug management.   This patient presents to the ED for concern of altered mental status, this involves an extensive number of treatment options, and is a complaint that carries with it a high risk of complications and morbidity.  The differential diagnosis includes ICH, polypharmacy, infection, metabolic derangements, CVA, seizure   Co morbidities / Chronic conditions that complicate the patient evaluation  anemia, asthma, CKD, DM, SLE   Additional history obtained:  Additional history obtained from EMR External records from outside source obtained and reviewed including EMS   Lab Tests:  I Ordered, and personally interpreted labs.  The pertinent results include: Baseline hemoglobin, no leukocytosis, normal kidney function, normal electrolytes   Imaging Studies ordered:  I ordered imaging studies including chest x-ray, CT of head and cervical spine I independently visualized and interpreted imaging which showed subacute on chronic left frontal SDH with 1.2 cm of rightward midline shift.  Pneumocephalus is present. I agree with the radiologist interpretation   Cardiac Monitoring: / EKG:  The patient was maintained on a cardiac monitor.  I  personally viewed and interpreted the cardiac monitored which showed an underlying rhythm of: Sinus rhythm   Problem List / ED Course / Critical interventions / Medication management  Patient presenting from nursing facility for altered mental status.  Per EMS, family reported that the change was on Sunday, 2 days ago.  On arrival, patient is GCS 13.  She is able to follow commands.  She is moving all extremities but does seem to have some right sided weakness.  This may be related to her recent left-sided subdural hematoma.  Her oral mucosa is dry.  She does endorse some neck pain.  EMS reports normal vital signs prior to arrival.  CBG was in the range of 70.  IV fluids were ordered.  Workup was initiated.  On patient CT scan, she does appear to have increased size of SDH from prior.  Per review of documentation from recent hospitalization, patient has 6 mm of midline shift at time of admission.  After hematoma evacuation, repeat CT scan showed improved midline shift, now 4 mm.  Today, patient has 1.2 cm midline shift.  Neurosurgery was consulted.  On reassessment, patient is actually slightly more awake than when she arrived.  Her lab work is unremarkable.  Dose of Keppra  was ordered for seizure prophylaxis.  I spoke with neurosurgery team, PA Johnanna, who advises transfer to [facility] where patient underwent her prior hematoma evacuation.  I spoke with Dr. Lansing with [facility] hospital neuro ICU.  He recommended the following: 2 g Keppra , BP less than 140, and Purdie 1 g transfer to [facility] hospital for direct admission.  Patient's blood pressure is currently slightly above 140.  Dose of labetalol  was ordered.  Additional Keppra  ordered.  Patient's only listed patient contact is her brother.  I attempted to call him but there was no answer.  Patient was transferred in stable condition. I ordered medication including IV fluids for hydration, Keppra  for seizure prophylaxis, labetalol  for  hypertension Reevaluation of the patient after these medicines showed that the patient improved I have reviewed the patients home medicines and have made adjustments as needed   Consultations Obtained:  I requested consultation with the neurosurgeons, PA Johnanna and Dr. Lansing,  and discussed lab and imaging findings as well as pertinent plan - they recommend: Transfer to [facility]   Social  Determinants of Health:  Resides in nursing facility  CRITICAL CARE Performed by: Bernardino Fireman   Total critical care time: 32 minutes  Critical care time was exclusive of separately billable procedures and treating other patients.  Critical care was necessary to treat or prevent imminent or life-threatening deterioration.  Critical care was time spent personally by me on the following activities: development of treatment plan with patient and/or surrogate as well as nursing, discussions with consultants, evaluation of patient's response to treatment, examination of patient, obtaining history from patient or surrogate, ordering and performing treatments and interventions, ordering and review of laboratory studies, ordering and review of radiographic studies, pulse oximetry and re-evaluation of patient's condition.      Final diagnoses:  SDH (subdural hematoma) [facility])    ED Discharge Orders     None          Fireman Bernardino, MD 03/04/24 1446  "

## 2024-03-04 NOTE — ED Notes (Signed)
 Brief changed ?

## 2024-03-04 NOTE — Discharge Instructions (Signed)
 Your CT scan today showed the following: Complex mixed attenuation, primarily hypodense fluid collection along left frontal convexity measuring up to 2.5 cm, likely subacute subdural hematoma. 1.2 cm rightward midline shift. Pneumocephalus. Effacement of left lateral ventricle with early entrapment of right lateral ventricle  You are being transferred to [facility] for management by your neurosurgery team.

## 2024-03-05 LAB — BLOOD CULTURE ID PANEL (REFLEXED) - BCID2

## 2024-03-05 NOTE — ED Notes (Signed)
 03/05/2024 @1219hrs .  CN received critical result for blood culture: Anerorobic bottle is positive fro gram positive Cocci. EDP made aware and requested CN to call [facility] and give information to the nurse taking care of the pt. CN called and spoke to Arland and gave her the critical result. [phone].

## 2024-03-05 NOTE — ED Notes (Signed)
 [facility] nurse Arland called back and CN gave her the critical lab result. 03/05/2024 @1751hrs .

## 2024-03-05 NOTE — ED Notes (Signed)
 Nurse received critical lab result. Nurse attempted to call and give it to [facility] staff x 6 times. Everytime the operator send CN to station the phone hangs up. CN attempting to tell them : The ID on blood cultures is : Epi Mec-ac. [id number] rm [id number], Edp aware        1624hrs 03/05/2024

## 2024-03-07 LAB — CULTURE, BLOOD (ROUTINE X 2)
Special Requests: ADEQUATE
Special Requests: ADEQUATE

## 2024-03-08 ENCOUNTER — Telehealth (HOSPITAL_BASED_OUTPATIENT_CLINIC_OR_DEPARTMENT_OTHER): Payer: Self-pay | Admitting: *Deleted

## 2024-03-08 NOTE — Telephone Encounter (Signed)
 Post ED Visit - Positive Culture Follow-up  Culture report reviewed by antimicrobial stewardship pharmacist: [facility] []  [place], Pharm.D. []  Venetia Gully, Pharm.D., BCPS AQ-ID []  Garrel Crews, Pharm.D., BCPS []  Almarie Lunger, Pharm.D., BCPS []  [place], [address].D., BCPS, AAHIVP []  Rosaline Bihari, Pharm.D., BCPS, AAHIVP []  Vernell Meier, PharmD, BCPS []  Latanya Hint, PharmD, BCPS []  Donald Medley, PharmD, BCPS []  Rocky Bold, PharmD []  Dorothyann Alert, PharmD, BCPS [x]  Dorn Buttner, PharmD  [facility] []  Rosaline Edison, PharmD []  Romona Bliss, PharmD []  Dolphus Roller, PharmD []  Veva Seip, Rph []  Vernell Daunt) Leonce, PharmD []  Eva Allis, PharmD []  Rosaline Millet, PharmD []  Iantha Batch, PharmD []  Arvin Gauss, PharmD []  Wanda Hasting, PharmD []  Ronal Rav, PharmD []  Rocky Slade, PharmD []  Bard Jeans, PharmD   Positive blood culture Pt is at [facility]. They are aware of cultures per pharmacist above. Pt on vanco there. No action needed  Andrea Leblanc 03/08/2024, 2:09 PM

## 2024-03-08 NOTE — Progress Notes (Signed)
 ED Antimicrobial Stewardship Positive Culture Follow Up   Andrea Leblanc is an 74 y.o. female who presented to [facility] on @ADMITDT @ with a chief complaint of  Chief Complaint  Patient presents with   Altered Mental Status    Recent Results (from the past 720 hours)  Resp panel by RT-PCR (RSV, Flu A&B, Covid) Anterior Nasal Swab     Status: None   Collection Time: 03/04/24 11:47 AM   Specimen: Anterior Nasal Swab  Result Value Ref Range Status   SARS Coronavirus 2 by RT PCR NEGATIVE NEGATIVE Final    Comment: (NOTE) SARS-CoV-2 target nucleic acids are NOT DETECTED.  The SARS-CoV-2 RNA is generally detectable in upper respiratory specimens during the acute phase of infection. The lowest concentration of SARS-CoV-2 viral copies this assay can detect is 138 copies/mL. A negative result does not preclude SARS-Cov-2 infection and should not be used as the sole basis for treatment or other patient management decisions. A negative result may occur with  improper specimen collection/handling, submission of specimen other than nasopharyngeal swab, presence of viral mutation(s) within the areas targeted by this assay, and inadequate number of viral copies(<138 copies/mL). A negative result must be combined with clinical observations, patient history, and epidemiological information. The expected result is Negative.  Fact Sheet for Patients:  bloggercourse.com  Fact Sheet for Healthcare Providers:  seriousbroker.it  This test is no t yet approved or cleared by the [place]  FDA and  has been authorized for detection and/or diagnosis of SARS-CoV-2 by FDA under an Emergency Use Authorization (EUA). This EUA will remain  in effect (meaning this test can be used) for the duration of the COVID-19 declaration under Section 564(b)(1) of the Act, 21 U.S.C.section 360bbb-3(b)(1), unless the authorization is terminated  or revoked sooner.        Influenza A by PCR NEGATIVE NEGATIVE Final   Influenza B by PCR NEGATIVE NEGATIVE Final    Comment: (NOTE) The Xpert Xpress SARS-CoV-2/FLU/RSV plus assay is intended as an aid in the diagnosis of influenza from Nasopharyngeal swab specimens and should not be used as a sole basis for treatment. Nasal washings and aspirates are unacceptable for Xpert Xpress SARS-CoV-2/FLU/RSV testing.  Fact Sheet for Patients: bloggercourse.com  Fact Sheet for Healthcare Providers: seriousbroker.it  This test is not yet approved or cleared by the [place]  FDA and has been authorized for detection and/or diagnosis of SARS-CoV-2 by FDA under an Emergency Use Authorization (EUA). This EUA will remain in effect (meaning this test can be used) for the duration of the COVID-19 declaration under Section 564(b)(1) of the Act, 21 U.S.C. section 360bbb-3(b)(1), unless the authorization is terminated or revoked.     Resp Syncytial Virus by PCR NEGATIVE NEGATIVE Final    Comment: (NOTE) Fact Sheet for Patients: bloggercourse.com  Fact Sheet for Healthcare Providers: seriousbroker.it  This test is not yet approved or cleared by the [place]  FDA and has been authorized for detection and/or diagnosis of SARS-CoV-2 by FDA under an Emergency Use Authorization (EUA). This EUA will remain in effect (meaning this test can be used) for the duration of the COVID-19 declaration under Section 564(b)(1) of the Act, 21 U.S.C. section 360bbb-3(b)(1), unless the authorization is terminated or revoked.  Performed at [facility], [address], [place], [place] [address]   Blood Culture (routine x 2)     Status: Abnormal   Collection Time: 03/04/24 12:10 PM   Specimen: BLOOD  Result Value Ref Range Status   Specimen  Description   Final    BLOOD RIGHT ANTECUBITAL Performed at [facility],  [address], [place], [place] [address]    Special Requests   Final    BOTTLES DRAWN AEROBIC AND ANAEROBIC Blood Culture adequate volume Performed at [facility], [address] [place], [place] [address]    Culture  Setup Time   Final    GRAM POSITIVE COCCI IN BOTH AEROBIC AND ANAEROBIC BOTTLES Gram Stain Report Called to,Read Back By and Verified With: LONG L. IN ED AT 1158 ON 03/04/24 BY THOMPSON S. CRITICAL RESULT CALLED TO, READ BACK BY AND VERIFIED WITH: RN FREDRIK LONG [id number] @ 1607 FH Performed at [facility], [address], [place], [place] [address]    Culture STAPHYLOCOCCUS EPIDERMIDIS (A)  Final   Report Status 03/07/2024 FINAL  Final   Organism ID, Bacteria STAPHYLOCOCCUS EPIDERMIDIS  Final      Susceptibility   Staphylococcus epidermidis - MIC*    CIPROFLOXACIN >=8 RESISTANT Resistant     ERYTHROMYCIN <=0.25 SENSITIVE Sensitive     GENTAMICIN <=0.5 SENSITIVE Sensitive     OXACILLIN >=4 RESISTANT Resistant     TETRACYCLINE 2 SENSITIVE Sensitive     VANCOMYCIN  <=0.5 SENSITIVE Sensitive     TRIMETH/SULFA 80 RESISTANT Resistant     CLINDAMYCIN <=0.25 SENSITIVE Sensitive     RIFAMPIN <=0.5 SENSITIVE Sensitive     Inducible Clindamycin NEGATIVE Sensitive     * STAPHYLOCOCCUS EPIDERMIDIS  Blood Culture ID Panel (Reflexed)     Status: Abnormal   Collection Time: 03/04/24 12:10 PM  Result Value Ref Range Status   Enterococcus faecalis NOT DETECTED NOT DETECTED Final   Enterococcus Faecium NOT DETECTED NOT DETECTED Final   Listeria monocytogenes NOT DETECTED NOT DETECTED Final   Staphylococcus species DETECTED (A) NOT DETECTED Final    Comment: CRITICAL RESULT CALLED TO, READ BACK BY AND VERIFIED WITH: RN L. LONG [id number] @ 1607 FH    Staphylococcus aureus (BCID) NOT DETECTED NOT DETECTED Final   Staphylococcus epidermidis DETECTED (A) NOT DETECTED Final    Comment: Methicillin (oxacillin) resistant coagulase negative staphylococcus. Possible blood culture contaminant  (unless isolated from more than one blood culture draw or clinical case suggests pathogenicity). No antibiotic treatment is indicated for blood  culture contaminants. CRITICAL RESULT CALLED TO, READ BACK BY AND VERIFIED WITH: RN L. LONG [id number] @ 1607 FH    Staphylococcus lugdunensis NOT DETECTED NOT DETECTED Final   Streptococcus species NOT DETECTED NOT DETECTED Final   Streptococcus agalactiae NOT DETECTED NOT DETECTED Final   Streptococcus pneumoniae NOT DETECTED NOT DETECTED Final   Streptococcus pyogenes NOT DETECTED NOT DETECTED Final   A.calcoaceticus-baumannii NOT DETECTED NOT DETECTED Final   Bacteroides fragilis NOT DETECTED NOT DETECTED Final   Enterobacterales NOT DETECTED NOT DETECTED Final   Enterobacter cloacae complex NOT DETECTED NOT DETECTED Final   Escherichia coli NOT DETECTED NOT DETECTED Final   Klebsiella aerogenes NOT DETECTED NOT DETECTED Final   Klebsiella oxytoca NOT DETECTED NOT DETECTED Final   Klebsiella pneumoniae NOT DETECTED NOT DETECTED Final   Proteus species NOT DETECTED NOT DETECTED Final   Salmonella species NOT DETECTED NOT DETECTED Final   Serratia marcescens NOT DETECTED NOT DETECTED Final   Haemophilus influenzae NOT DETECTED NOT DETECTED Final   Neisseria meningitidis NOT DETECTED NOT DETECTED Final   Pseudomonas aeruginosa NOT DETECTED NOT DETECTED Final   Stenotrophomonas maltophilia NOT DETECTED NOT DETECTED Final   Candida albicans NOT DETECTED NOT DETECTED Final  Candida auris NOT DETECTED NOT DETECTED Final   Candida glabrata NOT DETECTED NOT DETECTED Final   Candida krusei NOT DETECTED NOT DETECTED Final   Candida parapsilosis NOT DETECTED NOT DETECTED Final   Candida tropicalis NOT DETECTED NOT DETECTED Final   Cryptococcus neoformans/gattii NOT DETECTED NOT DETECTED Final   Methicillin resistance mecA/C DETECTED (A) NOT DETECTED Final    Comment: CRITICAL RESULT CALLED TO, READ BACK BY AND VERIFIED WITH: RN FREDRIK LONG [id number] @ 1607  FH Performed at [facility], [address], [place], [place] [address]   Blood Culture (routine x 2)     Status: Abnormal   Collection Time: 03/04/24 12:45 PM   Specimen: BLOOD  Result Value Ref Range Status   Specimen Description   Final    BLOOD LEFT ANTECUBITAL Performed at [facility], [address], [place], [place] [address]    Special Requests   Final    BOTTLES DRAWN AEROBIC ONLY Blood Culture adequate volume Performed at [facility], [address], [place], [place] [address]    Culture  Setup Time   Final    GRAM POSITIVE COCCI AEROBIC BOTTLE ONLY Gram Stain Report Called to,Read Back By and Verified With: CAVE H. AT [facility] AT 1416 ON [id number] BY THOMPSON S. CRITICAL VALUE NOTED.  VALUE IS CONSISTENT WITH PREVIOUSLY REPORTED AND CALLED VALUE.    Culture (A)  Final    STAPHYLOCOCCUS EPIDERMIDIS SUSCEPTIBILITIES PERFORMED ON PREVIOUS CULTURE WITHIN THE LAST 5 DAYS. Performed at [facility], [address], [place], [place] [address]    Report Status 03/07/2024 FINAL  Final    [x]  Patient admitted at [facility]. They are aware of both positive culture results from careeverywhere review. No action needed   Dorn Buttner, PharmD, BCPS 03/08/2024 10:48 AM ED Clinical Pharmacist -  [phone]
